# Patient Record
Sex: Male | Born: 1955 | Race: White | Hispanic: No | Marital: Married | Smoking: Former smoker
Health system: Southern US, Community
[De-identification: ages and names within clinical notes are randomized; demographics above are authoritative.]

## PROBLEM LIST (undated history)

## (undated) DIAGNOSIS — I219 Acute myocardial infarction, unspecified: Secondary | ICD-10-CM

## (undated) DIAGNOSIS — Z95810 Presence of automatic (implantable) cardiac defibrillator: Secondary | ICD-10-CM

## (undated) DIAGNOSIS — G1221 Amyotrophic lateral sclerosis: Secondary | ICD-10-CM

## (undated) DIAGNOSIS — I509 Heart failure, unspecified: Secondary | ICD-10-CM

## (undated) DIAGNOSIS — I10 Essential (primary) hypertension: Secondary | ICD-10-CM

## (undated) DIAGNOSIS — I519 Heart disease, unspecified: Secondary | ICD-10-CM

## (undated) DIAGNOSIS — I2510 Atherosclerotic heart disease of native coronary artery without angina pectoris: Secondary | ICD-10-CM

## (undated) DIAGNOSIS — N189 Chronic kidney disease, unspecified: Secondary | ICD-10-CM

## (undated) DIAGNOSIS — E785 Hyperlipidemia, unspecified: Secondary | ICD-10-CM

## (undated) DIAGNOSIS — I2589 Other forms of chronic ischemic heart disease: Secondary | ICD-10-CM

## (undated) DIAGNOSIS — C679 Malignant neoplasm of bladder, unspecified: Secondary | ICD-10-CM

## (undated) DIAGNOSIS — I209 Angina pectoris, unspecified: Secondary | ICD-10-CM

## (undated) HISTORY — DX: Essential (primary) hypertension: I10

## (undated) HISTORY — DX: Amyotrophic lateral sclerosis: G12.21

## (undated) HISTORY — DX: Other forms of chronic ischemic heart disease: I25.89

## (undated) HISTORY — DX: Hyperlipidemia, unspecified: E78.5

## (undated) HISTORY — PX: CARDIAC CATHETERIZATION: SHX172

## (undated) HISTORY — PX: OTHER SURGICAL HISTORY: SHX169

## (undated) HISTORY — DX: Atherosclerotic heart disease of native coronary artery without angina pectoris: I25.10

## (undated) HISTORY — DX: Heart failure, unspecified: I50.9

---

## 2001-03-29 DIAGNOSIS — I219 Acute myocardial infarction, unspecified: Secondary | ICD-10-CM

## 2001-03-29 HISTORY — DX: Acute myocardial infarction, unspecified: I21.9

## 2001-05-28 ENCOUNTER — Inpatient Hospital Stay (HOSPITAL_COMMUNITY): Admission: EM | Admit: 2001-05-28 | Discharge: 2001-06-04 | Payer: Self-pay

## 2004-08-28 ENCOUNTER — Ambulatory Visit: Payer: Self-pay | Admitting: Cardiology

## 2004-09-02 ENCOUNTER — Ambulatory Visit: Payer: Self-pay | Admitting: Cardiovascular Disease

## 2004-12-04 ENCOUNTER — Ambulatory Visit: Payer: Self-pay | Admitting: Cardiology

## 2005-07-28 ENCOUNTER — Ambulatory Visit: Payer: Self-pay | Admitting: Cardiology

## 2005-08-06 ENCOUNTER — Ambulatory Visit: Payer: Self-pay

## 2005-08-06 ENCOUNTER — Ambulatory Visit: Payer: Self-pay | Admitting: Cardiology

## 2006-08-03 ENCOUNTER — Ambulatory Visit: Payer: Self-pay | Admitting: Cardiology

## 2006-08-03 LAB — CONVERTED CEMR LAB
ALT: 23 units/L (ref 0–40)
AST: 29 units/L (ref 0–37)
Albumin: 3.9 g/dL (ref 3.5–5.2)
Alkaline Phosphatase: 57 units/L (ref 39–117)
BUN: 14 mg/dL (ref 6–23)
Bilirubin, Direct: 0.3 mg/dL (ref 0.0–0.3)
CO2: 26 meq/L (ref 19–32)
Calcium: 9.2 mg/dL (ref 8.4–10.5)
Chloride: 111 meq/L (ref 96–112)
Cholesterol: 135 mg/dL (ref 0–200)
Creatinine, Ser: 1.1 mg/dL (ref 0.4–1.5)
GFR calc Af Amer: 90 mL/min
GFR calc non Af Amer: 74 mL/min
Glucose, Bld: 92 mg/dL (ref 70–99)
HDL: 44.7 mg/dL (ref >39.0)
LDL Cholesterol: 71 mg/dL (ref 0–99)
Potassium: 4.6 meq/L (ref 3.5–5.1)
Sodium: 143 meq/L (ref 135–145)
Total Bilirubin: 1.3 mg/dL — ABNORMAL HIGH (ref 0.3–1.2)
Total CHOL/HDL Ratio: 3
Total Protein: 7.2 g/dL (ref 6.0–8.3)
Triglycerides: 96 mg/dL (ref 0–149)
VLDL: 19 mg/dL (ref 0–40)

## 2006-10-12 ENCOUNTER — Ambulatory Visit: Payer: Self-pay

## 2006-12-05 ENCOUNTER — Ambulatory Visit: Payer: Self-pay | Admitting: Internal Medicine

## 2007-08-24 ENCOUNTER — Ambulatory Visit: Payer: Self-pay | Admitting: Cardiology

## 2007-08-24 LAB — CONVERTED CEMR LAB
ALT: 28 units/L (ref 0–53)
AST: 28 units/L (ref 0–37)
Albumin: 3.8 g/dL (ref 3.5–5.2)
Alkaline Phosphatase: 66 units/L (ref 39–117)
BUN: 13 mg/dL (ref 6–23)
Bilirubin, Direct: 0.1 mg/dL (ref 0.0–0.3)
CO2: 27 meq/L (ref 19–32)
Calcium: 9.4 mg/dL (ref 8.4–10.5)
Chloride: 101 meq/L (ref 96–112)
Cholesterol: 137 mg/dL (ref 0–200)
Creatinine, Ser: 1 mg/dL (ref 0.4–1.5)
GFR calc Af Amer: 100 mL/min
GFR calc non Af Amer: 83 mL/min
Glucose, Bld: 98 mg/dL (ref 70–99)
HDL: 33.4 mg/dL — ABNORMAL LOW (ref >39.0)
LDL Cholesterol: 80 mg/dL (ref 0–99)
Potassium: 3.9 meq/L (ref 3.5–5.1)
Sodium: 138 meq/L (ref 135–145)
Total Bilirubin: 1.1 mg/dL (ref 0.3–1.2)
Total CHOL/HDL Ratio: 4.1
Total Protein: 7.1 g/dL (ref 6.0–8.3)
Triglycerides: 119 mg/dL (ref 0–149)
VLDL: 24 mg/dL (ref 0–40)

## 2007-08-30 ENCOUNTER — Ambulatory Visit: Payer: Self-pay

## 2008-03-29 HISTORY — PX: HERNIA REPAIR: SHX51

## 2008-11-20 DIAGNOSIS — E785 Hyperlipidemia, unspecified: Secondary | ICD-10-CM | POA: Insufficient documentation

## 2008-11-20 DIAGNOSIS — I10 Essential (primary) hypertension: Secondary | ICD-10-CM

## 2008-11-20 DIAGNOSIS — I509 Heart failure, unspecified: Secondary | ICD-10-CM | POA: Insufficient documentation

## 2008-11-20 DIAGNOSIS — I2510 Atherosclerotic heart disease of native coronary artery without angina pectoris: Secondary | ICD-10-CM | POA: Insufficient documentation

## 2008-11-25 ENCOUNTER — Encounter (INDEPENDENT_AMBULATORY_CARE_PROVIDER_SITE_OTHER): Payer: Self-pay | Admitting: *Deleted

## 2009-01-10 ENCOUNTER — Telehealth: Payer: Self-pay | Admitting: Cardiology

## 2009-03-19 ENCOUNTER — Telehealth: Payer: Self-pay | Admitting: Cardiology

## 2009-07-09 ENCOUNTER — Encounter: Payer: Self-pay | Admitting: Cardiology

## 2009-08-21 ENCOUNTER — Ambulatory Visit: Payer: Self-pay | Admitting: Cardiology

## 2009-08-21 DIAGNOSIS — I428 Other cardiomyopathies: Secondary | ICD-10-CM | POA: Insufficient documentation

## 2009-08-21 DIAGNOSIS — I2589 Other forms of chronic ischemic heart disease: Secondary | ICD-10-CM

## 2010-01-28 ENCOUNTER — Encounter: Payer: Self-pay | Admitting: Cardiology

## 2010-01-30 ENCOUNTER — Encounter: Payer: Self-pay | Admitting: Gastroenterology

## 2010-02-10 ENCOUNTER — Telehealth: Payer: Self-pay | Admitting: Gastroenterology

## 2010-02-25 ENCOUNTER — Telehealth: Payer: Self-pay | Admitting: Cardiology

## 2010-02-27 ENCOUNTER — Encounter: Payer: Self-pay | Admitting: Cardiology

## 2010-03-04 ENCOUNTER — Telehealth (INDEPENDENT_AMBULATORY_CARE_PROVIDER_SITE_OTHER): Payer: Self-pay

## 2010-03-05 ENCOUNTER — Ambulatory Visit: Payer: Self-pay

## 2010-03-05 ENCOUNTER — Encounter: Payer: Self-pay | Admitting: Internal Medicine

## 2010-03-05 ENCOUNTER — Encounter (HOSPITAL_COMMUNITY)
Admission: RE | Admit: 2010-03-05 | Discharge: 2010-04-28 | Payer: Self-pay | Source: Home / Self Care | Attending: Cardiology | Admitting: Cardiology

## 2010-03-12 ENCOUNTER — Ambulatory Visit: Admission: RE | Admit: 2010-03-12 | Payer: Self-pay | Source: Home / Self Care | Admitting: Surgery

## 2010-03-16 ENCOUNTER — Ambulatory Visit (HOSPITAL_COMMUNITY)
Admission: RE | Admit: 2010-03-16 | Discharge: 2010-03-16 | Payer: Self-pay | Source: Home / Self Care | Attending: Surgery | Admitting: Surgery

## 2010-03-16 ENCOUNTER — Ambulatory Visit (HOSPITAL_COMMUNITY): Admission: RE | Admit: 2010-03-16 | Payer: Self-pay | Source: Home / Self Care | Admitting: Surgery

## 2010-04-03 ENCOUNTER — Encounter: Payer: Self-pay | Admitting: Cardiology

## 2010-04-30 NOTE — Progress Notes (Signed)
Summary: Nuc. Pre Procedure  Phone Note Outgoing Call Call back at Home Phone [phone]   Call placed by: Irean Hong, RN,  March 04, 2010 2:29 PM Summary of Call: Left message with information on Myoview Information Sheet (see scanned document for details).      Nuclear Med Background Indications for Stress Test: Evaluation for Ischemia, Surgical Clearance, PTCA Patency  Indications Comments: Pending hernia repair by Dr. Cyndia Bent.  History: Angioplasty, Echo, Heart Catheterization, Myocardial Infarction, Myocardial Perfusion Study  History Comments: '03 AWMI>Cath>PTCA x2 LAD,Diag. '03 Echo: EF=35-45%. '07 Muga: EF=40%. 7/08MPS: Large anterapical and anteroseptal infarct,(-) ischemia,EF=33%. Hx. ICM     Nuclear Pre-Procedure Cardiac Risk Factors: History of Smoking, Hypertension, Lipids Height (in): 68   Appended Document: Cardiology Nuclear Testing Left message to call back

## 2010-04-30 NOTE — Assessment & Plan Note (Signed)
Summary: Cardiology Nuclear Testing  Nuclear Med Background Indications for Stress Test: Evaluation for Ischemia, Surgical Clearance, PTCA Patency  Indications Comments: Pending hernia repair by Dr. Cyndia Bent.  History: Angioplasty, Echo, Heart Catheterization, Myocardial Infarction, Myocardial Perfusion Study  History Comments: '03 AWMI>Cath>PTCA x2 LAD,Diag. '03 Echo: EF=35-45%. '07 Muga: EF=40%. 7/08MPS: Large anterapical and anteroseptal infarct,(-) ischemia,EF=33%. Hx. ICM     Nuclear Pre-Procedure Cardiac Risk Factors: History of Smoking, Hypertension, Lipids Caffeine/Decaff Intake: None NPO After: 5:30 AM Lungs: clear IV 0.9% NS with Angio Cath: 22g     IV Site: R Antecubital IV Started by: Bonnita Levan, RN Chest Size (in) 40     Height (in): 68 Weight (lb): 172 BMI: 26.25  Nuclear Med Study 1 or 2 day study:  1 day     Stress Test Type:  Stress Reading MD:  Arvilla Meres, MD     Referring MD:  B.Crenshaw Resting Radionuclide:  Technetium 22m Tetrofosmin     Resting Radionuclide Dose:  11 mCi  Stress Radionuclide:  Technetium 56m Tetrofosmin     Stress Radionuclide Dose:  33 mCi   Stress Protocol Exercise Time (min):  7:30 min     Max HR:  151 bpm     Predicted Max HR:  164 bpm  Max Systolic BP: 150 mm Hg     Percent Max HR:  92.07 %     METS: 9.3 Rate Pressure Product:  16109    Stress Test Technologist:  Milana Na, EMT-P     Nuclear Technologist:  Doyne Keel, CNMT  Rest Procedure  Myocardial perfusion imaging was performed at rest 45 minutes following the intravenous administration of Technetium 88m Tetrofosmin.  Stress Procedure  The patient exercised for 7:30. The patient stopped due to fatigue and denied any chest pain.  There were + significant ST-T wave changes.  Technetium 3m Tetrofosmin was injected at peak exercise and myocardial perfusion imaging was performed after a brief delay.  QPS Raw Data Images:  Normal; no motion artifact;  dilated LV Stress Images:  Markedly decreased uptake in the anterior, antero-apical and anteroseptal walls Rest Images:  Markedly decreased uptake in the anterior, antero-apical and anteroseptal walls Subtraction (SDS):  Previous large anterior, antero-apical and anteroseptal infarct. No ischemia. Transient Ischemic Dilatation:  1.10  (Normal <1.22)  Lung/Heart Ratio:  .35  (Normal <0.45)  Quantitative Gated Spect Images QGS EDV:  171 ml QGS ESV:  124 ml QGS EF:  27 % QGS cine images:  Previous large anterior, antero-apical and anteroseptal infarct. Apical dyskinesis.   Findings Abormal nuclear study  Evidence for anterior (septal apical) infarct  Evidence for LV Dysfunction LV Dysfunction    Overall Impression  Exercise Capacity: Fair exercise capacity. BP Response: Normal blood pressure response. Clinical Symptoms: No chest pain ECG Impression: No significant ST segment change suggestive of ischemia. Overall Impression: Abnormal stress nuclear study. Overall Impression Comments: Previous large anterior, antero-apical and anteroseptal infarct. No ischemia. LV is dilated. EF 27%.  Appended Document: Cardiology Nuclear Testing Would ask pt to f/u with Dr. Ladona Ridgel for consideration of ICD  Appended Document: Cardiology Nuclear Testing Left message to call back    Appended Document: Cardiology Nuclear Testing Left message to call back    Appended Document: Cardiology Nuclear Testing pt aware of results, he will call after surgery to get appt with dr taylor

## 2010-04-30 NOTE — Assessment & Plan Note (Signed)
Summary: rov per pt call/lg    Primary Provider:  Dr. Christell Constant   History of Present Illness: [name] [name] is a pleasant gentleman who has a history of coronary artery disease.  He had an acute anterior infarct in 2003.  At that time, he had PCI of his LAD and diagonal.  Note, the Lcx and RCA had no disease. His last myoview on October 12, 2006, his ejection fraction was noted to be 33%.  There was a large anteroapical and anteroseptal infarct, but there was no ischemia.  We did refer him to Dr. Ladona Ridgel for consideration of ICD.  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  He declined it at that point.  Carotid Dopplers in June of 2009 showed normal carotids. I last saw him in May 2009. Since then, the patient denies any dyspnea on exertion, orthopnea, PND, pedal edema, palpitations, syncope or chest pain. He also has an inguinal hernia. We were also asked to evaluate preoperatively.   Current Medications (verified): 1)  Aspirin Ec 325 Mg Tbec (Aspirin) .... Take One Tablet By Mouth Daily 2)  Altace 5 Mg Caps (Ramipril) .Marland Kitchen.. 1 Tab By Mouth Two Times A Day 3)  Carvedilol 6.25 Mg Tabs (Carvedilol) .... Take One Tablet By Mouth Twice A Day 4)  Crestor 20 Mg Tabs (Rosuvastatin Calcium) .... Take One Tablet By Mouth Daily.  Past History:  Past Medical History: HYPERTENSION (ICD-401.9) HYPERLIPIDEMIA (ICD-272.4) CARDIOMYOPATHY (ICD-425.4) CAD (ICD-414.00)  Past Surgical History: Previous ear surgery  Social History: Reviewed history from 11/20/2008 and no changes required. The patient works as an Psychologist, clinical.  He denies tobacco   or ethanol abuse at present.   Review of Systems       no fevers or chills, productive cough, hemoptysis, dysphasia, odynophagia, melena, hematochezia, dysuria, hematuria, rash, seizure activity, orthopnea, PND, pedal edema, claudication. Remaining systems are negative.   Vital Signs:  Patient profile:   55 year  old male Height:      68 inches Weight:      176 pounds BMI:     26.86 Pulse rate:   63 / minute Resp:     12 per minute BP sitting:   122 / 80  (left arm)  Vitals Entered By: Kem Parkinson (Aug 21, 2009 3:59 PM)  Physical Exam  General:  Well-developed well-nourished in no acute distress.  Skin is warm and dry.  HEENT is normal.  Neck is supple. No thyromegaly.  Chest is clear to auscultation with normal expansion.  Cardiovascular exam is regular rate and rhythm.  Abdominal exam nontender or distended. No masses palpated. Extremities show no edema. neuro grossly intact    EKG  Procedure date:  08/21/2009  Findings:      Sinus rhythm at a rate of 63. Axis normal.  Prior septal infarct.  Impression & Recommendations:  Problem # 1:  CARDIOMYOPATHY, ISCHEMIC (ICD-414.8) Continue aspirin, ACE inhibitor, beta blocker and statin. Repeat Myoview both to exclude ischemia and to quantitate LV function. If ejection fraction less than or equal to 35% then I would recommend ICD. He is somewhat hesitant about this but states he would consider it. I discussed the risk of sudden death. His updated medication list for this problem includes:    Aspirin Ec 325 Mg Tbec (Aspirin) .Marland Kitchen... Take one tablet by mouth daily    Altace 5 Mg Caps (Ramipril) .Marland Kitchen... 1 tab by mouth two times a day  Carvedilol 6.25 Mg Tabs (Carvedilol) .Marland Kitchen... Take one tablet by mouth twice a day  Problem # 2:  PREOPERATIVE EXAMINATION (ICD-V72.84) Plan Myoview. If no ischemia or low risk then I think it would be safe for him to proceed.  Problem # 3:  HYPERTENSION (ICD-401.9) Blood pressure controlled on present medications. Will continue. Check bmet. His updated medication list for this problem includes:    Aspirin Ec 325 Mg Tbec (Aspirin) .Marland Kitchen... Take one tablet by mouth daily    Altace 5 Mg Caps (Ramipril) .Marland Kitchen... 1 tab by mouth two times a day    Carvedilol 6.25 Mg Tabs (Carvedilol) .Marland Kitchen... Take one tablet by mouth  twice a day  Problem # 4:  HYPERLIPIDEMIA (ICD-272.4) Continue statin. Check lipids and liver. His updated medication list for this problem includes:    Crestor 20 Mg Tabs (Rosuvastatin calcium) .Marland Kitchen... Take one tablet by mouth daily.  Problem # 5:  CAD (ICD-414.00) Continue present medications as well as risk factor modification. His updated medication list for this problem includes:    Aspirin Ec 325 Mg Tbec (Aspirin) .Marland Kitchen... Take one tablet by mouth daily    Altace 5 Mg Caps (Ramipril) .Marland Kitchen... 1 tab by mouth two times a day    Carvedilol 6.25 Mg Tabs (Carvedilol) .Marland Kitchen... Take one tablet by mouth twice a day  Orders: Nuclear Stress Test (Nuc Stress Test)  Patient Instructions: 1)  Your physician recommends that you schedule a follow-up appointment in: ONE YEAR 2)  Your physician recommends that you return for lab work WJ:[id number] STRESS TEST-LIPID/LIVER/BMP-272.0/V58.69/414.8 3)  Your physician has requested that you have an exercise stress myoview.  For further information please visit [email or web address]  Please follow instruction sheet, as given.

## 2010-04-30 NOTE — Progress Notes (Signed)
Summary: Office Visit  Office Visit   Imported By: Nosson Many 09/03/2009 14:52:33  _____________________________________________________________________  External Attachment:    Type:   Image     Comment:   External Document

## 2010-04-30 NOTE — Letter (Signed)
Summary: Pre Visit Letter Revised  [facility]  [address] [place], [place] [address]   Phone: [phone]  Fax: [phone]        01/30/2010 MRN: [id number]  [name] [name] [address] [place], [place]  [address]             Procedure Date:  12-21 at 9:30am           Dr Nedra Hai to the Gastroenterology Division at [facility].    You are scheduled to see a nurse for your pre-procedure visit on 03-04-10 at  3pm on the 3rd floor at [facility], [address].  We ask that you try to arrive at our office 15 minutes prior to your appointment time to allow for check-in.  Please take a minute to review the attached form.  If you answer "Yes" to one or more of the questions on the first page, we ask that you call the person listed at your earliest opportunity.  If you answer "No" to all of the questions, please complete the rest of the form and bring it to your appointment.    Your nurse visit will consist of discussing your medical and surgical history, your immediate family medical history, and your medications.   If you are unable to list all of your medications on the form, please bring the medication bottles to your appointment and we will list them.  We will need to be aware of both prescribed and over the counter drugs.  We will need to know exact dosage information as well.    Please be prepared to read and sign documents such as consent forms, a financial agreement, and acknowledgement forms.  If necessary, and with your consent, a friend or relative is welcome to sit-in on the nurse visit with you.  Please bring your insurance card so that we may make a copy of it.  If your insurance requires a referral to see a specialist, please bring your referral form from your primary care physician.  No co-pay is required for this nurse visit.     If you cannot keep your appointment, please call [phone] to cancel or reschedule prior to your  appointment date.  This allows [place] the opportunity to schedule an appointment for another patient in need of care.    Thank you for choosing [facility] for your medical needs.  We appreciate the opportunity to care for you.  Please visit [place] at our website  to learn more about our practice.  Sincerely, The Gastroenterology Division

## 2010-04-30 NOTE — Progress Notes (Signed)
Summary: Question   Phone Note Call from Patient Call back at [phone]   Caller: Patient Call For: Dr. Arlyce Dice Reason for Call: Talk to Nurse Details for Reason: Question Summary of Call: Pt. has previsit & colon scheduled.  He has an upcoming visit with his surgeon 12/2 to schedule hernia repair.  Questioned whether or not this would interfere with colonoscopy or whether he should put it off until after his hernia surgery. Please call and advise. Initial call taken by: Schuyler Amor,  February 10, 2010 11:26 AM  Follow-up for Phone Call        I have left a message for the patient that he should see the surgeon and if they can schedule his hernia repair prior to the colon then he can reschedule at his recommendations.  I have asked him to call back if he has any questions. Follow-up by: Darcey Nora RN, CGRN,  February 10, 2010 1:44 PM

## 2010-04-30 NOTE — Progress Notes (Signed)
Summary: dr streck's office re cardiac clearance   Phone Note From Other Clinic   Caller: dr streck's office [phone] Summary of Call: calling to see if we have done a cardiac clearance for pt's hernia surgery, also need to know how to manage his blood thinners pls fax to [phone] Initial call taken by: Glynda Jaeger,  February 25, 2010 10:42 AM  Follow-up for Phone Call        pt was seen by dr Jens Som for surgical clearence in may and a myoview was requested.and the pt cancelled. per dr Jens Som pt will need a myoview before can give clearence. dr streck's office aware. left message for pt to call back to disucss Deliah Goody, RN  February 25, 2010 11:05 AM   Additional Follow-up for Phone Call Additional follow up Details #1::        spoke with pt, myoview scheduled for 03-05-10 foer surgical clearence Deliah Goody, RN  February 26, 2010 10:29 AM

## 2010-04-30 NOTE — Letter (Signed)
Summary: [facility]- Office Visit  [facility]- Office Visit   Imported By: Marylou Mccoy 04/06/2010 15:36:33  _____________________________________________________________________  External Attachment:    Type:   Image     Comment:   External Document

## 2010-05-06 NOTE — Letter (Signed)
Summary: Dr Reola Mosher Streck's Office Note   Dr Reola Mosher Streck's Office Note   Imported By: Roderic Ovens 05/01/2010 12:40:23  _____________________________________________________________________  External Attachment:    Type:   Image     Comment:   External Document

## 2010-06-08 LAB — BASIC METABOLIC PANEL
BUN: 6 mg/dL (ref 6–23)
CO2: 28 mEq/L (ref 19–32)
Calcium: 9.4 mg/dL (ref 8.4–10.5)
Chloride: 104 mEq/L (ref 96–112)
Creatinine, Ser: 0.92 mg/dL (ref 0.4–1.5)
GFR calc Af Amer: >60 mL/min (ref >60)
GFR calc non Af Amer: >60 mL/min (ref >60)
Glucose, Bld: 110 mg/dL — ABNORMAL HIGH (ref 70–99)
Potassium: 3.9 mEq/L (ref 3.5–5.1)
Sodium: 139 mEq/L (ref 135–145)

## 2010-06-08 LAB — CBC
HCT: 42.6 % (ref 39.0–52.0)
MCH: 31.5 pg (ref 26.0–34.0)
MCHC: 34 g/dL (ref 30.0–36.0)
MCV: 92.4 fL (ref 78.0–100.0)
Platelets: 214 10*3/uL (ref 150–400)
RDW: 12.7 % (ref 11.5–15.5)
WBC: 5.3 10*3/uL (ref 4.0–10.5)

## 2010-08-11 NOTE — Assessment & Plan Note (Signed)
[facility]                            CARDIOLOGY OFFICE NOTE   NAME:[name] [name], [name] [name]                       MRN:          [id number]  DATE:08/24/2007                            DOB:          December 06, 1955    [name] [name] is a 55 year old gentleman who has a history of coronary  artery disease.  He had an acute anterior infarct in 2003.  At that  time, he had PCI of his LAD and diagonal.  When I last saw him on October 12, 2006, his ejection fraction was noted to be 33%.  There was a large  anteroapical and anteroseptal infarct, but there was no ischemia.  We  did refer him to Dr. Ladona Ridgel for consideration of ICD.  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  He declined it at that point.  Since I last saw him, he has  had an occasional tingling in his hands bilaterally.  However, there has  been no weakness or loss of sensation in his extremities.  There is no  dysarthria.  He occasionally has mild dyspnea on exertion but there is  no orthopnea, PND, or pedal edema.  He has not had chest pain,  palpitations, or syncope.  His medications include aspirin 325 mg p.o.  daily, Altace 5 mg p.o. b.i.d., Coreg 6.25 mg p.o. b.i.d., and Vytorin  10/40 mg daily.   PHYSICAL EXAMINATION:  VITAL SIGNS:  Blood pressure 118/80, and pulse of  61, and weight is 177 pounds.  HEENT:  Normal.  NECK:  Supple with no bruits.  CHEST:  Clear.  CARDIOVASCULAR:  Regular rhythm.  ABDOMEN:  No tenderness.  EXTREMITIES:  No edema.   LABS:  Electrocardiogram shows a sinus rhythm at a rate of 61.  There is  a prior septal infarct.  It is unchanged from previous.   DIAGNOSES:  1. Coronary artery disease, status post percutaneous coronary      intervention of the left anterior descending and diagonal - he will      continue on his aspirin, Vytorin, Coreg and ACE inhibitor.  He will      continue with diet and exercise.  He does not  smoke.  2. Ischemic cardiomyopathy - his ejection fraction was less than 35%      on recent evaluation.  We again discussed the possibility of an      implantable cardioverter-defibrillator.  However, he is concerned      about his insurance and time off from work.  I explained the risk      of sudden death.  He will contact us if and when he is agreeable to      an implantable cardioverter-defibrillator.  3. Hypertension - his blood pressure is controlled on his present      medications.  We will check BMET to follow his potassium and renal      function.  4. Hyperlipidemia - we will continue on his statin, and we will check      lipids  and liver and adjust as indicated.  5. Numbness in his upper extremities - we will check carotid Dopplers,      although this does not      necessarily sound to be a transient ischemic attack.  6. We will see him back in 1 year.     [name] Jens Som, MD, [facility]  Electronically Signed    BSC/MedQ  DD: 08/24/2007  DT: 08/24/2007  Job #: [id number]

## 2010-08-11 NOTE — Assessment & Plan Note (Signed)
[facility]                         ELECTROPHYSIOLOGY OFFICE NOTE   NAME:Minteer, BRYSYN BRANDENBERGER                       MRN:          [id number]  DATE:12/05/2006                            DOB:          29-Nov-1955    [name] is referred today for evaluation.  He is a very pleasant  middle-aged man with an ischemic cardiomyopathy status post myocardial  infarction with class I to II congestive heart failure.  The patient has  never had syncope.  He recently underwent stress Myoview study which  demonstrated a gated EF of 33% and is referred now for consideration for  prophylactic ICD insertion.  The patient has never had syncope.  He  denies palpitations or chest pain.  His heart failure has been very well  controlled.   MEDICATIONS:  1. Enteric-coated aspirin 325 mg a day.  2. Altace 5 mg twice a day.  3. Coreg 6.25 mg twice daily.  4. Vytorin.   PAST MEDICAL HISTORY:  Notable for dyslipidemia.   FAMILY HISTORY:  Positive for coronary artery disease.   SOCIAL HISTORY:  The patient works as an Psychologist, clinical.  He denies tobacco  or ethanol abuse at present.   REVIEW OF SYSTEMS:  Notable for minimal amounts of arthritis, otherwise  all systems were reviewed and negative except as noted in the HPI.   PHYSICAL EXAMINATION:  GENERAL:  He is a pleasant, well appearing,  middle-aged man in no acute distress.  VITAL SIGNS:  Blood pressure 110/82, pulse 76 and regular, respirations  16, weight 181 pounds.  HEENT:  Normocephalic and atraumatic.  Pupils equal, round, and reactive  to light.  Oropharynx was moist.  Sclerae anicteric.  NECK:  No jugular venous distention.  There was no thyromegaly.  Trachea  was midline.  Carotids were 2+ and symmetric.  LUNGS:  Clear to auscultation bilaterally.  No wheezes, rales, or  rhonchi were present.  There is no increased work of breathing.  CARDIOVASCULAR:  Regular rate and rhythm with normal S1 and S2.  PMI was  enlarged and laterally displaced.  There was no S3 or S4 gallop  appreciated.  ABDOMEN:  Soft, nontender, and nondistended.  No organomegaly.  Bowel  sounds present.  No rebound or guarding.  EXTREMITIES:  No cyanosis, clubbing, or edema.  Pulses 2+ and symmetric.  NEUROLOGY:  Alert and oriented x3.  Cranial nerves II-XII grossly  intact.  The strength is 5/5 and symmetric.   EKG demonstrates sinus rhythm.   IMPRESSION:  1. Ischemic cardiomyopathy.  2. Congestive heart failure class I to II.   DISCUSSION:  I have discussed treatment options with the patient.  The  risks and benefits, goals and expectations of prophylactic ICD insertion  have been discussed with the patient.  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  Because of this, he would not be able to continue his job  and have a defibrillator.  Despite his risks for  sudden death, the  patient would prefer to continue working at the present time.  I have  instructed him that should he have any high risk features including  syncope or sustained tachypalpitations associated with dizziness or  lightheadedness that he should call us, or go to the emergency room.     Doylene Canning. Ladona Ridgel, MD  Electronically Signed    GWT/MedQ  DD: 12/05/2006  DT: 12/06/2006  Job #: [id number]   cc:   [name], MD, [facility]  Ernestina Penna, M.D.

## 2010-08-14 NOTE — Cardiovascular Report (Signed)
[name]. [facility]  Patient:    [name] [name], [name] [name] Visit Number: [id number] MRN: [id number]          Service Type: MED Location: CCUA 2930 01 Attending Physician:  [name] [name] Dictated by:   [name] [name], M.D. [facility] Proc. Date: 05/28/01 Admit Date:  05/28/2001   CC:         [name] [name]. [name] [name], M.D. [facility]  Cardiopulmonary Laboratory   Cardiac Catheterization  PROCEDURES PERFORMED: Cardiac catheterization and percutaneous coronary intervention.  CLINICAL HISTORY: The patient is 55 years old and had no prior history of known heart disease. At 11 this morning, he developed severe chest pain and came to the emergency room where his ECG showed an acute anterior wall myocardial infarction. He was seen by Dr. Jens [name] and taken to the catheterization laboratory for angiography and probable intervention. He was consent to the [facility] trial.  DESCRIPTION OF PROCEDURE: The procedure was performed via the right femoral artery using an arterial sheath and 6 French preformed coronary catheters.  A front wall arterial puncture was performed and Omnipaque contrast was used. The patient was given weight-adjusted heparin to prolong the ACT of greater than 200 seconds. He was given abciximab bolus and infusion, although this did not get started until after the first aspiration with the Export catheter.  We used a 7 [place] guiding catheter with side holes. The patient was randomized to distal protection with the GuardWire balloon. We passed the GuardWire balloon across the lesion and still only had TIMI-1 flow. We performed two aspirations with the Export catheter and still this only had TIMI-1 flow. We then inflated the PercuSurge balloon and went in with a 2.25 x 20 mm CrossSail and performed one inflation of 8 atmospheres for 45 seconds. We then removed the balloon and used the Export catheter to aspirate the area of dilatation. We performed three  aspirations. We then deflated the PercuSurge balloon. This reestablished TIMI-3 flow and recognized there was a branch stenosis with a high-grade lesion in the diagonal branch. We then wired the diagonal branch before performing repeat balloon inflations. We then went back in with a 2.5 x 20 mm CrossSail. We attempted to inflate the GuardWire balloon, but the GuardWire balloon would not inflate. We went ahead and proceeded with dilatation of the LAD performing two inflations of 8 and 10 atmospheres for 50 and 33 seconds. We then removed the balloon and went back in with the same balloon which was a 2.5 x 20 mm CrossSail and dilated in the diagonal lesion with two inflations of 8 atmospheres for 32 seconds each. We were left with a suboptimal result and possible filling defect and elected to pass the Export catheter into the diagonal branch and we performed one aspiration with several movements across the lesion. Repeat diagnostic study did not show too much additional improvement after aspiration with the Export catheter. We finally went back in with a 3.0 x 20 mm Quantum Maverick and performed two inflations of 8 and 9 atmospheres for 52 and 60 seconds in the LAD. Repeat diagnostic studies were then performed through the guiding catheter. Once again, the final dilatations were not done with distal protection because of the GuardWire balloon would not inflate. We did obtain a moderate amount of thrombus from the aspirations after the initial balloon dilatation, and a very small amount of thrombus on the initial aspirations before any balloon inflations.  The patient had quite a bit of pain during the first  part of the procedure, but otherwise tolerated the procedure well and left the laboratory in satisfactory condition.  RESULTS: The left main coronary artery: The left main coronary artery was free of significant disease.  Left anterior descending: The left anterior descending artery  was completely occluded about 1.5 to 2 cm from its origin.  Circumflex artery: The circumflex artery gave rise to a small marginal branch and then a large second marginal branch. These vessels were free of significant disease.  Right coronary artery: The right coronary is a large dominant vessel that gave rise to a posterior descending branch and three posterolateral branches. These vessels were free of significant disease.  LEFT VENTRICULOGRAPHY: The left ventriculogram performed in the RAO projection showed akinesis of the anterolateral wall down to the tip of the apex. The inferior wall moved well. The estimated ejection fraction was 35%.  Following PTCA of the LAD, the stenosis improved from 100% to 30%. Following PTCA of the diagonal branch to the LAD, the stenosis improved from 100% to 80%.  The flow improved in both vessels from TIMI-0 to TIMI-3 flow.  The left ventricular pressure was 112/29 and the aortic pressure was 112/78 with a mean of 95.  The patient had the onset of chest pain at 11 a.m. and arrived in the emergency room at 11:33 a.m. The first balloon inflation was at 1312 hours. This gave a door-to-balloon time of 1 hour and 39 minutes, and a reperfusion time of 2 hours and 12 minutes.  CONCLUSIONS: 1. Acute anterior wall myocardial infarction with total occlusion of the    proximal left anterior descending, no significant obstruction in the    circumflex and right coronary artery and anterolateral wall and apical wall    akinesis with an estimated ejection fraction of 35%. 2. Successful balloon dilatation of the left anterior descending artery using    distal protection with PercuSurge (EMERALD trial) with improvement in    percent diameter narrowing from 100% to 30% and improvement in the flow    from TIMI-0 to TIMI-3 flow. 3. Reperfusion of the diagonal branch of the left anterior descending (branch    stenosis), but with suboptimal percutaneous transluminal  coronary     angioplasty result with improvement in percent diameter narrowing from    100% to 80% with improvement in the flow from TIMI-0 to TIMI-3 flow.  DISPOSITION: The patient was returned to the postangioplasty unit for further observation. We will plan to bring the patient back later next week to take another look at the artery and decide if further intervention is needed. Currently, there is residual disease in the diagonal branch and the LAD was not stented. Dictated by:   [name] [name], M.D. [facility] Attending Physician:  [name] [name] DD:  05/28/01 TD:  05/29/01 Job: [id number]

## 2010-08-14 NOTE — Assessment & Plan Note (Signed)
[facility]                            CARDIOLOGY OFFICE NOTE   NAME:[name], [name] SHEDDEN                       MRN:          [id number]  DATE:08/03/2006                            DOB:          05/23/1955    [name] [name] is a very pleasant 55 year old gentleman with a past medical  history of coronary disease, ischemic cardiomyopathy, as well as  hyperlipidemia and hypertension.  Since I last saw him, he is doing  extremely well.  He is exercising routinely, walking 4-5 times per week  30-35 minutes at a time.  He is also trying to follow a diet.  He does  not smoke.  Note: His cardiac history dates back to March 2003 when he  suffered a myocardial infarction and had PCI of his LAD.  He also had  PCI of a diagonal.  His most recent MUGA was performed in May 2007.  At  that time his ejection fraction was found to be 40%.  Since I saw him,  there has been no chest pain, dyspnea, palpitations, syncope, or pedal  edema.   CURRENT MEDICATIONS:  1. Aspirin 325 mg p.o. daily.  2. Altace 5 mg p.o. b.i.d.  3. Coreg 6.25 mg p.o. b.i.d.  4. Vytorin 10/40 daily.   PHYSICAL EXAMINATION:  VITAL SIGNS: Blood pressure 110/64, pulse 52.  He  weighs 174 pounds.  NECK:  Supple.  There are no bruits noted.  CHEST:  Clear.  CARDIOVASCULAR:  Bradycardic rate but regular rhythm.  ABDOMEN:  Exam shows no pulsatile mass and no bruits.  EXTREMITIES:  Show no edema.   Electrocardiogram shows a sinus rhythm at a rate of 52.  There is a  prior septal infarct with T wave inversions in V1 through V3.   DIAGNOSES:  1. Coronary artery disease:  We will continue with aspirin, statin,      ACE inhibitor, and beta blocker.  We will schedule him for a stress      Myoview for risk stratification.  If this shows normal perfusion,      then we will continue with medical therapy.  He will continue risk      factor modification including diet and exercise.  2. Ischemic cardiomyopathy:   The Myoview will also help quantitate his      left ventricular function.  His most recent Myoview showed an      ejection fraction of 40%.  If he dips to 35% or less, then he may      need to be considered for an ICD.  3. Hypertension:  His blood pressure is well controlled on his present      medications.  4. Hyperlipidemia:  We will check lipids, liver, as well as BMET and      adjust his medications as indicated.  He will see [place] back in 12      months.     [name] [name] [name] Som, MD, [facility]  Electronically Signed    BSC/MedQ  DD: 08/03/2006  DT: 08/03/2006  Job #: [id number]

## 2010-08-14 NOTE — Cardiovascular Report (Signed)
[place]. [facility]  Patient:    [name] [name], [name] [name] Visit Number: [id number] MRN: [id number]          Service Type: MED Location: [phone] Attending Physician:  Junious Silk Dictated by:   Everardo Beals Juanda Chance, M.D. [facility] Proc. Date: 06/02/01 Admit Date:  05/28/2001   CC:         Colon Flattery, D.O.  Cardiopulmonary Laboratory   Cardiac Catheterization  PROCEDURES PERFORMED: Percutaneous coronary intervention.  CLINICAL HISTORY: The patient is 55 years old and was admitted on March 2 with an anterior wall myocardial infarction, treated with prior to admission of the LAD and diagonal branches and distal protection with PercuSurge as part of the EMERALD trial. He was left with residual disease in the diagonal and a less than perfect result in the LAD and we made a decision to bring him back for further intervention on his branch stenosis today.  DESCRIPTION OF PROCEDURE: The procedure was performed via the right femoral artery using an arterial sheath and 6 French preformed coronary catheters.  We first took a picture of the LAD and then did a ventriculogram. We then made a decision to proceed with intervention using DCA of the LAD and diagonal branch. We used an 8 [place] JCL 4.0 guiding catheter and a Flexiwire and a 2.5 - 2.9 Flexicut catheter. We were able to advance the cutter into the LAD without too much difficulty. On the first run, we performed four cuts at 10 PSI, two cuts at 20 PSI and four cuts at 30 PSI in circumferential fashion. We then removed the cutter and removed a moderate amount of material which was mostly thrombose. We then rewired the LAD and again passed the cutter down the LAD and cut in a more proximal segment just before the diagonal branch. We performed two cuts at 10 PSI, five cuts at 20 PSI and three cuts at 30 PSI. We then removed the cutter and wired the diagonal branch. We had to pre-dilate the diagonal branch with  a 2.5 x 15 mm CrossSail in order to advance the cutter into the lesion. Once we advanced the cutter into the diagonal, we performed three cuts at 10 PSI, three cuts at 20 PSI and three cuts at 30 PSI. We removed a moderate amount of tissue, some of which was thrombosed and some of which appeared to be atheroma. We then touch-up balloon dilatations with a 2.5 x 15 mm balloon in the diagonal branch performing one inflation of 7 atmospheres for 68 seconds, and then we rewired the LAD and performed one inflation with a 3.0 x 20 mm balloon at 10 atmospheres for 66 seconds. Repeat diagnostic studies were then performed through the guiding catheter.  It was a long procedure and difficult procedure, but the patient tolerated it well and left the laboratory in satisfactory condition. He developed some bleeding from his gums at the end of the procedure and we had to stop the Integrilin.  RESULTS: Successful directional atherectomy of the LAD and diagonal branch (branch stenosis) with improvement in diagonal branch stenosis from 90% to 20% and improvement in the LAD stenosis from 50% to 0%.  DISPOSITION: The patient was returned to the postangioplasty unit for further observation. Dictated by:   Everardo Beals Juanda Chance, M.D. [facility] Attending Physician:  Junious Silk DD:  06/02/01 TD:  06/03/01 Job: [id number]

## 2010-08-14 NOTE — Discharge Summary (Signed)
[place]. [facility]  Patient:    [name] [name], [name] [name] Visit Number: [id number] MRN: [id number]          Service Type: MED Location: 2000 2034 01 Attending Physician:  Junious Silk Dictated by:   Guy Franco, P.A.-C. Admit Date:  05/28/2001 Discharge Date: 06/04/2001                    Referring Physician Discharge [facility]  DISCHARGE DIAGNOSES: 1. Anterior lateral myocardial infarction, status post PCI to left anterior    descending and diagonal on May 28, 2001. 2. Hypotension, resolved. 3. Hyperlipidemia, treated.  HOSPITAL COURSE:  [name] is a 55 year old male patient, with no history of coronary artery disease, who presented to [facility] on May 28, 2001, with an acute anterior lateral myocardial infarction.  He was taken emergently to the cardiac catheterization lab by Dr. Charlies Constable, and he was found to have a totalled LAD proximal, circumflex and right coronary artery normal, LV-gram revealed anterolateral apical akinesis.  The patient then underwent a PTCA of the LAD reducing the 100% lesion to a 30% lesion with a TIMI 3 flow, PTCA of the diagonal lesion that was noted to be totally occluded after the LAD was opened, and this was reduced to an 80% lesion with TIMI 3 flow.  The patient tolerated the procedure well, and he was placed in the coronary care unit for further care.  He went back to the cardiac catheterization lab on June 02, 2001, under the care of Dr. Charlies Constable to reevaluate these lesions given their severity.  The LAD demonstrated a 50% stenosis, and this was again angioplastied to a 0% lesion.  The diagonal had a 90% lesion that remained and now after angioplasty, had been reduced to a 20% lesion.  The patient tolerated this procedure well and remained stable eventually during the rest of his hospitalization.  Once concern during his hospitalization was that he was hypotensive on minimal beta blockers.  Therefore,  he could not tolerate beta blockers or even ACE inhibitors at this time secondary to his hypotension.  On discharge, his blood pressure ranged from 94-100 systolic over 50 diastolic.  If his blood pressure continues to increase as an outpatient, these medications need to be added slowly.  We did arrange for him to have cardiac rehabilitation.  He is to be out of work for the next two weeks until his office visit.  LABORATORY STUDIES:  Hemoglobin 11.1, hematocrit 31.9, platelets 292, white count 7.1.  Sodium 135, potassium 3.8, BUN 19, creatinine 1.0.  Liver functions normal.  Maximum CK was 5608 with MB fraction of 604.0.  Total cholesterol 208, triglycerides 116, HDL 40, LDL 145.  DISCHARGE MEDICATIONS: 1. Enteric-coated aspirin 325 mg 1 q.d. 2. Plavix 75 mg 1 p.o. q.d. 3. Zocor 40 mg 1 p.o. q.d. 4. Sublingual nitroglycerin p.r.n. chest pain.  DISCHARGE INSTRUCTIONS: 1. No strenuous activity. 2. No work for two weeks. 3. Low fat diet. 4. Clean over cath site with soap and water. 5. Call for questions or concerns. 6. Follow up with Dr. Jens Som in two weeks.  The office will call.  Also note that the patient did have a 2-D echocardiogram on May 30, 2001, and this revealed overall LV systolic function, moderately decreased ______ of 25-35%, mild mitral valvular regurgitation. Dictated by:   Guy Franco, P.A.-C. Attending Physician:  Junious Silk DD:  06/21/01 TD:  06/21/01 Job: [id number]

## 2010-08-14 NOTE — H&P (Signed)
[place]. [facility]  Patient:    [name] [name], [name] [name] Visit Number: [id number] MRN: [id number]          Service Type: MED Location: CCUA 2930 01 Attending Physician:  Junious Silk Dictated by:   [name], M.D. [facility] Admit Date:  05/28/2001                           History and Physical  HISTORY OF PRESENT ILLNESS:  [name] [name] is a 55 year old male with no significant past medical history, who presents with an acute anterolateral myocardial infarction.  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  At approximately 11 a.m. he developed substernal chest pressure with radiation down his left arm.  It was associated with shortness of breath, diaphoresis, and nausea.  The pain was not pleuritic or positional.  He arrived in the emergency room and his electrocardiogram was consistent with an acute anterolateral myocardial infarction.  He presently has ongoing chest pain.  MEDICATIONS:  He presently takes no medications.  ALLERGIES:  He denies any drug allergies.  SOCIAL HISTORY:  He does not smoke nor does he consume alcohol.  FAMILY HISTORY:  Negative for coronary artery disease.  PAST MEDICAL HISTORY:  There is no diabetes mellitus, hypertension, or hyperlipidemia.  He has had prior ear surgery, but there is no other significant history by report.  REVIEW OF SYSTEMS:  He denies any headaches or fevers or chills.  There is no productive cough or hemoptysis.  There is no dysphagia or odynophagia, melena, or hematochezia.  There is no dysuria or hematuria.  There is no rash or seizure activity.  There is no claudication noted.  He denies any orthopnea, PND, or pedal edema.  The remaining systems are negative.  PHYSICAL EXAMINATION:  VITAL SIGNS:  Blood pressure 130/94 and his pulse is 90.  He is afebrile.  GENERAL:  He is well developed and well nourished, in mild distress.  SKIN:  Warm and dry.  HEENT:  Unremarkable with  normal eyelids.  NECK:  Supple with a normal upstroke bilaterally and there are no bruits noted.  There was no jugular venous distention or thyromegaly noted.  CHEST:  Clear to auscultation, normal expansion.  CARDIOVASCULAR:  Regular rate and rhythm with normal S1 and S2.  I can appreciate no murmurs, rubs, or gallops.  ABDOMEN:  Not tender, positive bowel sounds, no hepatomegaly, no masses appreciated.  There was no abdominal bruit.  He has 2+ femoral pulses bilaterally and no bruits.  EXTREMITIES:  No edema, and I can palpate no cords.  He has 2+ posterior tibial pulses bilaterally.  NEUROLOGIC:  Grossly intact.  LABORATORY:  His chest x-ray shows question of mild early edema.  His electrocardiogram shows a normal sinus rhythm at a rate of 80 beats per minute.  There is ST elevation in leads V2, V3, V4, and V5, as well as V6. This is consistent with an anterolateral myocardial infarction.  DIAGNOSIS:  Acute anterolateral myocardial infarction.  PLAN:  The patient presents with an acute infarct that is now approximately 1-1/2 hours in onset.  We will plan emergent cardiac catheterization (the risks and benefits have been discussed with the patient and he agrees to proceed).  He had been treated with aspirin, heparin, beta blockade, and nitroglycerin.  Following his procedure, we will add an ACE inhibitor as tolerated by blood pressure as well as a statin. Dictated by:  [name] Jens Som, M.D. [facility] Attending Physician:  Junious Silk DD:  05/28/01 TD:  05/28/01 Job: [id number]

## 2010-08-14 NOTE — Discharge Summary (Signed)
[place]. [facility]  Patient:    [name] [name], [name] [name] Visit Number: [id number] MRN: [id number]          Service Type: MED Location: 2000 2034 01 Attending Physician:  Junious Silk Dictated by:   Guy Franco, P.A.-C. Admit Date:  05/28/2001 Discharge Date: 06/04/2001                    Referring Physician Discharge [facility]  DISCHARGE DIAGNOSES: 1. Acute anterior myocardial infarction, May 28, 2001, status post PCI to the    left anterior descending and diagonal, same day. 2. Coronary artery disease. 3. Hypotension, resolved. 4. Ischemic cardiomyopathy, ejection fraction 35%. 5. Hyperlipidemia, treated.  HOSPITAL COURSE:  [name] [name] is a 55 year old male patient, who was admitted on May 28, 2001, with an acute anterior myocardial infarction.  Dr. Juanda Chance took the patient to the cardiac catheterization lab and successfully performed PTCA of the LAD and diagonal lesions which were occluded.  Following the procedure, the LAD had a 30% residual lesion with the diagonal having an 80% residual lesion.  The anterior wall revealed akinesis and the EF was 35%.  The patient was taken back to the cardiac catheterization lab on June 02, 2001, for relook cardiac catheterization.  The LAD revealed a 50% lesion which underwent PCI to a 0% residual.  The diagonal had a 90% lesion which underwent PCI with a 20% residual.  2-D echocardiogram during his stay revealed an EF of 25-35% with left ventricular wall thickness mildly increased.  Mild MR.  LABORATORY STUDIES:  White count 7.1, hemoglobin 11.1, hematocrit 31.9, platelets 292.  Sodium 135, potassium 3.8, BUN 19, creatinine 1.0.  Maximum CK was 5608 with 604 MB fraction.  Total cholesterol was 208, triglycerides 116, LDL 145, HDL 40.  EKG revealed Q waves in the anterior leads consistent with an old anterior myocardial infarction, normal sinus rhythm, rate 73.  The patient remained stable during his  hospitalization and on June 04, 2001, he was ready for discharge to home.  During his hospitalization, we did have to back off on his ACE inhibitor and beta blocker secondary to hypotension. These may need to be restarted as an outpatient once his blood pressure stabilizes.  His blood pressure on discharge was 94-100 systolic with a diastolic blood pressure of 50.  DISCHARGE MEDICATIONS: 1. Enteric-coated aspirin 325 mg 1 p.o. q.d. 2. Plavix 75 mg 1 p.o. q.d. 3. Zocor 40 mg 1 p.o. q.d. 4. Sublingual nitroglycerin p.r.n. chest pain.  DISCHARGE INSTRUCTIONS: 1. No strenuous activity. 2. Low fat diet. 3. Clean over cath site with soap and water. 4. Follow up with [place] office in 1-2 weeks.  The office will call with an    appointment time. Dictated by:   Guy Franco, P.A.-C. Attending Physician:  Junious Silk DD:  07/03/01 TD:  07/03/01 Job: [id number]

## 2011-04-12 ENCOUNTER — Telehealth: Payer: Self-pay | Admitting: Cardiology

## 2011-04-12 MED ORDER — ROSUVASTATIN CALCIUM 20 MG PO TABS: 20.0000 mg | ORAL_TABLET | Freq: Every day | ORAL | Status: DC

## 2011-04-12 NOTE — Telephone Encounter (Signed)
New msg Pt wants to get crestor 20 mg He wants it called to [facility] [phone]

## 2011-04-14 ENCOUNTER — Other Ambulatory Visit: Payer: Self-pay | Admitting: Cardiology

## 2011-04-14 NOTE — Telephone Encounter (Signed)
FU Call: Pt calling again wanting to speak with nurse/md about possibly getting refill of crestor. Pt will be out of medication in 20 days.

## 2011-05-25 ENCOUNTER — Other Ambulatory Visit: Payer: Self-pay | Admitting: *Deleted

## 2011-06-02 ENCOUNTER — Telehealth: Payer: Self-pay | Admitting: Cardiology

## 2011-06-02 MED ORDER — CARVEDILOL 6.25 MG PO TABS: 6.2500 mg | ORAL_TABLET | Freq: Two times a day (BID) | ORAL | Status: DC

## 2011-06-02 NOTE — Telephone Encounter (Signed)
Spoke with pt, follow up appt made in [place] and script sent to the pharm for pt.

## 2011-06-02 NOTE — Telephone Encounter (Signed)
Script called into the pharm

## 2011-06-02 NOTE — Telephone Encounter (Signed)
New msg Pt wants to discuss carvedilol med. He said it was denied for refill. Please call him back

## 2011-06-21 ENCOUNTER — Encounter: Payer: Self-pay | Admitting: *Deleted

## 2011-06-23 ENCOUNTER — Ambulatory Visit (INDEPENDENT_AMBULATORY_CARE_PROVIDER_SITE_OTHER): Payer: Managed Care, Other (non HMO) | Admitting: Cardiology

## 2011-06-23 ENCOUNTER — Encounter: Payer: Self-pay | Admitting: Cardiology

## 2011-06-23 VITALS — BP 108/72 | HR 61 | Ht 68.0 in | Wt 180.0 lb

## 2011-06-23 DIAGNOSIS — E7800 Pure hypercholesterolemia, unspecified: Secondary | ICD-10-CM

## 2011-06-23 DIAGNOSIS — I10 Essential (primary) hypertension: Secondary | ICD-10-CM

## 2011-06-23 DIAGNOSIS — I2510 Atherosclerotic heart disease of native coronary artery without angina pectoris: Secondary | ICD-10-CM

## 2011-06-23 DIAGNOSIS — I2589 Other forms of chronic ischemic heart disease: Secondary | ICD-10-CM

## 2011-06-23 DIAGNOSIS — E785 Hyperlipidemia, unspecified: Secondary | ICD-10-CM

## 2011-06-23 NOTE — Patient Instructions (Signed)
Your physician wants you to follow-up in: one year with dr Shelda Pal will receive a reminder letter in the mail two months in advance. If you don't receive a letter, please call our office to schedule the follow-up appointment.   Follow up with dr Ladona Ridgel to discuss ICD  Your physician recommends that you return for lab work in: fasting when seeing dr Ladona Ridgel

## 2011-06-23 NOTE — Assessment & Plan Note (Signed)
Continue ACE inhibitor and beta blocker. Patient interested in ICD. 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 Will ask Dr. Ladona Ridgel to review in consultation.

## 2011-06-23 NOTE — Assessment & Plan Note (Signed)
Blood pressure controlled. Continue present medications. Check potassium and renal function. 

## 2011-06-23 NOTE — Assessment & Plan Note (Signed)
Continue aspirin and statin. 

## 2011-06-23 NOTE — Progress Notes (Signed)
Addended by: Freddi Starr on: 06/23/2011 04:47 PM   Modules accepted: Orders

## 2011-06-23 NOTE — Progress Notes (Signed)
   HPI:[name] [name] is a pleasant gentleman who has a history of coronary artery disease.  He had an acute anterior infarct in 2003.  At that time, he had PCI of his LAD and diagonal.  Note, the Lcx and RCA had no disease. Previously referred to Dr Ladona Ridgel for consideration of ICD. 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  He declined at that point. His last myoview in Dec 2011 showed EF 27%.  There was a large anteroapical and anteroseptal infarct, but there was no ischemia. Carotid Dopplers in June of 2009 showed normal carotids. I last saw him in May of 2011. Since then, the patient denies any dyspnea on exertion, orthopnea, PND, pedal edema, palpitations, syncope or chest pain.  Current Outpatient Prescriptions  Medication Sig Dispense Refill  . aspirin 325 MG tablet Take 325 mg by mouth daily.      . carvedilol (COREG) 6.25 MG tablet Take 1 tablet (6.25 mg total) by mouth 2 (two) times daily.  180 tablet  4  . CIALIS 20 MG tablet Take 20 mg by mouth daily as needed.       . ramipril (ALTACE) 5 MG capsule Take 5 mg by mouth 2 (two) times daily.      . rosuvastatin (CRESTOR) 20 MG tablet Take 1 tablet (20 mg total) by mouth at bedtime.  90 tablet  3     Past Medical History  Diagnosis Date  . HYPERLIPIDEMIA   . HYPERTENSION   . CAD   . CARDIOMYOPATHY, ISCHEMIC   . CHF     Past Surgical History  Procedure Date  . Pt had ear surgery     History   Social History  . Marital Status: Married    Spouse Name: N/A    Number of Children: N/A  . Years of Education: N/A   Occupational History  . Not on file.   Social History Main Topics  . Smoking status: Former Games developer  . Smokeless tobacco: Not on file  . Alcohol Use: Not on file  . Drug Use: Not on file  . Sexually Active: Not on file   Other Topics Concern  . Not on file   Social History Narrative  . No narrative on file    ROS: no fevers or chills, productive cough,  hemoptysis, dysphasia, odynophagia, melena, hematochezia, dysuria, hematuria, rash, seizure activity, orthopnea, PND, pedal edema, claudication. Remaining systems are negative.  Physical Exam: Well-developed well-nourished in no acute distress.  Skin is warm and dry.  HEENT is normal.  Neck is supple. No thyromegaly.  Chest is clear to auscultation with normal expansion.  Cardiovascular exam is regular rate and rhythm.  Abdominal exam nontender or distended. No masses palpated. Extremities show no edema. neuro grossly intact  ECG sinus rhythm at a rate of 61. Prior septal infarct. Septal and lateral T-wave inversion.

## 2011-06-23 NOTE — Assessment & Plan Note (Signed)
Continue statin. Check lipids and liver. 

## 2011-07-23 ENCOUNTER — Other Ambulatory Visit: Payer: Self-pay | Admitting: Cardiology

## 2011-07-23 MED ORDER — RAMIPRIL 5 MG PO CAPS: 5.0000 mg | ORAL_CAPSULE | Freq: Two times a day (BID) | ORAL | Status: DC

## 2011-08-13 ENCOUNTER — Ambulatory Visit (INDEPENDENT_AMBULATORY_CARE_PROVIDER_SITE_OTHER): Payer: Managed Care, Other (non HMO) | Admitting: Internal Medicine

## 2011-08-13 ENCOUNTER — Other Ambulatory Visit: Payer: Managed Care, Other (non HMO)

## 2011-08-13 ENCOUNTER — Other Ambulatory Visit: Payer: Self-pay | Admitting: Internal Medicine

## 2011-08-13 ENCOUNTER — Encounter: Payer: Self-pay | Admitting: Internal Medicine

## 2011-08-13 VITALS — BP 118/76 | HR 60 | Ht 68.0 in | Wt 176.0 lb

## 2011-08-13 DIAGNOSIS — I255 Ischemic cardiomyopathy: Secondary | ICD-10-CM

## 2011-08-13 DIAGNOSIS — I10 Essential (primary) hypertension: Secondary | ICD-10-CM

## 2011-08-13 DIAGNOSIS — E785 Hyperlipidemia, unspecified: Secondary | ICD-10-CM

## 2011-08-13 DIAGNOSIS — I509 Heart failure, unspecified: Secondary | ICD-10-CM

## 2011-08-13 DIAGNOSIS — E7800 Pure hypercholesterolemia, unspecified: Secondary | ICD-10-CM

## 2011-08-13 DIAGNOSIS — I2589 Other forms of chronic ischemic heart disease: Secondary | ICD-10-CM

## 2011-08-13 LAB — BASIC METABOLIC PANEL
BUN: 16 mg/dL (ref 6–23)
CO2: 27 mEq/L (ref 19–32)
Chloride: 106 mEq/L (ref 96–112)
Creatinine, Ser: 1 mg/dL (ref 0.4–1.5)
Potassium: 3.8 mEq/L (ref 3.5–5.1)

## 2011-08-13 LAB — LIPID PANEL
Cholesterol: 108 mg/dL (ref 0–200)
Triglycerides: 106 mg/dL (ref 0.0–149.0)

## 2011-08-13 LAB — HEPATIC FUNCTION PANEL
ALT: 21 U/L (ref 0–53)
AST: 20 U/L (ref 0–37)
Albumin: 4 g/dL (ref 3.5–5.2)
Total Protein: 7.5 g/dL (ref 6.0–8.3)

## 2011-08-13 NOTE — Patient Instructions (Addendum)
Your physician has requested that you have an echocardiogram. Echocardiography is a painless test that uses sound waves to create images of your heart. It provides your doctor with information about the size and shape of your heart and how well your heart's chambers and valves are working. This procedure takes approximately one hour. There are no restrictions for this procedure.  If EF still low will schedule for ICD implant on 09/15/11

## 2011-08-13 NOTE — Assessment & Plan Note (Signed)
I discussed the treatment options with the patient. The risks, goals, benefits, and expectations of ICD implantation have been discussed and he wishes to proceed. Because he has not had a formal ejection fraction evaluation in 18 months, we will repeat his 2-D echo to be sure that his ejection fraction has not improved in the interim.

## 2011-08-13 NOTE — Progress Notes (Signed)
HPI [name] [name] returns today for followup. He is a very pleasant 56 year old man with a history of an ischemic cardiomyopathy, status post anterior myocardial infarction. He has long-standing left ventricular dysfunction. He refused ICD implantation approximately 5 years ago. Since then he has done well with no syncope. He has class II heart failure symptoms. His ejection fraction has ranged from 25-30%. He has not had an echocardiogram however in approximately 18 months. He denies syncope, near syncope, or chest pain. No Known Allergies   Current Outpatient Prescriptions  Medication Sig Dispense Refill  . aspirin 325 MG tablet Take 325 mg by mouth daily.      . carvedilol (COREG) 6.25 MG tablet Take 1 tablet (6.25 mg total) by mouth 2 (two) times daily.  180 tablet  4  . CIALIS 20 MG tablet Take 20 mg by mouth daily as needed.       . ramipril (ALTACE) 5 MG capsule Take 1 capsule (5 mg total) by mouth 2 (two) times daily.  180 capsule  3  . rosuvastatin (CRESTOR) 20 MG tablet Take 1 tablet (20 mg total) by mouth at bedtime.  90 tablet  3     Past Medical History  Diagnosis Date  . HYPERLIPIDEMIA   . HYPERTENSION   . CAD   . CARDIOMYOPATHY, ISCHEMIC   . CHF     ROS:   All systems reviewed and negative except as noted in the HPI.   Past Surgical History  Procedure Date  . Pt had ear surgery      Family History  Problem Relation Age of Onset  . Heart failure       History   Social History  . Marital Status: Married    Spouse Name: N/A    Number of Children: N/A  . Years of Education: N/A   Occupational History  . Not on file.   Social History Main Topics  . Smoking status: Former Games developer  . Smokeless tobacco: Not on file  . Alcohol Use: Not on file  . Drug Use: Not on file  . Sexually Active: Not on file   Other Topics Concern  . Not on file   Social History Narrative  . No narrative on file     BP 118/76  Pulse 60  Ht 5\' 8"  (1.727 m)  Wt 176 lb  (79.833 kg)  BMI 26.76 kg/m2  Physical Exam:  Well appearing middle-aged man, NAD HEENT: Unremarkable Neck:  No JVD, no thyromegally Lungs:  Clear with no wheezes, rales, or rhonchi. HEART:  Regular rate rhythm, no murmurs, no rubs, no clicks Abd:  soft, positive bowel sounds, no organomegally, no rebound, no guarding Ext:  2 plus pulses, no edema, no cyanosis, no clubbing Skin:  No rashes no nodules Neuro:  CN II through XII intact, motor grossly intact  EKG Normal sinus rhythm with anteroseptal MI   Assess/Plan:

## 2011-08-17 ENCOUNTER — Ambulatory Visit (HOSPITAL_COMMUNITY): Payer: Managed Care, Other (non HMO) | Attending: Cardiology

## 2011-08-17 ENCOUNTER — Other Ambulatory Visit: Payer: Self-pay

## 2011-08-17 DIAGNOSIS — I252 Old myocardial infarction: Secondary | ICD-10-CM | POA: Insufficient documentation

## 2011-08-17 DIAGNOSIS — I10 Essential (primary) hypertension: Secondary | ICD-10-CM | POA: Insufficient documentation

## 2011-08-17 DIAGNOSIS — E785 Hyperlipidemia, unspecified: Secondary | ICD-10-CM | POA: Insufficient documentation

## 2011-08-17 DIAGNOSIS — I255 Ischemic cardiomyopathy: Secondary | ICD-10-CM

## 2011-08-17 DIAGNOSIS — I079 Rheumatic tricuspid valve disease, unspecified: Secondary | ICD-10-CM | POA: Insufficient documentation

## 2011-08-17 DIAGNOSIS — I2589 Other forms of chronic ischemic heart disease: Secondary | ICD-10-CM

## 2011-08-17 DIAGNOSIS — I080 Rheumatic disorders of both mitral and aortic valves: Secondary | ICD-10-CM | POA: Insufficient documentation

## 2011-08-19 ENCOUNTER — Telehealth: Payer: Self-pay | Admitting: Internal Medicine

## 2011-08-19 NOTE — Telephone Encounter (Signed)
Spoke with patient and let him know his echo results.  I told him I would discuss with Dr Ladona Ridgel on Wed if we are goiong to proceed with ICD implant.  He just wants to make sure insurance is going to cover

## 2011-08-19 NOTE — Telephone Encounter (Signed)
Pt calling for echo results  

## 2011-08-26 ENCOUNTER — Encounter: Payer: Self-pay | Admitting: Internal Medicine

## 2011-08-26 NOTE — Telephone Encounter (Signed)
Paulene Floor 08/26/2011 3:34 PM Signed  New Problem:  Patient called in wanting to know if his surgical clearance has been approved for his procedure. Please call back.

## 2011-08-26 NOTE — Telephone Encounter (Signed)
This encounter was created in error - please disregard.

## 2011-08-26 NOTE — Telephone Encounter (Signed)
New Problem:    Patient called in wanting to know if his surgical clearance has been approved for his procedure.  Please call back.

## 2011-08-27 ENCOUNTER — Encounter: Payer: Self-pay | Admitting: *Deleted

## 2011-08-27 ENCOUNTER — Other Ambulatory Visit: Payer: Self-pay | Admitting: *Deleted

## 2011-08-27 ENCOUNTER — Telehealth: Payer: Self-pay | Admitting: Internal Medicine

## 2011-08-27 DIAGNOSIS — I519 Heart disease, unspecified: Secondary | ICD-10-CM

## 2011-08-27 DIAGNOSIS — I509 Heart failure, unspecified: Secondary | ICD-10-CM

## 2011-08-27 NOTE — Telephone Encounter (Signed)
Pt changed the ICD implant to 09/16/11

## 2011-08-27 NOTE — Telephone Encounter (Signed)
Fu call °Patient returning your call °

## 2011-08-27 NOTE — Telephone Encounter (Signed)
Dr Ladona Ridgel has reviewed and I have set the patient up for 09/15/11

## 2011-09-08 ENCOUNTER — Other Ambulatory Visit (INDEPENDENT_AMBULATORY_CARE_PROVIDER_SITE_OTHER): Payer: Managed Care, Other (non HMO)

## 2011-09-08 DIAGNOSIS — I519 Heart disease, unspecified: Secondary | ICD-10-CM

## 2011-09-08 DIAGNOSIS — I509 Heart failure, unspecified: Secondary | ICD-10-CM

## 2011-09-08 LAB — CBC WITH DIFFERENTIAL/PLATELET
Basophils Absolute: 0 10*3/uL (ref 0.0–0.1)
HCT: 39 % (ref 39.0–52.0)
Lymphocytes Relative: 33.2 % (ref 12.0–46.0)
Monocytes Relative: 8.7 % (ref 3.0–12.0)
Neutro Abs: 2.8 10*3/uL (ref 1.4–7.7)
RBC: 4.37 Mil/uL (ref 4.22–5.81)
RDW: 13.1 % (ref 11.5–14.6)
WBC: 5.2 10*3/uL (ref 4.5–10.5)

## 2011-09-08 LAB — BASIC METABOLIC PANEL
CO2: 25 mEq/L (ref 19–32)
Chloride: 105 mEq/L (ref 96–112)
Creatinine, Ser: 1 mg/dL (ref 0.4–1.5)
Sodium: 138 mEq/L (ref 135–145)

## 2011-09-13 ENCOUNTER — Encounter (HOSPITAL_COMMUNITY): Payer: Self-pay | Admitting: Pharmacy Technician

## 2011-09-15 MED ORDER — SODIUM CHLORIDE 0.9 % IR SOLN
80.0000 mg | Status: DC
  Filled 2011-09-15: qty 2

## 2011-09-15 MED ORDER — CEFAZOLIN SODIUM 1-5 GM-% IV SOLN
1.0000 g | INTRAVENOUS | Status: DC
  Filled 2011-09-15: qty 50

## 2011-09-16 ENCOUNTER — Ambulatory Visit (HOSPITAL_COMMUNITY): Payer: Managed Care, Other (non HMO)

## 2011-09-16 ENCOUNTER — Encounter (HOSPITAL_COMMUNITY)
Admission: RE | Disposition: A | Discharge status: Home / Self Care | Payer: Self-pay | Source: Ambulatory Visit | Attending: Internal Medicine

## 2011-09-16 ENCOUNTER — Encounter (HOSPITAL_COMMUNITY): Payer: Self-pay | Admitting: General Practice

## 2011-09-16 ENCOUNTER — Ambulatory Visit (HOSPITAL_COMMUNITY)
Admission: RE | Admit: 2011-09-16 | Discharge: 2011-09-17 | Disposition: A | Discharge status: Home / Self Care | Payer: Managed Care, Other (non HMO) | Source: Ambulatory Visit | Attending: Internal Medicine | Admitting: Internal Medicine

## 2011-09-16 DIAGNOSIS — I2510 Atherosclerotic heart disease of native coronary artery without angina pectoris: Secondary | ICD-10-CM

## 2011-09-16 DIAGNOSIS — I5022 Chronic systolic (congestive) heart failure: Secondary | ICD-10-CM | POA: Insufficient documentation

## 2011-09-16 DIAGNOSIS — I252 Old myocardial infarction: Secondary | ICD-10-CM | POA: Insufficient documentation

## 2011-09-16 DIAGNOSIS — I2589 Other forms of chronic ischemic heart disease: Secondary | ICD-10-CM

## 2011-09-16 DIAGNOSIS — E785 Hyperlipidemia, unspecified: Secondary | ICD-10-CM

## 2011-09-16 DIAGNOSIS — I519 Heart disease, unspecified: Secondary | ICD-10-CM

## 2011-09-16 DIAGNOSIS — I10 Essential (primary) hypertension: Secondary | ICD-10-CM | POA: Insufficient documentation

## 2011-09-16 DIAGNOSIS — I509 Heart failure, unspecified: Secondary | ICD-10-CM

## 2011-09-16 DIAGNOSIS — Z95810 Presence of automatic (implantable) cardiac defibrillator: Secondary | ICD-10-CM

## 2011-09-16 HISTORY — DX: Heart disease, unspecified: I51.9

## 2011-09-16 HISTORY — DX: Angina pectoris, unspecified: I20.9

## 2011-09-16 HISTORY — PX: IMPLANTABLE CARDIOVERTER DEFIBRILLATOR IMPLANT: SHX5473

## 2011-09-16 HISTORY — DX: Presence of automatic (implantable) cardiac defibrillator: Z95.810

## 2011-09-16 HISTORY — PX: OTHER SURGICAL HISTORY: SHX169

## 2011-09-16 HISTORY — DX: Acute myocardial infarction, unspecified: I21.9

## 2011-09-16 LAB — PROTIME-INR
INR: 0.99 (ref 0.00–1.49)
Prothrombin Time: 13.3 seconds (ref 11.6–15.2)

## 2011-09-16 SURGERY — IMPLANTABLE CARDIOVERTER DEFIBRILLATOR IMPLANT
Anesthesia: LOCAL

## 2011-09-16 MED ORDER — ATORVASTATIN CALCIUM 10 MG PO TABS
10.0000 mg | ORAL_TABLET | Freq: Every day | ORAL | Status: DC
  Administered 2011-09-16: 10 mg via ORAL
  Filled 2011-09-16 (×2): qty 1

## 2011-09-16 MED ORDER — MIDAZOLAM HCL 5 MG/5ML IJ SOLN
INTRAMUSCULAR | Status: AC
  Filled 2011-09-16: qty 5

## 2011-09-16 MED ORDER — SODIUM CHLORIDE 0.9 % IV SOLN: 250.0000 mL | INTRAVENOUS | Status: DC

## 2011-09-16 MED ORDER — FENTANYL CITRATE 0.05 MG/ML IJ SOLN
INTRAMUSCULAR | Status: AC
  Filled 2011-09-16: qty 2

## 2011-09-16 MED ORDER — ASPIRIN 325 MG PO TABS
325.0000 mg | ORAL_TABLET | Freq: Every day | ORAL | Status: DC
  Administered 2011-09-16: 325 mg via ORAL
  Filled 2011-09-16 (×2): qty 1

## 2011-09-16 MED ORDER — MUPIROCIN 2 % EX OINT
TOPICAL_OINTMENT | CUTANEOUS | Status: AC
  Administered 2011-09-16: 1 via NASAL
  Filled 2011-09-16: qty 22

## 2011-09-16 MED ORDER — CEFAZOLIN SODIUM 1-5 GM-% IV SOLN
1.0000 g | Freq: Four times a day (QID) | INTRAVENOUS | Status: DC
  Filled 2011-09-16 (×3): qty 50

## 2011-09-16 MED ORDER — ACETAMINOPHEN 325 MG PO TABS
325.0000 mg | ORAL_TABLET | ORAL | Status: DC | PRN
  Administered 2011-09-16 (×2): 650 mg via ORAL
  Filled 2011-09-16 (×2): qty 2

## 2011-09-16 MED ORDER — CARVEDILOL 6.25 MG PO TABS
6.2500 mg | ORAL_TABLET | Freq: Two times a day (BID) | ORAL | Status: DC
  Administered 2011-09-16 (×2): 6.25 mg via ORAL
  Filled 2011-09-16 (×4): qty 1

## 2011-09-16 MED ORDER — SODIUM CHLORIDE 0.9 % IJ SOLN: 3.0000 mL | INTRAMUSCULAR | Status: DC | PRN

## 2011-09-16 MED ORDER — LIDOCAINE HCL (PF) 1 % IJ SOLN
INTRAMUSCULAR | Status: AC
  Filled 2011-09-16: qty 60

## 2011-09-16 MED ORDER — RAMIPRIL 5 MG PO CAPS
5.0000 mg | ORAL_CAPSULE | Freq: Two times a day (BID) | ORAL | Status: DC
  Administered 2011-09-16 (×2): 5 mg via ORAL
  Filled 2011-09-16 (×4): qty 1

## 2011-09-16 MED ORDER — TRAMADOL HCL 50 MG PO TABS
50.0000 mg | ORAL_TABLET | Freq: Two times a day (BID) | ORAL | Status: DC | PRN
  Administered 2011-09-16 – 2011-09-17 (×2): 50 mg via ORAL
  Filled 2011-09-16 (×2): qty 1

## 2011-09-16 MED ORDER — SODIUM CHLORIDE 0.45 % IV SOLN
INTRAVENOUS | Status: DC
  Administered 2011-09-16: 07:00:00 via INTRAVENOUS

## 2011-09-16 MED ORDER — CEFAZOLIN SODIUM 1-5 GM-% IV SOLN
1.0000 g | Freq: Four times a day (QID) | INTRAVENOUS | Status: AC
  Administered 2011-09-16 (×2): 1 g via INTRAVENOUS
  Filled 2011-09-16 (×2): qty 50

## 2011-09-16 MED ORDER — ONDANSETRON HCL 4 MG/2ML IJ SOLN: 4.0000 mg | Freq: Four times a day (QID) | INTRAMUSCULAR | Status: DC | PRN

## 2011-09-16 MED ORDER — SODIUM CHLORIDE 0.9 % IJ SOLN: 3.0000 mL | Freq: Two times a day (BID) | INTRAMUSCULAR | Status: DC

## 2011-09-16 MED ORDER — CEFAZOLIN SODIUM 1-5 GM-% IV SOLN
INTRAVENOUS | Status: AC
  Filled 2011-09-16: qty 50

## 2011-09-16 MED ORDER — HYDRALAZINE HCL 20 MG/ML IJ SOLN
INTRAMUSCULAR | Status: AC
  Filled 2011-09-16: qty 1

## 2011-09-16 NOTE — Interval H&P Note (Signed)
History and Physical Interval Note: In the interval, the patient has had an Echo with EF 30%. Will proceed with ICD implant.  09/16/2011 7:23 AM  [name] [name]  has presented today for surgery, with the diagnosis of chf  The various methods of treatment have been discussed with the patient and family. After consideration of risks, benefits and other options for treatment, the patient has consented to  Procedure(s) (LRB): IMPLANTABLE CARDIOVERTER DEFIBRILLATOR IMPLANT (N/A) as a surgical intervention .  The patient's history has been reviewed, patient examined, no change in status, stable for surgery.  I have reviewed the patients' chart and labs.  Questions were answered to the patient's satisfaction.     [name]

## 2011-09-16 NOTE — Op Note (Signed)
[name] [name], FIX                ACCOUNT NO.:  [id number]  MEDICAL RECORD NO.:  [id number]  LOCATION:  3715                         FACILITY:  [facility]  PHYSICIAN:  Doylene Canning. Ladona Ridgel, MD    DATE OF BIRTH:  04/21/1955  DATE OF PROCEDURE:  09/16/2011 DATE OF DISCHARGE:                              OPERATIVE REPORT   PROCEDURE PERFORMED:  Insertion of a single-chamber defibrillator.  INDICATION:  Ischemic cardiomyopathy, EF 30%, class 2 congestive heart failure despite maximal medical therapy, status post myocardial infarction.  INTRODUCTION:  The patient is a very pleasant 57 year old male with longstanding ischemic cardiomyopathy, status post MI over 5 years ago. He has chronic systolic dysfunction with an ejection fraction of 30%. He is on maximal medical therapy with carvedilol and Altace.  He is now referred for prophylactic ICD implantation.  PROCEDURE:  After informed consent was obtained, the patient was taken to the diagnostic EP lab in a fasting state.  After usual preparation and draping, intravenous fentanyl and midazolam were given for sedation. A 30 mL of lidocaine was infiltrated into the left infraclavicular region.  A 7-cm incision was carried out over this region and electrocautery was utilized to dissect down to the fascial plane.  The cephalic vein was dissected free and the [employer]iance Goretex lead, serial number [id number] was advanced by way of the cephalic vein into the right ventricle.  Mapping was carried out. It should be noted that throughout the right ventricle, the R-waves were reduced.  At the final site, the initial R-wave measurement was 7, but the final R-wave measurement was 10.  The pace impedance was 600 ohms. The threshold was 0.8 V at 0.5 msec.  10 V pacing did not stimulate the diaphragm.  With active fixation of the lead, there was a large injury current present.  With these satisfactory parameters, the lead was secured  to the subpectoral fascia with a figure-of-eight silk suture. The sewing sleeve was secured with silk suture.  Electrocautery was then utilized to make a subcutaneous pocket.  Antibiotic irrigation was utilized to irrigate the pocket.  Electrocautery was utilized to assure hemostasis.  The St. Jude Ellipse VR single-chamber defibrillator, serial number [id number] was connected to the defibrillation lead and placed back into the subcutaneous pocket.  The pocket was irrigated with antibiotic irrigation.  The incision was closed with 2-0 and 3-0 Vicryl. At this point, I scrubbed out of the case to supervise defibrillation threshold testing.  After the patient was more deeply sedated, under my direct supervision, VF was induced with T-wave shock.  A 15-joule shock was then delivered which terminated ventricular fibrillation and restored sinus rhythm.  At this point, no additional defibrillation threshold testing was carried out.  The Benzoin and Steri-Strips were painted on the skin.  A pressure dressing was applied and the patient was returned to his room in satisfactory condition.  COMPLICATIONS:  There were no immediate procedure complications.  RESULTS:  This demonstrates successful implantation of a St. Jude single- chamber defibrillator with a Clinical biochemist active fixation defibrillation lead without immediate procedure complication.     Doylene Canning. Ladona Ridgel, MD     GWT/MEDQ  D:  09/16/2011  T:  09/16/2011  Job:  [id number]

## 2011-09-16 NOTE — Op Note (Signed)
ICD implant via the left cephalic vein without immediate complication. Z#[id number].

## 2011-09-16 NOTE — H&P (Signed)
HPI  [name] returns today for followup. He is a very pleasant 56 year old man with a history of an ischemic cardiomyopathy, status post anterior myocardial infarction. He has long-standing left ventricular dysfunction. He refused ICD implantation approximately 5 years ago. Since then he has done well with no syncope. He has class II heart failure symptoms. His ejection fraction has ranged from 25-30%. He has not had an echocardiogram however in approximately 18 months. He denies syncope, near syncope, or chest pain. He has class 2 CHF despite maximal medical therapy with Altace and carvediolol. No Known Allergies  Current Outpatient Prescriptions   Medication  Sig  Dispense  Refill   .  aspirin 325 MG tablet  Take 325 mg by mouth daily.     .  carvedilol (COREG) 6.25 MG tablet  Take 1 tablet (6.25 mg total) by mouth 2 (two) times daily.  180 tablet  4   .  CIALIS 20 MG tablet  Take 20 mg by mouth daily as needed.     .  ramipril (ALTACE) 5 MG capsule  Take 1 capsule (5 mg total) by mouth 2 (two) times daily.  180 capsule  3   .  rosuvastatin (CRESTOR) 20 MG tablet  Take 1 tablet (20 mg total) by mouth at bedtime.  90 tablet  3    Past Medical History   Diagnosis  Date   .  HYPERLIPIDEMIA    .  HYPERTENSION    .  CAD    .  CARDIOMYOPATHY, ISCHEMIC    .  CHF     ROS:  All systems reviewed and negative except as noted in the HPI.  Past Surgical History   Procedure  Date   .  Pt had ear surgery     Family History   Problem  Relation  Age of Onset   .  Heart failure      History    Social History   .  Marital Status:  Married     Spouse Name:  N/A     Number of Children:  N/A   .  Years of Education:  N/A    Occupational History   .  Not on file.    Social History Main Topics   .  Smoking status:  Former Games developer   .  Smokeless tobacco:  Not on file   .  Alcohol Use:  Not on file   .  Drug Use:  Not on file   .  Sexually Active:  Not on file    Other Topics  Concern     .  Not on file    Social History Narrative   .  No narrative on file    BP 118/76  Pulse 60  Ht 5\' 8"  (1.727 m)  Wt 176 lb (79.833 kg)  BMI 26.76 kg/m2  Physical Exam:  Well appearing middle-aged man, NAD  HEENT: Unremarkable  Neck: No JVD, no thyromegally  Lungs: Clear with no wheezes, rales, or rhonchi.  HEART: Regular rate rhythm, no murmurs, no rubs, no clicks  Abd: soft, positive bowel sounds, no organomegally, no rebound, no guarding  Ext: 2 plus pulses, no edema, no cyanosis, no clubbing  Skin: No rashes no nodules  Neuro: CN II through XII intact, motor grossly intact  EKG  Normal sinus rhythm with anteroseptal MI  Assess/Plan:  Chronic Systolic CHF - His symptoms are class 2 despite maximal medical therapy. Will repeat 2D echo. CARDIOMYOPATHY, ISCHEMIC - Sharlot Gowda  Ladona Ridgel, MD 08/13/2011 6:56 PM Signed  I discussed the treatment options with the patient. The risks, goals, benefits, and expectations of ICD implantation have been discussed and he wishes to proceed. Because he has not had a formal ejection fraction evaluation in 18 months, we will repeat his 2-D echo to be sure that his ejection fraction has not improved in the interim.  [name], M.D.

## 2011-09-16 NOTE — Progress Notes (Signed)
UR Completed Alga Southall Graves-Bigelow, RN,BSN [phone]  

## 2011-09-17 ENCOUNTER — Ambulatory Visit (HOSPITAL_COMMUNITY): Payer: Managed Care, Other (non HMO)

## 2011-09-17 DIAGNOSIS — I509 Heart failure, unspecified: Secondary | ICD-10-CM

## 2011-09-17 NOTE — Discharge Summary (Signed)
ELECTROPHYSIOLOGY DISCHARGE SUMMARY    Patient ID: [name] [name],  MRN: [id number], DOB/AGE: June 04, 1955 56 y.o.  Admit date: 09/16/2011 Discharge date: 09/17/2011  Primary Care Physician: Rudi Heap, MD Primary Cardiologist: Olga Millers, MD  Primary Discharge Diagnosis:  1. Ischemic cardiomyopathy, EF 30%, s/p single chamber ICD implantation  Secondary Discharge Diagnoses:  1. Chronic systolic CHF 2. CAD 3. HTN 4. Dyslipidemia  Procedures This Admission:  1. Single chamber ICD implantation 09/16/2011 [facility] [employer] Goretex lead, serial number [id number] was advanced by way of the cephalic vein into the right ventricle. St. Jude Ellipse VR single-chamber defibrillator, serial number [id number].  History and Hospital Course:  [name] [name] is a 57 year old gentleman with an ischemic cardiomyopathy, status post anterior myocardial infarction. He has long-standing left ventricular dysfunction. He refused ICD implantation approximately 5 years ago. Since then he has done well with no syncope. He has class II heart failure symptoms. His ejection fraction has ranged from 25-30%. He had a recent echo which showed persistent LV dysfunction, EF 30-35%, despite optimal medical therapy; therefore, ICD was recommended for primary prevention of SCD. He underwent single chamber ICD implantation yesterday. He tolerated the procedure well without any immediate complications. He remained overnight for observation and is hemodynamically stable and afebrile. His device interrogation shows normal ICD function with stable lead parameters. His chest x-ray shows stable lead placement without pneumothorax. His implant site is intact without significant bleeding or hematoma. He has been seen, examined and deemed stable for discharge today by Dr. Berton Mount.  Physical Exam: Vitals: BP 103/64, pulse 59, temp 97.6 F (36.4 C), resp 20, height 5\' 8" , weight 176 lb, SpO2 98%.  General: Well  developed, well appearing 56 year old male in no acute distress.  Heart: RRR. S1, S2 present without murmur, rub, S3 or S4.  Lungs: CTA bilaterally with normal effort. No wheezes, rales or rhonchi.  Abdomen: Soft, nondistended.  Extremities: No cyanosis, clubbing or edema.  Skin: Left upper chest/implant site intact without significant bleeding or hematoma.  Labs: None in >48 hours  Disposition:  The patient is being discharged in stable condition.  Follow-up:  Follow up with [name] on 09/29/2011. (At 12:30 PM for wound check)    Contact information:   [address]  [address] [place] [address] [phone]    Follow up with [name], MD on 12/21/2011. (At 3:00 PM)    Contact information:   [address]  [address] [place] [address] [phone]   Discharge Medications:   TAKE these medications     aspirin 325 MG tablet   Take 325 mg by mouth daily.      carvedilol 6.25 MG tablet   Commonly known as: COREG   Take 6.25 mg by mouth 2 (two) times daily.      CIALIS 20 MG tablet   Generic drug: tadalafil   Take 20 mg by mouth as needed. As needed for erectile dysfunction.      ramipril 5 MG capsule   Commonly known as: ALTACE   Take 5 mg by mouth 2 (two) times daily.      rosuvastatin 20 MG tablet   Commonly known as: CRESTOR   Take 20 mg by mouth at bedtime.      Duration of Discharge Encounter: Greater than 30 minutes including physician time.  Limmie Patricia, PA-C 09/17/2011, 8:51 AM  Sherryl Manges, MD 09/17/2011 9:42 AM

## 2011-09-17 NOTE — Progress Notes (Signed)
Pt provided with d/c instructions and education. Pt provided with post ICD education (when to call the MD, exercise, shower, and driving restrictions). Pt able to teachback information and has no questions at this time. Pt has scheduled follow up appointments and is aware of times and places. No needs at this time. IV removed with tip intact. Heart monitor cleaned and returned to front. Ramond Craver, RN

## 2011-09-17 NOTE — Progress Notes (Signed)
   Patient Name: [name] [name]      SUBJECTIVE: s/p icd implant for primary prevention   Past Medical History  Diagnosis Date  . HYPERLIPIDEMIA   . HYPERTENSION   . CAD   . CARDIOMYOPATHY, ISCHEMIC   . CHF   . Myocardial infarction   . Anginal pain   . Left ventricular dysfunction   . ICD (implantable cardiac defibrillator) in place 09/16/2011    PHYSICAL EXAM Filed Vitals:   09/16/11 1600 09/16/11 2000 09/17/11 0000 09/17/11 0500  BP: 102/66 108/71 94/65 103/64  Pulse: 60 65 61 59  Temp:  97.4 F (36.3 C) 97.6 F (36.4 C) 97.6 F (36.4 C)  TempSrc:  Oral Oral Oral  Resp:  18 20 20   Height:      Weight:      SpO2:  96% 99% 98%   Well developed and nourished in no acute distress HENT normal Neck supple with JVP-flat Clear Pocket without hematoma Regular rate and rhythm, no murmurs or gallops Abd-soft with active BS No Clubbing cyanosis edema Skin-warm and dry A & Oriented  Grossly normal sensory and motor function     Intake/Output Summary (Last 24 hours) at 09/17/11 0931 Last data filed at 09/17/11 0843  Gross per 24 hour  Intake    960 ml  Output   1600 ml  Net   -640 ml    LABS: Basic Metabolic Panel: No results found for this basename: NA:7,K:7,CL:7,CO2:7,GLUCOSE:7,BUN:7,CREATININE:7,CALCIUM:2,MG:2,PHOS:2 in the last 168 hours Cardiac Enzymes: No results found for this basename: CKTOTAL:3,CKMB:3,CKMBINDEX:3,TROPONINI:3 in the last 72 hours CBC: No results found for this basename: WBC:7,NEUTROABS:7,HGB:7,HCT:7,MCV:7,PLT:7 in the last 168 hours PROTIME:  Basename 09/16/11 0619  LABPROT 13.3  INR 0.99     Device Interrogation: normal    ASSESSMENT AND PLAN:  Ischemic cardiomyopathy  S/p icd implant  Instructions given  Signed, Sherryl Manges MD  09/17/2011

## 2011-09-17 NOTE — Discharge Instructions (Addendum)
   Supplemental Discharge Instructions for  Pacemaker/Defibrillator Patients  Activity No heavy lifting or vigorous activity with your left/right arm for 6 to 8 weeks.  Do not raise your left/right arm above your head for one week.  Gradually raise your affected arm as drawn below.           06/23                       06/24                       06/25                      06/26       NO DRIVING for 1 week; you may begin driving on 16/12/9602. You may return to work on 09/23/2011. WOUND CARE   Keep the wound area clean and dry.  Do not get this area wet for one week. No showers for one week; you may shower on 09/24/2011.   The tape/steri-strips on your wound will fall off; do not pull them off.  No bandage is needed on the site.  DO  NOT apply any creams, oils, or ointments to the wound area.   If you notice any drainage or discharge from the wound, any swelling or bruising at the site, or you develop a fever > 101? F after you are discharged home, call the office at once.  Special Instructions   You are still able to use cellular telephones; use the ear opposite the side where you have your pacemaker/defibrillator.  Avoid carrying your cellular phone near your device.   When traveling through airports, show security personnel your identification card to avoid being screened in the metal detectors.  Ask the security personnel to use the hand wand.   Avoid arc welding equipment, MRI testing (magnetic resonance imaging), TENS units (transcutaneous nerve stimulators).  Call the office for questions about other devices.   Avoid electrical appliances that are in poor condition or are not properly grounded.   Microwave ovens are safe to be near or to operate.  Additional information for defibrillator patients should your device go off:   If your device goes off ONCE and you feel fine afterward, notify the device clinic nurses.   If your device goes off ONCE and you do not feel well afterward,  call 911.   If your device goes off TWICE, call 911.   If your device goes off THREE times in one day, call 911.  DO NOT DRIVE YOURSELF OR A FAMILY MEMBER WITH A DEFIBRILLATOR TO THE HOSPITAL--CALL 911.

## 2011-09-22 ENCOUNTER — Encounter: Payer: Self-pay | Admitting: *Deleted

## 2011-09-22 DIAGNOSIS — Z95810 Presence of automatic (implantable) cardiac defibrillator: Secondary | ICD-10-CM | POA: Insufficient documentation

## 2011-09-29 ENCOUNTER — Encounter: Payer: Self-pay | Admitting: Cardiology

## 2011-09-29 ENCOUNTER — Ambulatory Visit (INDEPENDENT_AMBULATORY_CARE_PROVIDER_SITE_OTHER): Payer: Managed Care, Other (non HMO) | Admitting: Cardiology

## 2011-09-29 DIAGNOSIS — I2589 Other forms of chronic ischemic heart disease: Secondary | ICD-10-CM

## 2011-09-29 DIAGNOSIS — Z95810 Presence of automatic (implantable) cardiac defibrillator: Secondary | ICD-10-CM

## 2011-09-29 LAB — ICD DEVICE OBSERVATION
DEV-0020ICD: NEGATIVE
HV IMPEDENCE: 59 Ohm
TZON-0003FASTVT: 270.2 ms
VENTRICULAR PACING ICD: 0 pct

## 2011-09-29 NOTE — Progress Notes (Signed)
Wound check.  Device interrogation only.  See PaceArt for device details.

## 2011-11-08 ENCOUNTER — Telehealth: Payer: Self-pay | Admitting: Internal Medicine

## 2011-11-08 MED ORDER — AMOXICILLIN 500 MG PO CAPS: 2000.0000 mg | ORAL_CAPSULE | Freq: Once | ORAL | Status: AC

## 2011-11-08 NOTE — Telephone Encounter (Signed)
He will need antibiotics for 4 more weeks  I have left a message with the above and ask him to call me back

## 2011-11-08 NOTE — Telephone Encounter (Signed)
Fu call Pt called back he said call to [facility] aid in [place] [phone] [phone]

## 2011-11-08 NOTE — Telephone Encounter (Signed)
Tried calling patient no answer will send in script to pharmacy

## 2011-11-08 NOTE — Telephone Encounter (Signed)
New msg Pt is going to have his teeth cleaned and wanted to know should he get antibiotics before.

## 2011-11-08 NOTE — Telephone Encounter (Signed)
Amoxicillin 500mg  4 tablets one hour prior to proceedure

## 2011-12-21 ENCOUNTER — Ambulatory Visit (INDEPENDENT_AMBULATORY_CARE_PROVIDER_SITE_OTHER): Payer: Managed Care, Other (non HMO) | Admitting: Internal Medicine

## 2011-12-21 ENCOUNTER — Encounter: Payer: Self-pay | Admitting: Internal Medicine

## 2011-12-21 VITALS — BP 105/71 | HR 57 | Ht 68.0 in | Wt 181.4 lb

## 2011-12-21 DIAGNOSIS — I509 Heart failure, unspecified: Secondary | ICD-10-CM

## 2011-12-21 DIAGNOSIS — Z95810 Presence of automatic (implantable) cardiac defibrillator: Secondary | ICD-10-CM

## 2011-12-21 DIAGNOSIS — I2589 Other forms of chronic ischemic heart disease: Secondary | ICD-10-CM

## 2011-12-21 LAB — ICD DEVICE OBSERVATION
BRDY-0002RV: 40 {beats}/min
DEV-0020ICD: NEGATIVE
TZON-0003FASTVT: 270.2 ms
VENTRICULAR PACING ICD: 1 pct

## 2011-12-21 NOTE — Assessment & Plan Note (Signed)
His chronic systolic heart failure is class II. He will continue his current medical therapy and maintain a low-sodium diet. 

## 2011-12-21 NOTE — Patient Instructions (Addendum)
Remote monitoring is used to monitor your Pacemaker of ICD from home. This monitoring reduces the number of office visits required to check your device to one time per year. It allows [place] to keep an eye on the functioning of your device to ensure it is working properly. You are scheduled for a device check from home on March 27, 2012. You may send your transmission at any time that day. If you have a wireless device, the transmission will be sent automatically. After your physician reviews your transmission, you will receive a postcard with your next transmission date.  Your physician wants you to follow-up in: 9 months with Dr Ladona Ridgel.  You will receive a reminder letter in the mail two months in advance. If you don't receive a letter, please call our office to schedule the follow-up appointment.

## 2011-12-21 NOTE — Assessment & Plan Note (Signed)
His St. Jude single chamber defibrillator is working normally. Plan to recheck in several months.

## 2011-12-21 NOTE — Progress Notes (Signed)
HPI [name] [name] returns today for followup. He is a very pleasant 56 year old man with an ischemic cardiomyopathy, chronic systolic heart failure, status post ICD implantation for primary prevention. In the interim, he has done well. He denies chest pain, shortness of breath, peripheral edema, syncope, or any ICD shock. Allergies  Allergen Reactions  . Plavix (Clopidogrel) Itching and Rash     Current Outpatient Prescriptions  Medication Sig Dispense Refill  . aspirin 325 MG tablet Take 325 mg by mouth daily.      . carvedilol (COREG) 6.25 MG tablet Take 6.25 mg by mouth 2 (two) times daily.      Marland Kitchen CIALIS 20 MG tablet Take 20 mg by mouth as needed. As needed for erectile dysfunction.      . ramipril (ALTACE) 5 MG capsule Take 5 mg by mouth 2 (two) times daily.      . rosuvastatin (CRESTOR) 20 MG tablet Take 20 mg by mouth at bedtime.         Past Medical History  Diagnosis Date  . HYPERLIPIDEMIA   . HYPERTENSION   . CAD   . CARDIOMYOPATHY, ISCHEMIC   . CHF   . Myocardial infarction   . Anginal pain   . Left ventricular dysfunction   . ICD (implantable cardiac defibrillator) in place 09/16/2011    ROS:   All systems reviewed and negative except as noted in the HPI.   Past Surgical History  Procedure Date  . Pt had ear surgery   . Icd 09/16/2011     Family History  Problem Relation Age of Onset  . Heart failure       History   Social History  . Marital Status: Married    Spouse Name: N/A    Number of Children: N/A  . Years of Education: N/A   Occupational History  . Not on file.   Social History Main Topics  . Smoking status: Former Smoker    Quit date: 09/16/1971  . Smokeless tobacco: Never Used  . Alcohol Use: No  . Drug Use: No  . Sexually Active: Yes   Other Topics Concern  . Not on file   Social History Narrative  . No narrative on file     BP 105/71  Pulse 57  Ht 5\' 8"  (1.727 m)  Wt 181 lb 6.4 oz (82.283 kg)  BMI 27.58  kg/m2  Physical Exam:  Well appearing NAD HEENT: Unremarkable Neck:  No JVD, no thyromegally Lungs:  Clear with no wheezes, rales, or rhonchi. HEART:  Regular rate rhythm, no murmurs, no rubs, no clicks Abd:  soft, positive bowel sounds, no organomegally, no rebound, no guarding Ext:  2 plus pulses, no edema, no cyanosis, no clubbing Skin:  No rashes no nodules Neuro:  CN II through XII intact, motor grossly intact  DEVICE  Normal device function.  See PaceArt for details.   Assess/Plan:

## 2011-12-21 NOTE — Assessment & Plan Note (Signed)
He denies anginal symptoms. He will continue his current medical therapy and maintain a low-sodium diet.

## 2012-03-23 ENCOUNTER — Telehealth: Payer: Self-pay | Admitting: Cardiology

## 2012-03-23 MED ORDER — ROSUVASTATIN CALCIUM 20 MG PO TABS: 20.0000 mg | ORAL_TABLET | Freq: Every day | ORAL | Status: DC

## 2012-03-23 NOTE — Telephone Encounter (Signed)
Fax Received. Refill Completed. [name] [name] (R.M.A)   

## 2012-03-23 NOTE — Telephone Encounter (Signed)
New Problem:    Patient called in needing a refill of his rosuvastatin (CRESTOR) 20 MG tablet sent in to [facility].

## 2012-03-27 ENCOUNTER — Encounter: Payer: Managed Care, Other (non HMO) | Admitting: *Deleted

## 2012-03-30 ENCOUNTER — Other Ambulatory Visit: Payer: Self-pay | Admitting: *Deleted

## 2012-03-30 ENCOUNTER — Encounter: Payer: Self-pay | Admitting: *Deleted

## 2012-03-30 MED ORDER — ROSUVASTATIN CALCIUM 20 MG PO TABS: 20.0000 mg | ORAL_TABLET | Freq: Every day | ORAL | Status: DC

## 2012-03-31 ENCOUNTER — Other Ambulatory Visit: Payer: Self-pay | Admitting: *Deleted

## 2012-03-31 MED ORDER — ROSUVASTATIN CALCIUM 20 MG PO TABS: 20.0000 mg | ORAL_TABLET | Freq: Every day | ORAL | Status: DC

## 2012-04-03 ENCOUNTER — Ambulatory Visit (INDEPENDENT_AMBULATORY_CARE_PROVIDER_SITE_OTHER): Payer: Managed Care, Other (non HMO) | Admitting: *Deleted

## 2012-04-03 ENCOUNTER — Encounter: Payer: Self-pay | Admitting: Internal Medicine

## 2012-04-03 DIAGNOSIS — Z95810 Presence of automatic (implantable) cardiac defibrillator: Secondary | ICD-10-CM

## 2012-04-03 DIAGNOSIS — I2589 Other forms of chronic ischemic heart disease: Secondary | ICD-10-CM

## 2012-04-05 LAB — REMOTE ICD DEVICE
DEV-0020ICD: NEGATIVE
HV IMPEDENCE: 66 Ohm
RV LEAD IMPEDENCE ICD: 480 Ohm
TZAT-0001SLOWVT: 1
TZAT-0012SLOWVT: 200 ms
TZAT-0013SLOWVT: 2
TZAT-0018SLOWVT: NEGATIVE
TZAT-0019SLOWVT: 7.5 V
TZAT-0020SLOWVT: 1 ms
TZON-0003SLOWVT: 310 ms
TZON-0004SLOWVT: 30
TZON-0005SLOWVT: 6
TZST-0001SLOWVT: 4
TZST-0003SLOWVT: 30 J
TZST-0003SLOWVT: 36 J

## 2012-04-11 ENCOUNTER — Encounter: Payer: Self-pay | Admitting: *Deleted

## 2012-07-03 ENCOUNTER — Ambulatory Visit (INDEPENDENT_AMBULATORY_CARE_PROVIDER_SITE_OTHER): Payer: Managed Care, Other (non HMO) | Admitting: *Deleted

## 2012-07-03 ENCOUNTER — Other Ambulatory Visit: Payer: Self-pay | Admitting: Internal Medicine

## 2012-07-03 DIAGNOSIS — I509 Heart failure, unspecified: Secondary | ICD-10-CM

## 2012-07-03 DIAGNOSIS — I2589 Other forms of chronic ischemic heart disease: Secondary | ICD-10-CM

## 2012-07-03 DIAGNOSIS — Z95810 Presence of automatic (implantable) cardiac defibrillator: Secondary | ICD-10-CM

## 2012-07-04 LAB — REMOTE ICD DEVICE
HV IMPEDENCE: 59 Ohm
RV LEAD AMPLITUDE: 12 mv
RV LEAD IMPEDENCE ICD: 460 Ohm
TZAT-0001SLOWVT: 1
TZAT-0012SLOWVT: 200 ms
TZAT-0013SLOWVT: 2
TZST-0001SLOWVT: 2
TZST-0001SLOWVT: 4
TZST-0003SLOWVT: 30 J
TZST-0003SLOWVT: 36 J
VENTRICULAR PACING ICD: 1 pct

## 2012-07-25 ENCOUNTER — Encounter: Payer: Self-pay | Admitting: *Deleted

## 2012-07-25 ENCOUNTER — Other Ambulatory Visit: Payer: Self-pay

## 2012-07-25 MED ORDER — RAMIPRIL 5 MG PO CAPS: 5.0000 mg | ORAL_CAPSULE | Freq: Two times a day (BID) | ORAL | Status: DC

## 2012-07-27 ENCOUNTER — Encounter: Payer: Self-pay | Admitting: Internal Medicine

## 2012-08-01 ENCOUNTER — Telehealth: Payer: Self-pay

## 2012-08-01 ENCOUNTER — Other Ambulatory Visit: Payer: Self-pay

## 2012-08-01 MED ORDER — CARVEDILOL 6.25 MG PO TABS: 6.2500 mg | ORAL_TABLET | Freq: Two times a day (BID) | ORAL | Status: DC

## 2012-08-01 NOTE — Telephone Encounter (Signed)
pt called to rqst rx refill for carvedilol. refiil completed pt notified. 180 R-1. sent to [facility] home delivery.

## 2012-08-30 ENCOUNTER — Encounter: Payer: Managed Care, Other (non HMO) | Admitting: Cardiology

## 2012-08-30 NOTE — Progress Notes (Signed)
   HPI: Mr. Teall is a pleasant gentleman who has a history of coronary artery disease. He had an acute anterior infarct in 2003. At that time, he had PCI of his LAD and diagonal. Note, the Lcx and RCA had no disease. His last myoview in Dec 2011 showed EF 27%. There was a large anteroapical and anteroseptal infarct, but there was no ischemia. Carotid Dopplers in June of 2009 showed normal carotids. Echocardiogram in May of 2013 showed an ejection fraction of 30-35%, mild left atrial enlargement, mild mitral regurgitation and trace aortic insufficiency. Patient had ICD implanted in June of 2013. I last saw him in March 2013. Since then, the patient denies any dyspnea on exertion, orthopnea, PND, pedal edema, palpitations, syncope or chest pain.   Current Outpatient Prescriptions  Medication Sig Dispense Refill  . aspirin 325 MG tablet Take 325 mg by mouth daily.      . carvedilol (COREG) 6.25 MG tablet Take 1 tablet (6.25 mg total) by mouth 2 (two) times daily.  180 tablet  1  . CIALIS 20 MG tablet Take 20 mg by mouth as needed. As needed for erectile dysfunction.      . ramipril (ALTACE) 5 MG capsule Take 1 capsule (5 mg total) by mouth 2 (two) times daily.  60 capsule  3  . rosuvastatin (CRESTOR) 20 MG tablet Take 1 tablet (20 mg total) by mouth at bedtime.  90 tablet  3   No current facility-administered medications for this visit.     Past Medical History  Diagnosis Date  . HYPERLIPIDEMIA   . HYPERTENSION   . CAD   . CARDIOMYOPATHY, ISCHEMIC   . CHF   . Myocardial infarction   . Anginal pain   . Left ventricular dysfunction   . 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 Pt had ear surgery    . Icd  09/16/2011    History   Social History  . Marital Status: Married    Spouse Name: N/A    Number of Children: N/A  . Years of Education: N/A   Occupational History  . Not on file.   Social History Main  Topics  . Smoking status: Former Smoker    Quit date: 09/16/1971  . Smokeless tobacco: Never Used  . Alcohol Use: No  . Drug Use: No  . Sexually Active: Yes   Other Topics Concern  . Not on file   Social History Narrative  . No narrative on file    ROS: no fevers or chills, productive cough, hemoptysis, dysphasia, odynophagia, melena, hematochezia, dysuria, hematuria, rash, seizure activity, orthopnea, PND, pedal edema, claudication. Remaining systems are negative.  Physical Exam: Well-developed well-nourished in no acute distress.  Skin is warm and dry.  HEENT is normal.  Neck is supple.  Chest is clear to auscultation with normal expansion.  Cardiovascular exam is regular rate and rhythm.  Abdominal exam nontender or distended. No masses palpated. Extremities show no edema. neuro grossly intact  ECG     This encounter was created in error - please disregard.

## 2012-09-26 ENCOUNTER — Ambulatory Visit (INDEPENDENT_AMBULATORY_CARE_PROVIDER_SITE_OTHER): Payer: Managed Care, Other (non HMO) | Admitting: Internal Medicine

## 2012-09-26 ENCOUNTER — Encounter: Payer: Self-pay | Admitting: Internal Medicine

## 2012-09-26 VITALS — BP 120/68 | HR 57 | Ht 68.0 in | Wt 176.6 lb

## 2012-09-26 DIAGNOSIS — I10 Essential (primary) hypertension: Secondary | ICD-10-CM

## 2012-09-26 DIAGNOSIS — I509 Heart failure, unspecified: Secondary | ICD-10-CM

## 2012-09-26 DIAGNOSIS — I2589 Other forms of chronic ischemic heart disease: Secondary | ICD-10-CM

## 2012-09-26 LAB — ICD DEVICE OBSERVATION
BRDY-0002RV: 40 {beats}/min
DEV-0020ICD: NEGATIVE
FVT: 0
HV IMPEDENCE: 71 Ohm
PACEART VT: 0
TOT-0007: 1
TOT-0010: 0
TZAT-0001SLOWVT: 1
TZAT-0004SLOWVT: 8
TZAT-0012SLOWVT: 200 ms
TZAT-0013SLOWVT: 2
TZAT-0020SLOWVT: 1 ms
TZON-0005SLOWVT: 6
TZST-0001SLOWVT: 4
TZST-0003SLOWVT: 30 J
TZST-0003SLOWVT: 36 J
VENTRICULAR PACING ICD: 0 pct
VF: 0

## 2012-09-26 NOTE — Progress Notes (Signed)
HPI [name] returns today for followup. He is a very pleasant 57 year old man with a history of chronic systolic heart failure, an ischemic cardiomyopathy, status post myocardial infarction, status post ICD implantation. In the interim, he has been stable from an arrhythmia perspective. He denies heart failure symptoms. He is working over 60 hours a week. He denies chest pain, shortness of breath, peripheral edema, or syncope.  Allergies  Allergen Reactions  . Plavix (Clopidogrel) Itching and Rash     Current Outpatient Prescriptions  Medication Sig Dispense Refill  . aspirin 325 MG tablet Take 325 mg by mouth daily.      . carvedilol (COREG) 6.25 MG tablet Take 1 tablet (6.25 mg total) by mouth 2 (two) times daily.  180 tablet  1  . CIALIS 20 MG tablet Take 20 mg by mouth as needed. As needed for erectile dysfunction.      . ramipril (ALTACE) 5 MG capsule Take 1 capsule (5 mg total) by mouth 2 (two) times daily.  60 capsule  3  . rosuvastatin (CRESTOR) 20 MG tablet Take 1 tablet (20 mg total) by mouth at bedtime.  90 tablet  3   No current facility-administered medications for this visit.     Past Medical History  Diagnosis Date  . HYPERLIPIDEMIA   . HYPERTENSION   . CAD   . CARDIOMYOPATHY, ISCHEMIC   . CHF   . Myocardial infarction   . Anginal pain   . Left ventricular dysfunction   . ICD (implantable cardiac defibrillator) in place 09/16/2011    ROS:   All systems reviewed and negative except as noted in the HPI.   Past Surgical History  Procedure Laterality Date  . Pt had ear surgery    . Icd  09/16/2011     Family History  Problem Relation Age of Onset  . Heart failure       History   Social History  . Marital Status: Married    Spouse Name: N/A    Number of Children: N/A  . Years of Education: N/A   Occupational History  . Not on file.   Social History Main Topics  . Smoking status: Former Smoker    Quit date: 09/16/1971  . Smokeless tobacco:  Never Used  . Alcohol Use: No  . Drug Use: No  . Sexually Active: Yes   Other Topics Concern  . Not on file   Social History Narrative  . No narrative on file     BP 120/68  Pulse 57  Ht 5\' 8"  (1.727 m)  Wt 176 lb 9.6 oz (80.105 kg)  BMI 26.86 kg/m2  Physical Exam:  Well appearing 57 year old man,NAD HEENT: Unremarkable Neck:  6 cm JVD, no thyromegally Lungs:  Clear with no wheezes, rales, or rhonchi. HEART:  Regular rate rhythm, no murmurs, no rubs, no clicks Abd:  soft, positive bowel sounds, no organomegally, no rebound, no guarding Ext:  2 plus pulses, no edema, no cyanosis, no clubbing Skin:  No rashes no nodules Neuro:  CN II through XII intact, motor grossly intact  DEVICE  Normal device function.  See PaceArt for details.   Assess/Plan:

## 2012-09-26 NOTE — Patient Instructions (Addendum)
Your physician wants you to follow-up in: 12 months with Dr. Ladona Ridgel. You will receive a reminder letter in the mail two months in advance. If you don't receive a letter, please call our office to schedule the follow-up appointment.   Remote monitoring is used to monitor your Pacemaker of ICD from home. This monitoring reduces the number of office visits required to check your device to one time per year. It allows [place] to keep an eye on the functioning of your device to ensure it is working properly. You are scheduled for a device check from home on 01/01/2013. You may send your transmission at any time that day. If you have a wireless device, the transmission will be sent automatically. After your physician reviews your transmission, you will receive a postcard with your next transmission date.

## 2012-09-26 NOTE — Assessment & Plan Note (Signed)
His blood pressure is well controlled. He'll continue his current medical therapy, and maintain a low-sodium diet. 

## 2012-09-26 NOTE — Assessment & Plan Note (Signed)
He denies anginal symptoms. I've encouraged the patient to increase his physical activity. No change in medical therapy.

## 2012-09-26 NOTE — Assessment & Plan Note (Signed)
His St. Jude single-chamber ICD is working normally. We'll plan to recheck in several months. 

## 2012-10-25 ENCOUNTER — Encounter: Payer: Self-pay | Admitting: Cardiology

## 2012-10-25 ENCOUNTER — Ambulatory Visit (INDEPENDENT_AMBULATORY_CARE_PROVIDER_SITE_OTHER): Payer: Managed Care, Other (non HMO) | Admitting: Cardiology

## 2012-10-25 VITALS — BP 110/80 | HR 56

## 2012-10-25 DIAGNOSIS — I10 Essential (primary) hypertension: Secondary | ICD-10-CM

## 2012-10-25 DIAGNOSIS — Z95810 Presence of automatic (implantable) cardiac defibrillator: Secondary | ICD-10-CM

## 2012-10-25 DIAGNOSIS — E785 Hyperlipidemia, unspecified: Secondary | ICD-10-CM

## 2012-10-25 DIAGNOSIS — I2510 Atherosclerotic heart disease of native coronary artery without angina pectoris: Secondary | ICD-10-CM

## 2012-10-25 DIAGNOSIS — I2589 Other forms of chronic ischemic heart disease: Secondary | ICD-10-CM

## 2012-10-25 NOTE — Assessment & Plan Note (Signed)
Blood pressure controlled. Continue present medications. Check potassium and renal function. 

## 2012-10-25 NOTE — Assessment & Plan Note (Signed)
Followed by electrophysiology. 

## 2012-10-25 NOTE — Assessment & Plan Note (Signed)
Continue statin. Check lipids and liver. 

## 2012-10-25 NOTE — Assessment & Plan Note (Signed)
Continue beta blocker and ACE inhibitor. 

## 2012-10-25 NOTE — Patient Instructions (Signed)
Your physician wants you to follow-up in: ONE YEAR WITH DR Jens Som IN [place] You will receive a reminder letter in the mail two months in advance. If you don't receive a letter, please call our office to schedule the follow-up appointment.   Your physician recommends that you return for lab work in: WHEN FASTING

## 2012-10-25 NOTE — Progress Notes (Signed)
   HPI: [name] [name] is a pleasant gentleman who has a history of coronary artery disease. He had an acute anterior infarct in 2003. At that time, he had PCI of his LAD and diagonal. Note, the Lcx and RCA had no disease. His last myoview in Dec 2011 showed EF 27%. There was a large anteroapical and anteroseptal infarct, but there was no ischemia. Echocardiogram in May of 2013 showed an ejection fraction of 30-35%. There was mild mitral regurgitation and trace aortic insufficiency. There was mild left atrial enlargement. Carotid Dopplers in June of 2009 showed normal carotids. Patient had ICD placed in June of 2013. I last saw him in April 2013. Since then, the patient denies any dyspnea on exertion, orthopnea, PND, pedal edema, palpitations, syncope or chest pain.   Current Outpatient Prescriptions  Medication Sig Dispense Refill  . aspirin 325 MG tablet Take 325 mg by mouth daily.      . carvedilol (COREG) 6.25 MG tablet Take 1 tablet (6.25 mg total) by mouth 2 (two) times daily.  180 tablet  1  . CIALIS 20 MG tablet Take 20 mg by mouth as needed. As needed for erectile dysfunction.      . ramipril (ALTACE) 5 MG capsule Take 1 capsule (5 mg total) by mouth 2 (two) times daily.  60 capsule  3  . rosuvastatin (CRESTOR) 20 MG tablet Take 1 tablet (20 mg total) by mouth at bedtime.  90 tablet  3   No current facility-administered medications for this visit.     Past Medical History  Diagnosis Date  . HYPERLIPIDEMIA   . HYPERTENSION   . CAD   . CARDIOMYOPATHY, ISCHEMIC   . CHF   . Myocardial infarction   . Anginal pain   . Left ventricular dysfunction   . 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 Pt had ear surgery    . Icd  09/16/2011    History   Social History  . Marital Status: Married    Spouse Name: N/A    Number of Children: N/A  . Years of Education: N/A   Occupational History  . Not on file.    Social History Main Topics  . Smoking status: Former Smoker    Quit date: 09/16/1971  . Smokeless tobacco: Never Used  . Alcohol Use: No  . Drug Use: No  . Sexually Active: Yes   Other Topics Concern  . Not on file   Social History Narrative  . No narrative on file    ROS: no fevers or chills, productive cough, hemoptysis, dysphasia, odynophagia, melena, hematochezia, dysuria, hematuria, rash, seizure activity, orthopnea, PND, pedal edema, claudication. Remaining systems are negative.  Physical Exam: Well-developed well-nourished in no acute distress.  Skin is warm and dry.  HEENT is normal.  Neck is supple.  Chest is clear to auscultation with normal expansion. ICD left chest. Cardiovascular exam is regular rate and rhythm.  Abdominal exam nontender or distended. No masses palpated. Extremities show no edema. neuro grossly intact  ECG sinus rhythm at a rate of 56. Prior septal infarct.

## 2012-10-25 NOTE — Assessment & Plan Note (Signed)
Continue aspirin and statin. Plan Myoview when he returns in one year.

## 2012-10-27 ENCOUNTER — Other Ambulatory Visit: Payer: Self-pay | Admitting: *Deleted

## 2012-10-27 MED ORDER — RAMIPRIL 5 MG PO CAPS: 5.0000 mg | ORAL_CAPSULE | Freq: Two times a day (BID) | ORAL | Status: DC

## 2012-11-04 LAB — LIPID PANEL
Cholesterol: 122 mg/dL (ref 0–200)
HDL: 42 mg/dL (ref >39)

## 2012-11-04 LAB — HEPATIC FUNCTION PANEL
ALT: 13 U/L (ref 0–53)
AST: 19 U/L (ref 0–37)
Alkaline Phosphatase: 49 U/L (ref 39–117)
Indirect Bilirubin: 0.5 mg/dL (ref 0.0–0.9)
Total Protein: 7 g/dL (ref 6.0–8.3)

## 2012-11-08 ENCOUNTER — Encounter: Payer: Self-pay | Admitting: *Deleted

## 2013-01-01 ENCOUNTER — Encounter: Payer: Self-pay | Admitting: Internal Medicine

## 2013-01-01 ENCOUNTER — Ambulatory Visit (INDEPENDENT_AMBULATORY_CARE_PROVIDER_SITE_OTHER): Payer: Managed Care, Other (non HMO) | Admitting: *Deleted

## 2013-01-01 DIAGNOSIS — I509 Heart failure, unspecified: Secondary | ICD-10-CM

## 2013-01-01 DIAGNOSIS — I2589 Other forms of chronic ischemic heart disease: Secondary | ICD-10-CM

## 2013-01-01 DIAGNOSIS — Z95810 Presence of automatic (implantable) cardiac defibrillator: Secondary | ICD-10-CM

## 2013-01-03 LAB — REMOTE ICD DEVICE
RV LEAD AMPLITUDE: 12 mv
RV LEAD IMPEDENCE ICD: 490 Ohm
TZAT-0004SLOWVT: 8
TZAT-0012SLOWVT: 200 ms
TZAT-0018SLOWVT: NEGATIVE
TZAT-0019SLOWVT: 7.5 V
TZON-0003SLOWVT: 310 ms
TZON-0004SLOWVT: 30
TZON-0010SLOWVT: 40 ms
TZST-0001SLOWVT: 3
TZST-0003SLOWVT: 36 J

## 2013-01-09 ENCOUNTER — Encounter: Payer: Self-pay | Admitting: *Deleted

## 2013-01-19 ENCOUNTER — Other Ambulatory Visit: Payer: Self-pay | Admitting: Cardiology

## 2013-04-05 ENCOUNTER — Encounter: Payer: Managed Care, Other (non HMO) | Admitting: *Deleted

## 2013-04-11 ENCOUNTER — Encounter: Payer: Self-pay | Admitting: *Deleted

## 2013-04-23 ENCOUNTER — Ambulatory Visit (INDEPENDENT_AMBULATORY_CARE_PROVIDER_SITE_OTHER): Payer: Managed Care, Other (non HMO) | Admitting: *Deleted

## 2013-04-23 ENCOUNTER — Encounter: Payer: Self-pay | Admitting: Internal Medicine

## 2013-04-23 DIAGNOSIS — I509 Heart failure, unspecified: Secondary | ICD-10-CM

## 2013-04-23 DIAGNOSIS — I428 Other cardiomyopathies: Secondary | ICD-10-CM

## 2013-04-24 LAB — MDC_IDC_ENUM_SESS_TYPE_REMOTE
Battery Remaining Longevity: 85 mo
Battery Voltage: 2.99 V
Brady Statistic RV Percent Paced: 1 %
Date Time Interrogation Session: 20150127024012
HIGH POWER IMPEDANCE MEASURED VALUE: 73 Ohm
HighPow Impedance: 73 Ohm
Implantable Pulse Generator Serial Number: 1033528
Lead Channel Impedance Value: 530 Ohm
Lead Channel Sensing Intrinsic Amplitude: 12 mV
Lead Channel Setting Sensing Sensitivity: 0.5 mV
MDC IDC MSMT BATTERY REMAINING PERCENTAGE: 82 %
MDC IDC MSMT LEADCHNL RV PACING THRESHOLD AMPLITUDE: 1 V
MDC IDC MSMT LEADCHNL RV PACING THRESHOLD PULSEWIDTH: 0.5 ms
MDC IDC SET LEADCHNL RV PACING AMPLITUDE: 2.5 V
MDC IDC SET LEADCHNL RV PACING PULSEWIDTH: 0.5 ms
MDC IDC SET ZONE DETECTION INTERVAL: 270 ms
Zone Setting Detection Interval: 310 ms

## 2013-05-07 ENCOUNTER — Other Ambulatory Visit: Payer: Self-pay | Admitting: Cardiology

## 2013-05-22 ENCOUNTER — Encounter: Payer: Self-pay | Admitting: *Deleted

## 2013-07-06 ENCOUNTER — Other Ambulatory Visit: Payer: Self-pay | Admitting: Internal Medicine

## 2013-07-25 ENCOUNTER — Ambulatory Visit (INDEPENDENT_AMBULATORY_CARE_PROVIDER_SITE_OTHER): Payer: Managed Care, Other (non HMO) | Admitting: *Deleted

## 2013-07-25 ENCOUNTER — Encounter: Payer: Self-pay | Admitting: Internal Medicine

## 2013-07-25 DIAGNOSIS — I428 Other cardiomyopathies: Secondary | ICD-10-CM

## 2013-08-01 LAB — MDC_IDC_ENUM_SESS_TYPE_REMOTE
Battery Remaining Longevity: 84 mo
Battery Remaining Percentage: 80 %
Battery Voltage: 2.98 V
Brady Statistic RV Percent Paced: 1 %
HIGH POWER IMPEDANCE MEASURED VALUE: 72 Ohm
HighPow Impedance: 72 Ohm
Implantable Pulse Generator Serial Number: 1033528
Lead Channel Impedance Value: 510 Ohm
Lead Channel Pacing Threshold Amplitude: 1 V
Lead Channel Sensing Intrinsic Amplitude: 12 mV
Lead Channel Setting Sensing Sensitivity: 0.5 mV
MDC IDC MSMT LEADCHNL RV PACING THRESHOLD PULSEWIDTH: 0.5 ms
MDC IDC SESS DTM: 20150429052239
MDC IDC SET LEADCHNL RV PACING AMPLITUDE: 2.5 V
MDC IDC SET LEADCHNL RV PACING PULSEWIDTH: 0.5 ms
MDC IDC SET ZONE DETECTION INTERVAL: 270 ms
MDC IDC SET ZONE DETECTION INTERVAL: 310 ms

## 2013-08-09 ENCOUNTER — Encounter: Payer: Self-pay | Admitting: Cardiology

## 2013-08-15 ENCOUNTER — Other Ambulatory Visit: Payer: Self-pay | Admitting: Cardiology

## 2013-08-22 ENCOUNTER — Encounter: Payer: Self-pay | Admitting: Internal Medicine

## 2013-09-18 ENCOUNTER — Telehealth: Payer: Self-pay | Admitting: Cardiology

## 2013-09-18 DIAGNOSIS — I2510 Atherosclerotic heart disease of native coronary artery without angina pectoris: Secondary | ICD-10-CM

## 2013-09-18 NOTE — Telephone Encounter (Signed)
New message     Faxed clearance for surgery over last week---did we receive it and will he be cleared?

## 2013-09-18 NOTE — Telephone Encounter (Signed)
Spoke with selita, aware I have the paperwork for signing.

## 2013-09-19 ENCOUNTER — Other Ambulatory Visit: Payer: Self-pay | Admitting: Urology

## 2013-09-21 NOTE — Telephone Encounter (Signed)
F/u    Pt calling in reference to scheduling a stress test. Please call pt.

## 2013-09-21 NOTE — Telephone Encounter (Signed)
Left message for selita, clearance reviewed by dr [name] and pt will need nuclear stress test prior to clearance. Left message for pt to call to get stress testing scheduled.

## 2013-09-21 NOTE — Telephone Encounter (Signed)
Spoke with pt, execise myoview scheduled for pre-op clearance Instructions discussed over the phone Pt is going to call [facility]

## 2013-09-24 ENCOUNTER — Encounter: Payer: Self-pay | Admitting: Cardiology

## 2013-09-25 ENCOUNTER — Ambulatory Visit (HOSPITAL_COMMUNITY): Payer: Managed Care, Other (non HMO) | Attending: Cardiology | Admitting: Radiology

## 2013-09-25 VITALS — BP 114/77 | Ht 68.0 in | Wt 182.0 lb

## 2013-09-25 DIAGNOSIS — Z9861 Coronary angioplasty status: Secondary | ICD-10-CM | POA: Insufficient documentation

## 2013-09-25 DIAGNOSIS — Z87891 Personal history of nicotine dependence: Secondary | ICD-10-CM | POA: Insufficient documentation

## 2013-09-25 DIAGNOSIS — I2510 Atherosclerotic heart disease of native coronary artery without angina pectoris: Secondary | ICD-10-CM

## 2013-09-25 DIAGNOSIS — Z95810 Presence of automatic (implantable) cardiac defibrillator: Secondary | ICD-10-CM | POA: Insufficient documentation

## 2013-09-25 DIAGNOSIS — I428 Other cardiomyopathies: Secondary | ICD-10-CM | POA: Insufficient documentation

## 2013-09-25 DIAGNOSIS — I10 Essential (primary) hypertension: Secondary | ICD-10-CM | POA: Insufficient documentation

## 2013-09-25 DIAGNOSIS — I252 Old myocardial infarction: Secondary | ICD-10-CM | POA: Insufficient documentation

## 2013-09-25 MED ORDER — TECHNETIUM TC 99M SESTAMIBI GENERIC - CARDIOLITE
33.0000 | Freq: Once | INTRAVENOUS | Status: AC | PRN
  Administered 2013-09-25: 33 via INTRAVENOUS

## 2013-09-25 MED ORDER — TECHNETIUM TC 99M SESTAMIBI GENERIC - CARDIOLITE
10.8000 | Freq: Once | INTRAVENOUS | Status: AC | PRN
  Administered 2013-09-25: 11 via INTRAVENOUS

## 2013-09-25 MED ORDER — REGADENOSON 0.4 MG/5ML IV SOLN
0.4000 mg | Freq: Once | INTRAVENOUS | Status: AC
  Administered 2013-09-25: 0.4 mg via INTRAVENOUS

## 2013-09-25 NOTE — Progress Notes (Signed)
[facility] [address] [place], [place] [address] [phone]    Cardiology Nuclear Med Study  [name] [name] is a 58 y.o. male     MRN : [id number]     DOB: 14-Jun-1955  Procedure Date: 09/25/2013  Nuclear Med Background Indication for Stress Test:  Evaluation for Ischemia, PTCA/Stent Patency, and Surgical Clearance for Bladder Tumor Removal on 10-03-2013 by Rolan Bucco, MD History: CAD;MI;Cath;Stent ;PTCA LAD&Diag;AICD;Cardiomypthy;Echo 36' EF:30-35%;Previous Nuclear Study 11',scar EF:27%/anteroapical&anteroseptal  Cardiac Risk Factors: History of Smoking, Hypertension and Lipids  Symptoms:  No symptoms   Nuclear Pre-Procedure Caffeine/Decaff Intake:  None> 12 hrs NPO After: 8:00pm   Lungs:  clear O2 Sat: 97% on room air. IV 0.9% NS with Angio Cath:  20g  IV Site: R Antecubital x 1, tolerated well IV Started by:  Irven Baltimore, RN  Chest Size (in):  42 Cup Size: n/a  Height: 5\' 8"  (1.727 m)  Weight:  182 lb (82.555 kg)  BMI:  Body mass index is 27.68 kg/(m^2). Tech Comments:  Patient took Coreg, Altace, and ASA this am. Allen Derry.    Nuclear Med Study 1 or 2 day study: 1 day  Stress Test Type:  Carlton Adam  Reading MD: N/A  Order Authorizing Provider:  Kirk Ruths, MD  Resting Radionuclide: Technetium 73m Sestamibi  Resting Radionuclide Dose: 11.0 mCi   Stress Radionuclide:  Technetium 85m Sestamibi  Stress Radionuclide Dose: 33.0 mCi           Stress Protocol Rest HR: 52 Stress HR: 80  Rest BP: 114/77 Stress BP: 122/87  Exercise Time (min): n/a METS: n/a   Predicted Max HR: 160 bpm % Max HR: 50 bpm Rate Pressure Product: 9760   Dose of Adenosine (mg):  n/a Dose of Lexiscan: 0.4 mg  Dose of Atropine (mg): n/a Dose of Dobutamine: n/a mcg/kg/min (at max HR)  Stress Test Technologist: Ileene Hutchinson, EMT-P  Nuclear Technologist:  Charlton Amor, CNMT     Rest Procedure:  Myocardial perfusion imaging was performed at rest  45 minutes following the intravenous administration of Technetium 34m Sestamibi. Rest ECG: SB, anterior infarct, age undetermined, negative T waves in the lateral leads  Stress Procedure:  The patient received IV Lexiscan 0.4 mg over 15-seconds.  Technetium 32m Sestamibi injected at 30-seconds.  Quantitative spect images were obtained after a 45 minute delay. Stress ECG: No significant change from baseline ECG  QPS Raw Data Images:  Normal; no motion artifact; normal heart/lung ratio. Stress Images:  There is a large perfusion defect in the entire anterior, basal and mid anterolateral, inferolteral walls, apical septal and in the true apex.  Rest Images:  There is a large perfusion defect in the entire anterior, basal and mid anterolateral, inferolteral walls, apical septal and in the true apex.  Subtraction (SDS):  A large size, severe severity irreversible defect in the entire LAD territory. Minimal periinfarct ischemia in the mid anterolateral wall. SDS 2. Transient Ischemic Dilatation (Normal <1.22):  1.01 Lung/Heart Ratio (Normal <0.45):  0.35  Quantitative Gated Spect Images QGS EDV:  208 ml QGS ESV:  140 ml  Impression Exercise Capacity:  Lexiscan with no exercise. BP Response:  Normal blood pressure response. Clinical Symptoms:  Chest pressure, cough ECG Impression:  No significant ST segment change suggestive of ischemia. Comparison with Prior Nuclear Study: No significant change from previous study  Overall Impression:  High risk stress nuclear study with a large scar in the entire LAD territory with  minimal periinfarct ischemia and severe LV dysfunction. SDS 2.   LV Ejection Fraction: 33%.  LV Wall Motion:  Akinesis of the entire anterior wall, entire septum and the apex.   Dorothy Spark 09/25/2013

## 2013-09-27 ENCOUNTER — Telehealth: Payer: Self-pay | Admitting: Cardiology

## 2013-09-27 ENCOUNTER — Encounter: Payer: Managed Care, Other (non HMO) | Admitting: Internal Medicine

## 2013-09-27 NOTE — Telephone Encounter (Signed)
Received request from Nurse fax box, documents faxed for surgical clearance. To: [facility] Fax number: [phone] Attention: 7.2.15/km

## 2013-09-27 NOTE — Telephone Encounter (Signed)
° °  [name] [name] with [facility] called she needs surgical clearance faxed over to her attention. Fax # is [phone]. Please advise.

## 2013-09-27 NOTE — Telephone Encounter (Signed)
Paperwork in medical records and has been faxed

## 2013-10-01 NOTE — Patient Instructions (Signed)
20     Your procedure is scheduled on:  Wednesday 10/03/2013  Report to [facility] Main Entrance and follow signs to Short Stay  at  0630 AM.  Call this number if you have problems the night before or morning of surgery:  [phone]   Remember:             IF YOU USE CPAP,BRING MASK AND TUBING AM OF SURGERY!             IF YOU DO NOT HAVE YOUR TYPE AND SCREEN DRAWN AT PRE-ADMIT APPOINTMENT, YOU WILL HAVE IT DRAWN AM OF SURGERY!   Do not eat food or drink liquids AFTER MIDNIGHT!  Take these medicines the morning of surgery with A SIP OF WATER:  [facility] [facility] IS NOT RESPONSIBLE FOR ANY BELONGINGS OR VALUABLES BROUGHT TO HOSPITAL.  Marland Kitchen  Leave suitcase in the car. After surgery it may be brought to your room.  For patients admitted to the hospital, checkout time is 11:00 AM the day of              Discharge.    DO NOT WEAR  JEWELRY,MAKE-UP,LOTIONS,POWDERS,PERFUMES,CONTACTS , DENTURES OR BRIDGEWORK ,AND DO NOT WEAR FALSE EYELASHES                                    Patients discharged the day of surgery will not be allowed to drive home.  If going home the same day of surgery, must have someone stay with you first 24 hrs.at home and arrange for someone to drive you home from the [place]:   Special Instructions:              Please read over the following fact sheets that you were given:             1. [place].Tobin Chad     [phone]                              [facility] - Preparing for Surgery Before surgery, you can play an important role.  Because skin is not sterile, your skin needs to be as free of germs as possible.  You can reduce the number of germs on your skin by washing with CHG (chlorahexidine gluconate) soap before surgery.  CHG is an antiseptic cleaner which kills germs and bonds with the skin  to continue killing germs even after washing. Please DO NOT use if you have an allergy to CHG or antibacterial soaps.  If your skin becomes reddened/irritated stop using the CHG and inform your nurse when you arrive at Short Stay. Do not shave (including legs and underarms) for at least 48 hours prior to the first CHG shower.  You may shave your face/neck. Please follow these instructions carefully:  1.  Shower with CHG Soap the night  before surgery and the  morning of Surgery.  2.  If you choose to wash your hair, wash your hair first as usual with your  normal  shampoo.  3.  After you shampoo, rinse your hair and body thoroughly to remove the  shampoo.                           4.  Use CHG as you would any other liquid soap.  You can apply chg directly  to the skin and wash                       Gently with a scrungie or clean washcloth.  5.  Apply the CHG Soap to your body ONLY FROM THE NECK DOWN.   Do not use on face/ open                           Wound or open sores. Avoid contact with eyes, ears mouth and genitals (private parts).                       Wash face,  Genitals (private parts) with your normal soap.             6.  Wash thoroughly, paying special attention to the area where your surgery  will be performed.  7.  Thoroughly rinse your body with warm water from the neck down.  8.  DO NOT shower/wash with your normal soap after using and rinsing off  the CHG Soap.                9.  Pat yourself dry with a clean towel.            10.  Wear clean pajamas.            11.  Place clean sheets on your bed the night of your first shower and do not  sleep with pets. Day of Surgery : Do not apply any lotions/deodorants the morning of surgery.  Please wear clean clothes to the hospital/surgery center.  FAILURE TO FOLLOW THESE INSTRUCTIONS MAY RESULT IN THE CANCELLATION OF YOUR SURGERY PATIENT SIGNATURE_________________________________  NURSE  SIGNATURE__________________________________  ________________________________________________________________________   [name] [name]  An incentive spirometer is a tool that can help keep your lungs clear and active. This tool measures how well you are filling your lungs with each breath. Taking long deep breaths may help reverse or decrease the chance of developing breathing (pulmonary) problems (especially infection) following:  A long period of time when you are unable to move or be active. BEFORE THE PROCEDURE   If the spirometer includes an indicator to show your best effort, your nurse or respiratory therapist will set it to a desired goal.  If possible, sit up straight or lean slightly forward. Try not to slouch.  Hold the incentive spirometer in an upright position. INSTRUCTIONS FOR USE  1. Sit on the edge of your bed if possible, or sit up as far as you can in bed or on a chair. 2. Hold the incentive spirometer in an upright position. 3. Breathe out normally. 4. Place the mouthpiece in your mouth and seal your lips tightly around it. 5. Breathe in slowly and as deeply as possible, raising the piston or the ball toward the top of the column. 6. Hold your breath for 3-5 seconds or  for as long as possible. Allow the piston or ball to fall to the bottom of the column. 7. Remove the mouthpiece from your mouth and breathe out normally. 8. Rest for a few seconds and repeat Steps 1 through 7 at least 10 times every 1-2 hours when you are awake. Take your time and take a few normal breaths between deep breaths. 9. The spirometer may include an indicator to show your best effort. Use the indicator as a goal to work toward during each repetition. 10. After each set of 10 deep breaths, practice coughing to be sure your lungs are clear. If you have an incision (the cut made at the time of surgery), support your incision when coughing by placing a pillow or rolled up towels firmly  against it. Once you are able to get out of bed, walk around indoors and cough well. You may stop using the incentive spirometer when instructed by your caregiver.  RISKS AND COMPLICATIONS  Take your time so you do not get dizzy or light-headed.  If you are in pain, you may need to take or ask for pain medication before doing incentive spirometry. It is harder to take a deep breath if you are having pain. AFTER USE  Rest and breathe slowly and easily.  It can be helpful to keep track of a log of your progress. Your caregiver can provide you with a simple table to help with this. If you are using the spirometer at home, follow these instructions: [place] IF:   You are having difficultly using the spirometer.  You have trouble using the spirometer as often as instructed.  Your pain medication is not giving enough relief while using the spirometer.  You develop fever of 100.5 F (38.1 C) or higher. SEEK IMMEDIATE MEDICAL CARE IF:   You cough up bloody sputum that had not been present before.  You develop fever of 102 F (38.9 C) or greater.  You develop worsening pain at or near the incision site. MAKE SURE YOU:   Understand these instructions.  Will watch your condition.  Will get help right away if you are not doing well or get worse. Document Released: 07/26/2006 Document Revised: 06/07/2011 Document Reviewed: 09/26/2006 [facility] Patient Information 2014 [place], [place].   ________________________________________________________________________

## 2013-10-02 ENCOUNTER — Ambulatory Visit (HOSPITAL_COMMUNITY)
Admission: RE | Admit: 2013-10-02 | Discharge: 2013-10-02 | Disposition: A | Discharge status: Home / Self Care | Payer: Managed Care, Other (non HMO) | Source: Ambulatory Visit | Attending: Anesthesiology | Admitting: Anesthesiology

## 2013-10-02 ENCOUNTER — Encounter: Payer: Self-pay | Admitting: Internal Medicine

## 2013-10-02 ENCOUNTER — Ambulatory Visit (INDEPENDENT_AMBULATORY_CARE_PROVIDER_SITE_OTHER): Payer: Managed Care, Other (non HMO) | Admitting: Internal Medicine

## 2013-10-02 ENCOUNTER — Encounter (HOSPITAL_COMMUNITY): Payer: Self-pay

## 2013-10-02 ENCOUNTER — Encounter (HOSPITAL_COMMUNITY)
Admission: RE | Admit: 2013-10-02 | Discharge: 2013-10-02 | Disposition: A | Discharge status: Home / Self Care | Payer: Managed Care, Other (non HMO) | Source: Ambulatory Visit | Attending: Urology | Admitting: Urology

## 2013-10-02 ENCOUNTER — Encounter (INDEPENDENT_AMBULATORY_CARE_PROVIDER_SITE_OTHER): Payer: Self-pay

## 2013-10-02 ENCOUNTER — Encounter (HOSPITAL_COMMUNITY): Payer: Self-pay | Admitting: Pharmacy Technician

## 2013-10-02 VITALS — BP 104/68 | HR 74 | Ht 68.0 in | Wt 188.0 lb

## 2013-10-02 DIAGNOSIS — I2589 Other forms of chronic ischemic heart disease: Secondary | ICD-10-CM

## 2013-10-02 DIAGNOSIS — Z95810 Presence of automatic (implantable) cardiac defibrillator: Secondary | ICD-10-CM

## 2013-10-02 DIAGNOSIS — I509 Heart failure, unspecified: Secondary | ICD-10-CM

## 2013-10-02 LAB — MDC_IDC_ENUM_SESS_TYPE_INCLINIC
Battery Remaining Longevity: 82.8 mo
Brady Statistic RV Percent Paced: 0 %
HighPow Impedance: 67.5 Ohm
Lead Channel Pacing Threshold Amplitude: 1 V
Lead Channel Pacing Threshold Pulse Width: 0.5 ms
Lead Channel Sensing Intrinsic Amplitude: 12 mV
Lead Channel Setting Pacing Amplitude: 2.5 V
Lead Channel Setting Pacing Pulse Width: 0.5 ms
MDC IDC MSMT LEADCHNL RV IMPEDANCE VALUE: 512.5 Ohm
MDC IDC MSMT LEADCHNL RV PACING THRESHOLD AMPLITUDE: 1 V
MDC IDC MSMT LEADCHNL RV PACING THRESHOLD PULSEWIDTH: 0.5 ms
MDC IDC PG SERIAL: 1033528
MDC IDC SESS DTM: 20150707155046
MDC IDC SET LEADCHNL RV SENSING SENSITIVITY: 0.5 mV
MDC IDC SET ZONE DETECTION INTERVAL: 310 ms
Zone Setting Detection Interval: 270 ms

## 2013-10-02 LAB — BASIC METABOLIC PANEL
ANION GAP: 12 (ref 5–15)
BUN: 18 mg/dL (ref 6–23)
CO2: 24 meq/L (ref 19–32)
CREATININE: 0.97 mg/dL (ref 0.50–1.35)
Calcium: 9.2 mg/dL (ref 8.4–10.5)
Chloride: 103 mEq/L (ref 96–112)
GFR calc Af Amer: >90 mL/min (ref >90)
GFR calc non Af Amer: 88 mL/min — ABNORMAL LOW (ref >90)
Glucose, Bld: 89 mg/dL (ref 70–99)
Potassium: 3.7 mEq/L (ref 3.7–5.3)
SODIUM: 139 meq/L (ref 137–147)

## 2013-10-02 LAB — CBC
HEMATOCRIT: 37.3 % — AB (ref 39.0–52.0)
Hemoglobin: 12.7 g/dL — ABNORMAL LOW (ref 13.0–17.0)
MCH: 30.2 pg (ref 26.0–34.0)
MCHC: 34 g/dL (ref 30.0–36.0)
MCV: 88.6 fL (ref 78.0–100.0)
Platelets: 181 10*3/uL (ref 150–400)
RBC: 4.21 MIL/uL — ABNORMAL LOW (ref 4.22–5.81)
RDW: 12.5 % (ref 11.5–15.5)
WBC: 4.6 10*3/uL (ref 4.0–10.5)

## 2013-10-02 NOTE — Progress Notes (Signed)
HPI [name] [name] returns today for followup. He is a very pleasant 58 year old man with a history of chronic systolic heart failure, an ischemic cardiomyopathy, status post myocardial infarction, status post ICD implantation. In the interim, he has been stable from an arrhythmia perspective. He denies heart failure symptoms. He is working over 60 hours a week. He denies chest pain, shortness of breath, peripheral edema, or syncope.  He is pending bladder surgery. Allergies  Allergen Reactions  . Plavix [Clopidogrel] Itching and Rash     Current Outpatient Prescriptions  Medication Sig Dispense Refill  . aspirin 325 MG tablet Take 325 mg by mouth daily.      . carvedilol (COREG) 6.25 MG tablet Take 6.25 mg by mouth 2 (two) times daily with a meal.      . CIALIS 20 MG tablet Take 20 mg by mouth as needed. As needed for erectile dysfunction.      . ramipril (ALTACE) 5 MG capsule Take 5 mg by mouth 2 (two) times daily.      . rosuvastatin (CRESTOR) 20 MG tablet Take 20 mg by mouth daily.       No current facility-administered medications for this visit.     Past Medical History  Diagnosis Date  . HYPERLIPIDEMIA   . HYPERTENSION   . CAD   . CARDIOMYOPATHY, ISCHEMIC   . CHF   . Myocardial infarction   . Anginal pain   . Left ventricular dysfunction   . ICD (implantable cardiac defibrillator) in place 09/16/2011    ROS:   All systems reviewed and negative except as noted in the HPI.   Past Surgical History  Procedure Laterality Date  . Pt had ear surgery    . Icd  09/16/2011  . Hernia repair  2010    right inguinal     Family History  Problem Relation Age of Onset  . Heart failure       History   Social History  . Marital Status: Married    Spouse Name: N/A    Number of Children: N/A  . Years of Education: N/A   Occupational History  . Not on file.   Social History Main Topics  . Smoking status: Former Smoker    Types: Cigarettes    Quit date: 09/16/1971  .  Smokeless tobacco: Never Used  . Alcohol Use: No  . Drug Use: No  . Sexual Activity: Yes   Other Topics Concern  . Not on file   Social History Narrative  . No narrative on file     BP 104/68  Pulse 74  Ht 5\' 8"  (1.727 m)  Wt 188 lb (85.276 kg)  BMI 28.59 kg/m2  Physical Exam:  Well appearing 58 year old man,NAD HEENT: Unremarkable Neck:  6 cm JVD, no thyromegally Lungs:  Clear with no wheezes, rales, or rhonchi. HEART:  Regular rate rhythm, no murmurs, no rubs, no clicks Abd:  soft, positive bowel sounds, no organomegally, no rebound, no guarding Ext:  2 plus pulses, no edema, no cyanosis, no clubbing Skin:  No rashes no nodules Neuro:  CN II through XII intact, motor grossly intact  DEVICE  Normal device function.  See PaceArt for details.   Assess/Plan:

## 2013-10-02 NOTE — Patient Instructions (Signed)
Your physician wants you to follow-up in: 12 months with Dr. Lovena Le.  You will receive a reminder letter in the mail two months in advance. If you don't receive a letter, please call our office to schedule the follow-up appointment.  Remote monitoring is used to monitor your Pacemaker of ICD from home. This monitoring reduces the number of office visits required to check your device to one time per year. It allows [place] to keep an eye on the functioning of your device to ensure it is working properly. You are scheduled for a device check from home on January 03, 2014   You may send your transmission at any time that day. If you have a wireless device, the transmission will be sent automatically. After your physician reviews your transmission, you will receive a postcard with your next transmission date.

## 2013-10-02 NOTE — Assessment & Plan Note (Signed)
He denies c/p or sob. He will continue his current meds. He is low risk for major cardiovascular complications from his pending bladder surgery.

## 2013-10-02 NOTE — Assessment & Plan Note (Signed)
His St. Jude ICD is working normally. Will recheck in several months.  

## 2013-10-02 NOTE — Progress Notes (Signed)
10/02/13 1511  OBSTRUCTIVE SLEEP APNEA  Have you ever been diagnosed with sleep apnea through a sleep study? No  Do you snore loudly (loud enough to be heard through closed doors)?  1  Do you often feel tired, fatigued, or sleepy during the daytime? 1  Has anyone observed you stop breathing during your sleep? 0  Do you have, or are you being treated for high blood pressure? 1  BMI more than 35 kg/m2? 0  Age over 60 years old? 1  Neck circumference greater than 40 cm/16 inches? 1  Gender: 1  Obstructive Sleep Apnea Score 6  Score 4 or greater  Results sent to PCP

## 2013-10-03 ENCOUNTER — Ambulatory Visit (HOSPITAL_COMMUNITY): Payer: Managed Care, Other (non HMO) | Admitting: Certified Registered"

## 2013-10-03 ENCOUNTER — Ambulatory Visit (HOSPITAL_COMMUNITY)
Admission: RE | Admit: 2013-10-03 | Discharge: 2013-10-03 | Disposition: A | Discharge status: Home / Self Care | Payer: Managed Care, Other (non HMO) | Source: Ambulatory Visit | Attending: Urology | Admitting: Urology

## 2013-10-03 ENCOUNTER — Encounter (HOSPITAL_COMMUNITY): Payer: Self-pay | Admitting: *Deleted

## 2013-10-03 ENCOUNTER — Encounter (HOSPITAL_COMMUNITY)
Admission: RE | Disposition: A | Discharge status: Home / Self Care | Payer: Self-pay | Source: Ambulatory Visit | Attending: Urology

## 2013-10-03 ENCOUNTER — Encounter (HOSPITAL_COMMUNITY): Payer: Managed Care, Other (non HMO) | Admitting: Certified Registered"

## 2013-10-03 DIAGNOSIS — I252 Old myocardial infarction: Secondary | ICD-10-CM | POA: Insufficient documentation

## 2013-10-03 DIAGNOSIS — I10 Essential (primary) hypertension: Secondary | ICD-10-CM | POA: Insufficient documentation

## 2013-10-03 DIAGNOSIS — C679 Malignant neoplasm of bladder, unspecified: Secondary | ICD-10-CM | POA: Insufficient documentation

## 2013-10-03 DIAGNOSIS — D49519 Neoplasm of unspecified behavior of unspecified kidney: Secondary | ICD-10-CM

## 2013-10-03 DIAGNOSIS — E785 Hyperlipidemia, unspecified: Secondary | ICD-10-CM | POA: Insufficient documentation

## 2013-10-03 DIAGNOSIS — Z7982 Long term (current) use of aspirin: Secondary | ICD-10-CM | POA: Insufficient documentation

## 2013-10-03 DIAGNOSIS — Z87891 Personal history of nicotine dependence: Secondary | ICD-10-CM | POA: Insufficient documentation

## 2013-10-03 DIAGNOSIS — N529 Male erectile dysfunction, unspecified: Secondary | ICD-10-CM | POA: Insufficient documentation

## 2013-10-03 DIAGNOSIS — Z95810 Presence of automatic (implantable) cardiac defibrillator: Secondary | ICD-10-CM | POA: Insufficient documentation

## 2013-10-03 DIAGNOSIS — D494 Neoplasm of unspecified behavior of bladder: Secondary | ICD-10-CM

## 2013-10-03 DIAGNOSIS — I2510 Atherosclerotic heart disease of native coronary artery without angina pectoris: Secondary | ICD-10-CM | POA: Insufficient documentation

## 2013-10-03 DIAGNOSIS — Z79899 Other long term (current) drug therapy: Secondary | ICD-10-CM | POA: Insufficient documentation

## 2013-10-03 DIAGNOSIS — I509 Heart failure, unspecified: Secondary | ICD-10-CM | POA: Insufficient documentation

## 2013-10-03 DIAGNOSIS — Z888 Allergy status to other drugs, medicaments and biological substances status: Secondary | ICD-10-CM | POA: Insufficient documentation

## 2013-10-03 HISTORY — PX: CYSTOSCOPY W/ URETERAL STENT PLACEMENT: SHX1429

## 2013-10-03 HISTORY — PX: TRANSURETHRAL RESECTION OF BLADDER TUMOR WITH GYRUS (TURBT-GYRUS): SHX6458

## 2013-10-03 SURGERY — TRANSURETHRAL RESECTION OF BLADDER TUMOR WITH GYRUS (TURBT-GYRUS)
Anesthesia: General | Site: Bladder

## 2013-10-03 MED ORDER — HYOSCYAMINE SULFATE 0.125 MG PO TABS
0.1250 mg | ORAL_TABLET | ORAL | Status: DC | PRN
Start: 2013-10-03 — End: 2013-11-09

## 2013-10-03 MED ORDER — LIDOCAINE HCL (CARDIAC) 20 MG/ML IV SOLN
INTRAVENOUS | Status: AC
  Filled 2013-10-03: qty 5

## 2013-10-03 MED ORDER — PROPOFOL 10 MG/ML IV BOLUS
INTRAVENOUS | Status: DC | PRN
  Administered 2013-10-03: 180 mg via INTRAVENOUS

## 2013-10-03 MED ORDER — MIDAZOLAM HCL 2 MG/2ML IJ SOLN
INTRAMUSCULAR | Status: AC
  Filled 2013-10-03: qty 2

## 2013-10-03 MED ORDER — OXYBUTYNIN CHLORIDE 5 MG PO TABS: 5.0000 mg | ORAL_TABLET | Freq: Four times a day (QID) | ORAL | Status: DC | PRN

## 2013-10-03 MED ORDER — PROMETHAZINE HCL 25 MG/ML IJ SOLN: 6.2500 mg | INTRAMUSCULAR | Status: DC | PRN

## 2013-10-03 MED ORDER — CEFAZOLIN SODIUM-DEXTROSE 2-3 GM-% IV SOLR
INTRAVENOUS | Status: AC
  Filled 2013-10-03: qty 50

## 2013-10-03 MED ORDER — ONDANSETRON HCL 4 MG/2ML IJ SOLN
INTRAMUSCULAR | Status: AC
  Filled 2013-10-03: qty 2

## 2013-10-03 MED ORDER — ONDANSETRON HCL 4 MG/2ML IJ SOLN
INTRAMUSCULAR | Status: DC | PRN
  Administered 2013-10-03: 4 mg via INTRAVENOUS

## 2013-10-03 MED ORDER — LACTATED RINGERS IV SOLN
INTRAVENOUS | Status: DC | PRN
  Administered 2013-10-03 (×2): via INTRAVENOUS

## 2013-10-03 MED ORDER — SENNOSIDES-DOCUSATE SODIUM 8.6-50 MG PO TABS: 1.0000 | ORAL_TABLET | Freq: Two times a day (BID) | ORAL | Status: DC

## 2013-10-03 MED ORDER — PHENAZOPYRIDINE HCL 200 MG PO TABS
200.0000 mg | ORAL_TABLET | Freq: Once | ORAL | Status: AC
  Administered 2013-10-03: 200 mg via ORAL
  Filled 2013-10-03: qty 1

## 2013-10-03 MED ORDER — LACTATED RINGERS IV SOLN: INTRAVENOUS | Status: DC

## 2013-10-03 MED ORDER — FENTANYL CITRATE 0.05 MG/ML IJ SOLN
INTRAMUSCULAR | Status: DC | PRN
  Administered 2013-10-03: 25 ug via INTRAVENOUS
  Administered 2013-10-03: 50 ug via INTRAVENOUS
  Administered 2013-10-03: 25 ug via INTRAVENOUS

## 2013-10-03 MED ORDER — CIPROFLOXACIN HCL 500 MG PO TABS: 500.0000 mg | ORAL_TABLET | Freq: Two times a day (BID) | ORAL | Status: DC

## 2013-10-03 MED ORDER — HYDROMORPHONE HCL PF 1 MG/ML IJ SOLN
0.2500 mg | INTRAMUSCULAR | Status: DC | PRN
  Administered 2013-10-03 (×3): 0.5 mg via INTRAVENOUS

## 2013-10-03 MED ORDER — HYDROMORPHONE HCL PF 1 MG/ML IJ SOLN
INTRAMUSCULAR | Status: AC
  Filled 2013-10-03: qty 1

## 2013-10-03 MED ORDER — SODIUM CHLORIDE 0.9 % IR SOLN
Status: DC | PRN
  Administered 2013-10-03: 9000 mL

## 2013-10-03 MED ORDER — PROPOFOL 10 MG/ML IV BOLUS
INTRAVENOUS | Status: AC
  Filled 2013-10-03: qty 20

## 2013-10-03 MED ORDER — BELLADONNA ALKALOIDS-OPIUM 16.2-60 MG RE SUPP
RECTAL | Status: AC
  Filled 2013-10-03: qty 1

## 2013-10-03 MED ORDER — PHENAZOPYRIDINE HCL 200 MG PO TABS
200.0000 mg | ORAL_TABLET | Freq: Three times a day (TID) | ORAL | Status: DC | PRN
Start: 2013-10-03 — End: 2013-11-07

## 2013-10-03 MED ORDER — BELLADONNA ALKALOIDS-OPIUM 16.2-60 MG RE SUPP
RECTAL | Status: DC | PRN
  Administered 2013-10-03: 1 via RECTAL

## 2013-10-03 MED ORDER — FENTANYL CITRATE 0.05 MG/ML IJ SOLN
INTRAMUSCULAR | Status: AC
  Filled 2013-10-03: qty 2

## 2013-10-03 MED ORDER — EPHEDRINE SULFATE 50 MG/ML IJ SOLN
INTRAMUSCULAR | Status: DC | PRN
  Administered 2013-10-03 (×2): 5 mg via INTRAVENOUS

## 2013-10-03 MED ORDER — CEFAZOLIN SODIUM-DEXTROSE 2-3 GM-% IV SOLR
2.0000 g | INTRAVENOUS | Status: AC
  Administered 2013-10-03: 2 g via INTRAVENOUS

## 2013-10-03 MED ORDER — MIDAZOLAM HCL 5 MG/5ML IJ SOLN
INTRAMUSCULAR | Status: DC | PRN
  Administered 2013-10-03: 2 mg via INTRAVENOUS

## 2013-10-03 MED ORDER — LIDOCAINE HCL 2 % EX GEL
CUTANEOUS | Status: AC
  Filled 2013-10-03: qty 10

## 2013-10-03 MED ORDER — EPHEDRINE SULFATE 50 MG/ML IJ SOLN
INTRAMUSCULAR | Status: AC
  Filled 2013-10-03: qty 1

## 2013-10-03 MED ORDER — OXYCODONE-ACETAMINOPHEN 5-325 MG PO TABS: 1.0000 | ORAL_TABLET | ORAL | Status: DC | PRN

## 2013-10-03 MED ORDER — LIDOCAINE HCL (CARDIAC) 20 MG/ML IV SOLN
INTRAVENOUS | Status: DC | PRN
  Administered 2013-10-03: 50 mg via INTRAVENOUS

## 2013-10-03 MED ORDER — LIDOCAINE HCL 2 % EX GEL
CUTANEOUS | Status: DC | PRN
  Administered 2013-10-03: 1 via URETHRAL

## 2013-10-03 SURGICAL SUPPLY — 26 items
BAG URINE DRAINAGE (UROLOGICAL SUPPLIES) ×5 IMPLANT
BAG URO CATCHER STRL LF (DRAPE) ×5 IMPLANT
CATH FOLEY 2WAY 5CC 20FR (CATHETERS) ×5 IMPLANT
CATH URET 5FR 28IN OPEN ENDED (CATHETERS) ×5 IMPLANT
CATH URET DUAL LUMEN 6-10FR 50 (CATHETERS) ×5 IMPLANT
DRAPE CAMERA CLOSED 9X96 (DRAPES) ×5 IMPLANT
ELECT BUTTON HF 24-28F 2 30DE (ELECTRODE) IMPLANT
ELECT LOOP MED HF 24F 12D (CUTTING LOOP) ×5 IMPLANT
ELECT LOOP MED HF 24F 12D CBL (CLIP) ×5 IMPLANT
ELECT REM PT RETURN 9FT ADLT (ELECTROSURGICAL) ×5
ELECT RESECT VAPORIZE 12D CBL (ELECTRODE) IMPLANT
ELECTRODE REM PT RTRN 9FT ADLT (ELECTROSURGICAL) ×3 IMPLANT
EVACUATOR MICROVAS BLADDER (UROLOGICAL SUPPLIES) IMPLANT
GLOVE BIOGEL PI IND STRL 7.5 (GLOVE) ×6 IMPLANT
GLOVE BIOGEL PI INDICATOR 7.5 (GLOVE) ×4
GLOVE ECLIPSE 7.0 STRL STRAW (GLOVE) ×10 IMPLANT
GOWN STRL REUS W/TWL XL LVL3 (GOWN DISPOSABLE) ×10 IMPLANT
GUIDEWIRE STR DUAL SENSOR (WIRE) ×5 IMPLANT
KIT ASPIRATION TUBING (SET/KITS/TRAYS/PACK) ×5 IMPLANT
MANIFOLD NEPTUNE II (INSTRUMENTS) ×5 IMPLANT
PACK CYSTO (CUSTOM PROCEDURE TRAY) ×5 IMPLANT
SCRUB PCMX 4 OZ (MISCELLANEOUS) IMPLANT
STENT POLARIS 5FRX26 (STENTS) ×5 IMPLANT
SYRINGE IRR TOOMEY STRL 70CC (SYRINGE) IMPLANT
TUBING CONNECTING 10 (TUBING) ×4 IMPLANT
TUBING CONNECTING 10' (TUBING) ×1

## 2013-10-03 NOTE — Anesthesia Preprocedure Evaluation (Addendum)
Anesthesia Evaluation  Patient identified by MRN, date of birth, ID band Patient awake    Reviewed: Allergy & Precautions, H&P , NPO status , Patient's Chart, lab work & pertinent test results  Airway Mallampati: II TM Distance: >3 FB Neck ROM: Full    Dental  (+) Teeth Intact, Dental Advisory Given, Missing, Partial Upper,    Pulmonary neg pulmonary ROS, former smoker,  breath sounds clear to auscultation  Pulmonary exam normal       Cardiovascular hypertension, + angina + CAD, + Past MI and +CHF negative cardio ROS  + Cardiac Defibrillator Rhythm:Regular Rate:Normal     Neuro/Psych negative neurological ROS  negative psych ROS   GI/Hepatic negative GI ROS, Neg liver ROS,   Endo/Other  negative endocrine ROS  Renal/GU negative Renal ROS  negative genitourinary   Musculoskeletal negative musculoskeletal ROS (+)   Abdominal   Peds negative pediatric ROS (+)  Hematology negative hematology ROS (+)   Anesthesia Other Findings   Reproductive/Obstetrics                          Anesthesia Physical Anesthesia Plan  ASA: III  Anesthesia Plan: General   Post-op Pain Management:    Induction: Intravenous  Airway Management Planned: LMA and Oral ETT  Additional Equipment:   Intra-op Plan:   Post-operative Plan: Extubation in OR  Informed Consent: I have reviewed the patients History and Physical, chart, labs and discussed the procedure including the risks, benefits and alternatives for the proposed anesthesia with the patient or authorized representative who has indicated his/her understanding and acceptance.   Dental advisory given  Plan Discussed with: CRNA  Anesthesia Plan Comments:         Anesthesia Quick Evaluation

## 2013-10-03 NOTE — Anesthesia Postprocedure Evaluation (Signed)
Anesthesia Post Note  Patient: [name] [name]  Procedure(s) Performed: Procedure(s) (LRB): TRANSURETHRAL RESECTION OF BLADDER TUMOR WITH GYRUS (TURBT-GYRUS) (N/A) CYSTOSCOPY WITH RETROGRADE PYELOGRAM/URETERAL STENT PLACEMENT (Left)  Anesthesia type: General  Patient location: PACU  Post pain: Pain level controlled  Post assessment: Post-op Vital signs reviewed  Last Vitals:  Filed Vitals:   10/03/13 1240  BP: 112/75  Pulse: 51  Temp: 36.6 C  Resp: 14    Post vital signs: Reviewed  Level of consciousness: sedated  Complications: No apparent anesthesia complications

## 2013-10-03 NOTE — Transfer of Care (Signed)
Immediate Anesthesia Transfer of Care Note  Patient: [name] [name]  Procedure(s) Performed: Procedure(s): TRANSURETHRAL RESECTION OF BLADDER TUMOR WITH GYRUS (TURBT-GYRUS) (N/A) CYSTOSCOPY WITH RETROGRADE PYELOGRAM (Bilateral)  Patient Location: PACU  Anesthesia Type:General  Level of Consciousness: awake, alert  and oriented  Airway & Oxygen Therapy: Patient Spontanous Breathing and Patient connected to face mask oxygen  Post-op Assessment: Report given to PACU RN and Post -op Vital signs reviewed and stable  Post vital signs: Reviewed and stable  Complications: No apparent anesthesia complications

## 2013-10-03 NOTE — H&P (Signed)
Urology History and Physical Exam  CC: Bladder tumor.  HPI: 29ear old male presents for a bladder tumor. This was discovered during workup for gross hematuria. Office cystoscopy revealed a medium-size bladder tumor larger than 2 cm and less than 5 cm located on the left lateral bladder wall. This is concerning for bladder cancer. He has not had any recent gross hematuria. He is been cleared by his cardiologist, Dr. [name], to proceed with surgery. He has held his aspirin and plavix for this surgery. He presents today for cystoscopy, transurethral resection of bladder tumor, and bilateral retrograde pyelograms. We discussed risk and benefits, alternatives, and likelihood of achieving goals.   PMH: Past Medical History  Diagnosis Date  . HYPERLIPIDEMIA   . HYPERTENSION   . CAD   . CARDIOMYOPATHY, ISCHEMIC   . CHF   . Myocardial infarction   . Anginal pain   . Left ventricular dysfunction   . ICD (implantable cardiac defibrillator) in place 09/16/2011    PSH: Past Surgical History  Procedure Laterality Date  . Pt had ear surgery    . Icd  09/16/2011  . Hernia repair  2010    right inguinal    Allergies: Allergies  Allergen Reactions  . Plavix [Clopidogrel] Itching and Rash    Medications: Prescriptions prior to admission  Medication Sig Dispense Refill  . carvedilol (COREG) 6.25 MG tablet Take 6.25 mg by mouth 2 (two) times daily with a meal.      . ramipril (ALTACE) 5 MG capsule Take 5 mg by mouth 2 (two) times daily.      . rosuvastatin (CRESTOR) 20 MG tablet Take 20 mg by mouth daily.      Marland Kitchen aspirin 325 MG tablet Take 325 mg by mouth daily.      Marland Kitchen CIALIS 20 MG tablet Take 20 mg by mouth as needed. As needed for erectile dysfunction.         Social History: History   Social History  . Marital Status: Married    Spouse Name: N/A    Number of Children: N/A  . Years of Education: N/A   Occupational History  . Not on file.   Social History Main Topics  .  Smoking status: Former Smoker    Types: Cigarettes    Quit date: 09/16/1971  . Smokeless tobacco: Never Used  . Alcohol Use: No  . Drug Use: No  . Sexual Activity: Yes   Other Topics Concern  . Not on file   Social History Narrative  . No narrative on file    Family History: Family History  Problem Relation Age of Onset  . Heart failure      Review of Systems: Positive: None. Negative: Chest pain, SOB, or fever.  A further 10 point review of systems was negative except what is listed in the HPI.  Physical Exam: Filed Vitals:   10/03/13 0658  BP: 118/84  Pulse: 62  Temp: 97.7 F (36.5 C)  Resp: 18    General: No acute distress.  Awake. Head:  Normocephalic.  Atraumatic. ENT:  EOMI.  Mucous membranes moist Neck:  Supple.  No lymphadenopathy. CV:  S1 present. S2 present. Regular rate. Pulmonary: Equal effort bilaterally.  Clear to auscultation bilaterally. Abdomen: Soft.  Non- tender to palpation. Skin:  Normal turgor.  No visible rash. Extremity: No gross deformity of bilateral upper extremities.  No gross deformity of    bilateral lower extremities. Neurologic: Alert. Appropriate mood.    Studies:  Recent  Labs     10/02/13  1327  HGB  12.7*  WBC  4.6  PLT  181    Recent Labs     10/02/13  1327  NA  139  K  3.7  CL  103  CO2  24  BUN  18  CREATININE  0.97  CALCIUM  9.2  GFRNONAA  88*  GFRAA  >90     No results found for this basename: PT, INR, APTT,  in the last 72 hours   No components found with this basename: ABG,     Assessment:  Bladder tumor.  Plan: To OR  for cystoscopy, transurethral resection of bladder tumor, and bilateral retrograde pyelograms.

## 2013-10-03 NOTE — Discharge Instructions (Signed)
DISCHARGE INSTRUCTIONS FOR TRANSURETHRAL SURGERY OF BLADDER  MEDICATIONS:   1. DO NOT RESUME YOUR ANY BLOOD THINNERS FOR ONE WEEK.  2. Resume all your other meds from home.   ACTIVITY 1. No heavy lifting >10 pounds for 2 weeks 2. No sexual activity for 2 weeks 3. No strenuous activity for 2 weeks 4. No driving while on narcotic pain medications 5. Drink plenty of water 6. Continue to walk at home - you can still get blood clots when you are at  home, so keep active, but don't over do it. 7. Your urine may have some blood in it - make sure you drink plenty of water,  call or come to the ER immediately if your catheter stops draining  FOLEY CATHETER  (If you go home with a catheter in place.) 1. Make sure your catheter is attached to your leg at all times - do not let  anything pull on it 2. If the urine is your tube starts looking dark red or if it stops draining,  call us immediately or come to the ER 3. Drink plenty of water, if you do notice your urine looking darking, sit down,  relax and drink lots of water 4. You will be given a leg bag as well as an overnight bag for your catheter -  MAKE SURE ATTACHED TO YOUR LEG AT ALL TIMES WITH TAPE OR LEG STRAP  BATHING 1. You can shower.  You may take a bath unless you have a Foley catheter in place.  SIGNS/SYMPTOMS TO CALL: 1. Please call us if you have a fever greater than 101.5, uncontrolled  nausea/vomiting, uncontrolled pain, dizziness, unable to urinate, chest pain, shortness of breath, leg swelling, leg pain, or any other concerns  or questions.  You can reach [place] at [phone].

## 2013-10-03 NOTE — Op Note (Signed)
Urology Operative Report  Date of Procedure: 10/03/13  Surgeon: Rolan Bucco, MD Assistant:  None  Preoperative Diagnosis: Bladder tumor. Postoperative Diagnosis: Bladder tumors. Left renal pelvic mass.  Procedure(s): Transurethral resection of medium bladder tumor (>2 cm, <5 cm). Transurethral resection of small bladder tumor (>0.5 cm, <2 cm). Bilateral retrograde pyelograms with interpretation. Left ureter stent placement (5 x 26 polaris without tether). Bladder biopsy of lesion less than 0.5 cm. Foley catheter placement.  Estimated blood loss: Minimal  Specimen: Bladder tumors sent to path.  Drains: 20 Fr coude catheter.  Complications: None  Findings: Medium bladder tumor on left lateral bladder wall which was sessile in appearance; larger than 2 cm but less than 5 cm. Small bladder tumor at the right bladder neck at approximately the 8:00 position; larger than 0.5 cm but less than 2 cm. Small bladder lesion posterior bladder wall less than 0.5 cm. Negative filling defects or hydronephrosis in the right collecting system and ureter. Negative hydronephrosis in the left collecting system. Positive left upper renal pelvis mass filling defect. Left ureter stenosis.  History of present illness: 58 year old male presents today for bladder tumors discovered during workup for hematuria.   Procedure in detail: After informed consent was obtained, the patient was taken to the operating room. They were placed in the supine position. SCDs were turned on and in place. IV antibiotics were infused, and general anesthesia was induced. A timeout was performed in which the correct patient, surgical site, and procedure were identified and agreed upon by the team.  The patient was placed in a dorsolithotomy position, making sure to pad all pertinent neurovascular pressure points. The genitals were prepped and draped in the usual sterile fashion.  A rigid cystoscope was advanced through  the urethra and into the bladder. The bladder was drained and then fully distended and evaluated in a systematic fashion with a 30 70 lens to visualize the entire surface of the bladder. There were noted to be several areas of bladder tumors with the largest being a medium-size tumor which was less than 5 cm but larger than 2 cm located on the left lateral bladder wall. This was sessile in nature. There was also tumor on the bladder floor medial to the left ureter orifice which was sessile in nature which was larger than 0.5 cm but less than 2 cm as well as a bladder neck lesion at the 8:00 position which was a small bladder tumor larger than 0.5 cm and smaller than 2 cm as well as a very small pedunculated mass on the posterior bladder wall which was less than 0.5 cm.  Right retrograde pyelogram was obtained when I cannulated the right ureter orifice with a 5 French ureter catheter. I injected 10 cc of Omnipaque; this was negative for filling defects or hydronephrosis. This side drained well.  Left retrograde pyelogram was obtained when I cannulated the left ureter orifice with a 5 French ureter catheter. I injected 20 cc of Omnipaque. This was negative for hydronephrosis. There was a filling defect in the renal pelvis the upper portion of the pelvis which did not change with drainage of this side. This side drained well.  I then removed the cystoscope and attempted to place the 26 French visual obturator to the gyrus resectoscope, but the urethra meatus would not accommodate this due to mild stenosis. The urethra was then calibrated with sequential calibrations with Leander Rams sounds from 21 [place] up to 62 [place]. I was then able to pass the  resectoscope through the urethra and into the bladder. I resected in normal saline and used a bladder loop to resect the tumors in a systematic fashion. All these were resected and sent separately for pathology. I then used a cold cup biopsy forcep sample the base of  the largest bladder tumor on the left lateral bladder wall and this was sent as a deep biopsy.  Fulguration was then carried out all of the biopsy resection sites to maintain good hemostasis.  I then placed a sensor wire into the left ureter and up to the left renal pelvis. I attempted to place a dual-lumen ureter access sheath over this but this would not pass with ease and therefore I knew that a ureter scope would not pass over the wire either. I elected to leave a ureter stent to allow passive dilation.  The cystoscope was loaded over the working wire and a 5 x 26 Polaris stent without tether was placed over the wire through the cystoscope under fluoroscopy with ease. This was deployed in the left renal pelvis with the loops in the bladder.    The bladder was then drained, the cystoscope was removed, and I placed 10 cc of lidocaine into the urethra. I placed a 20 [place] coud-tip catheter and placed 10 cc of sterile water into the balloon. I then placed a belladonna and opium suppository to rectum.  He was then placed in a supine position, anesthesia was reversed, and he was taken to the [facility] in stable condition.  All counts were correct at the end of the case.  He will likely need a staged left ureteroscopy to evaluate the filling defect in his left renal pelvis. He will be given ciprofloxacin to take for 3 days now and to begin 1 day before his followup visit for possible catheter removal.

## 2013-10-04 ENCOUNTER — Encounter (HOSPITAL_COMMUNITY): Payer: Self-pay | Admitting: Urology

## 2013-10-12 ENCOUNTER — Other Ambulatory Visit: Payer: Self-pay | Admitting: Urology

## 2013-10-15 IMAGING — CR DG CHEST 2V
2 series · 2 of 2 positions shown · non-contrast
Comparison: 03/16/2010

CLINICAL DATA: Preop

CHEST - 2 VIEW

[view not recorded (1 of 2)]
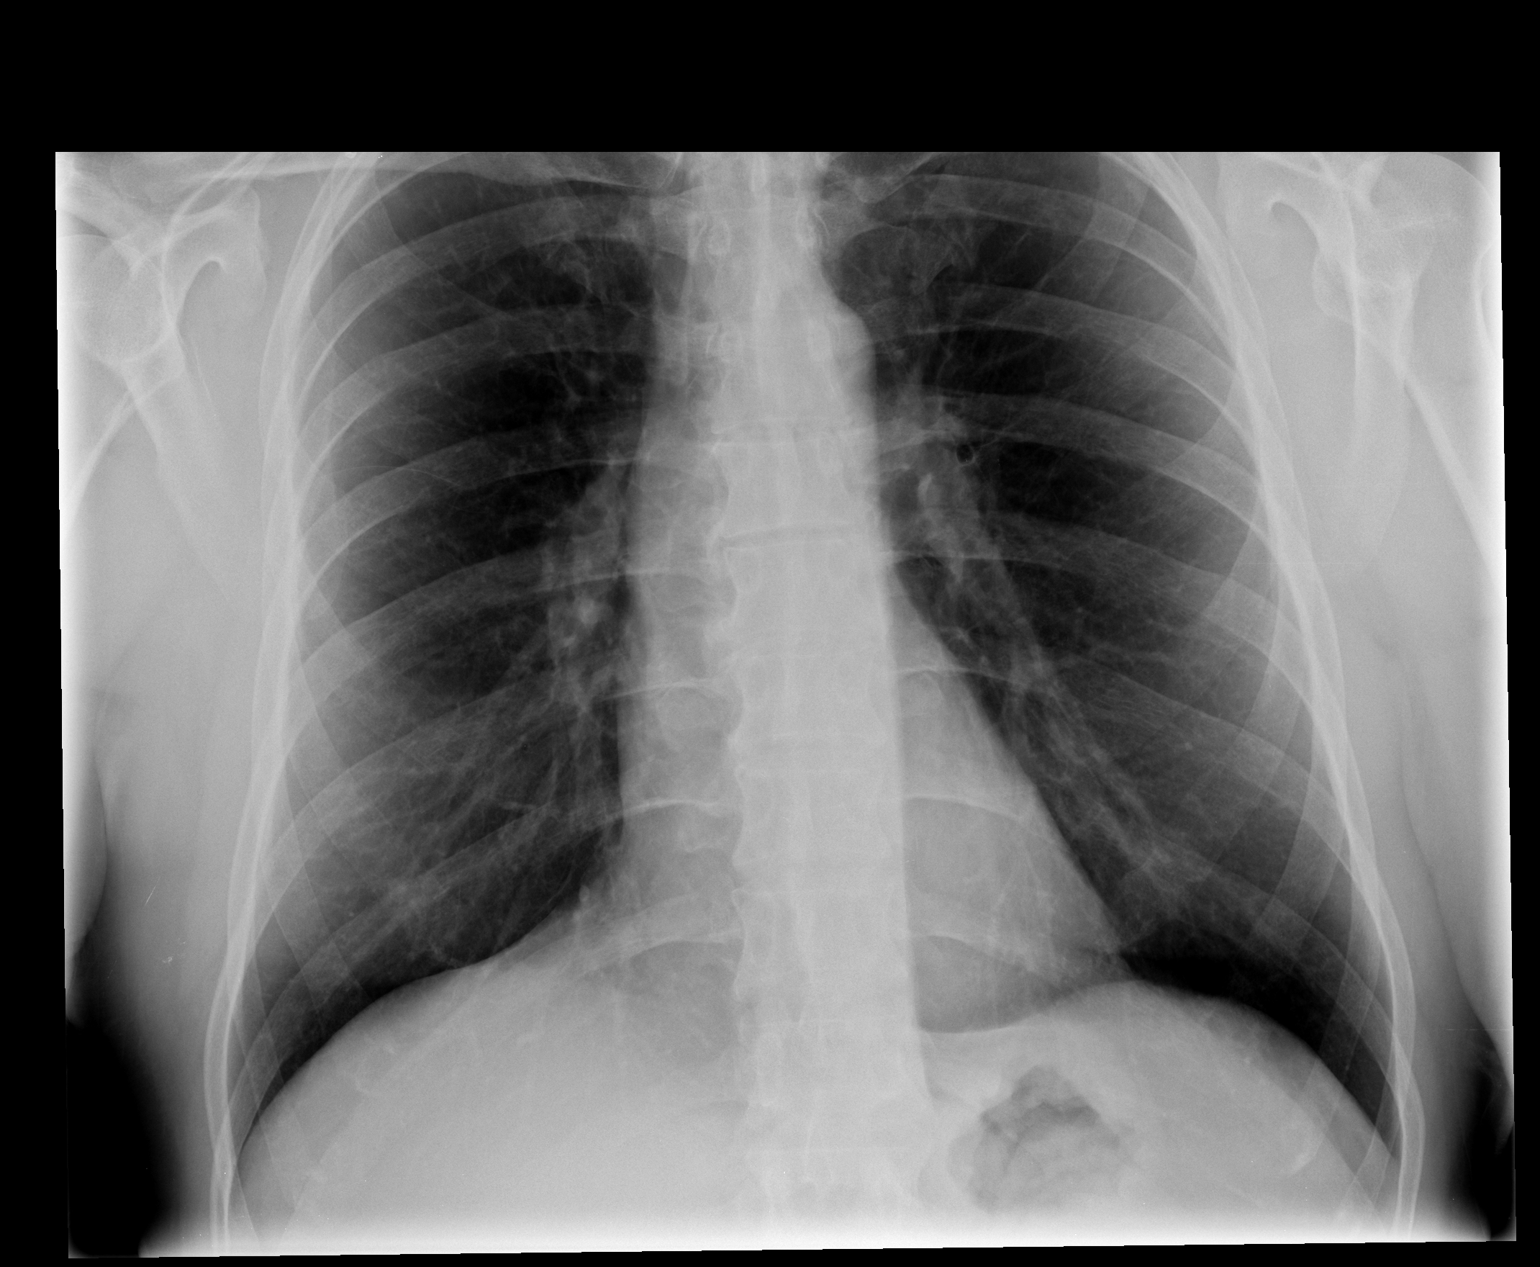

[view not recorded (2 of 2)]
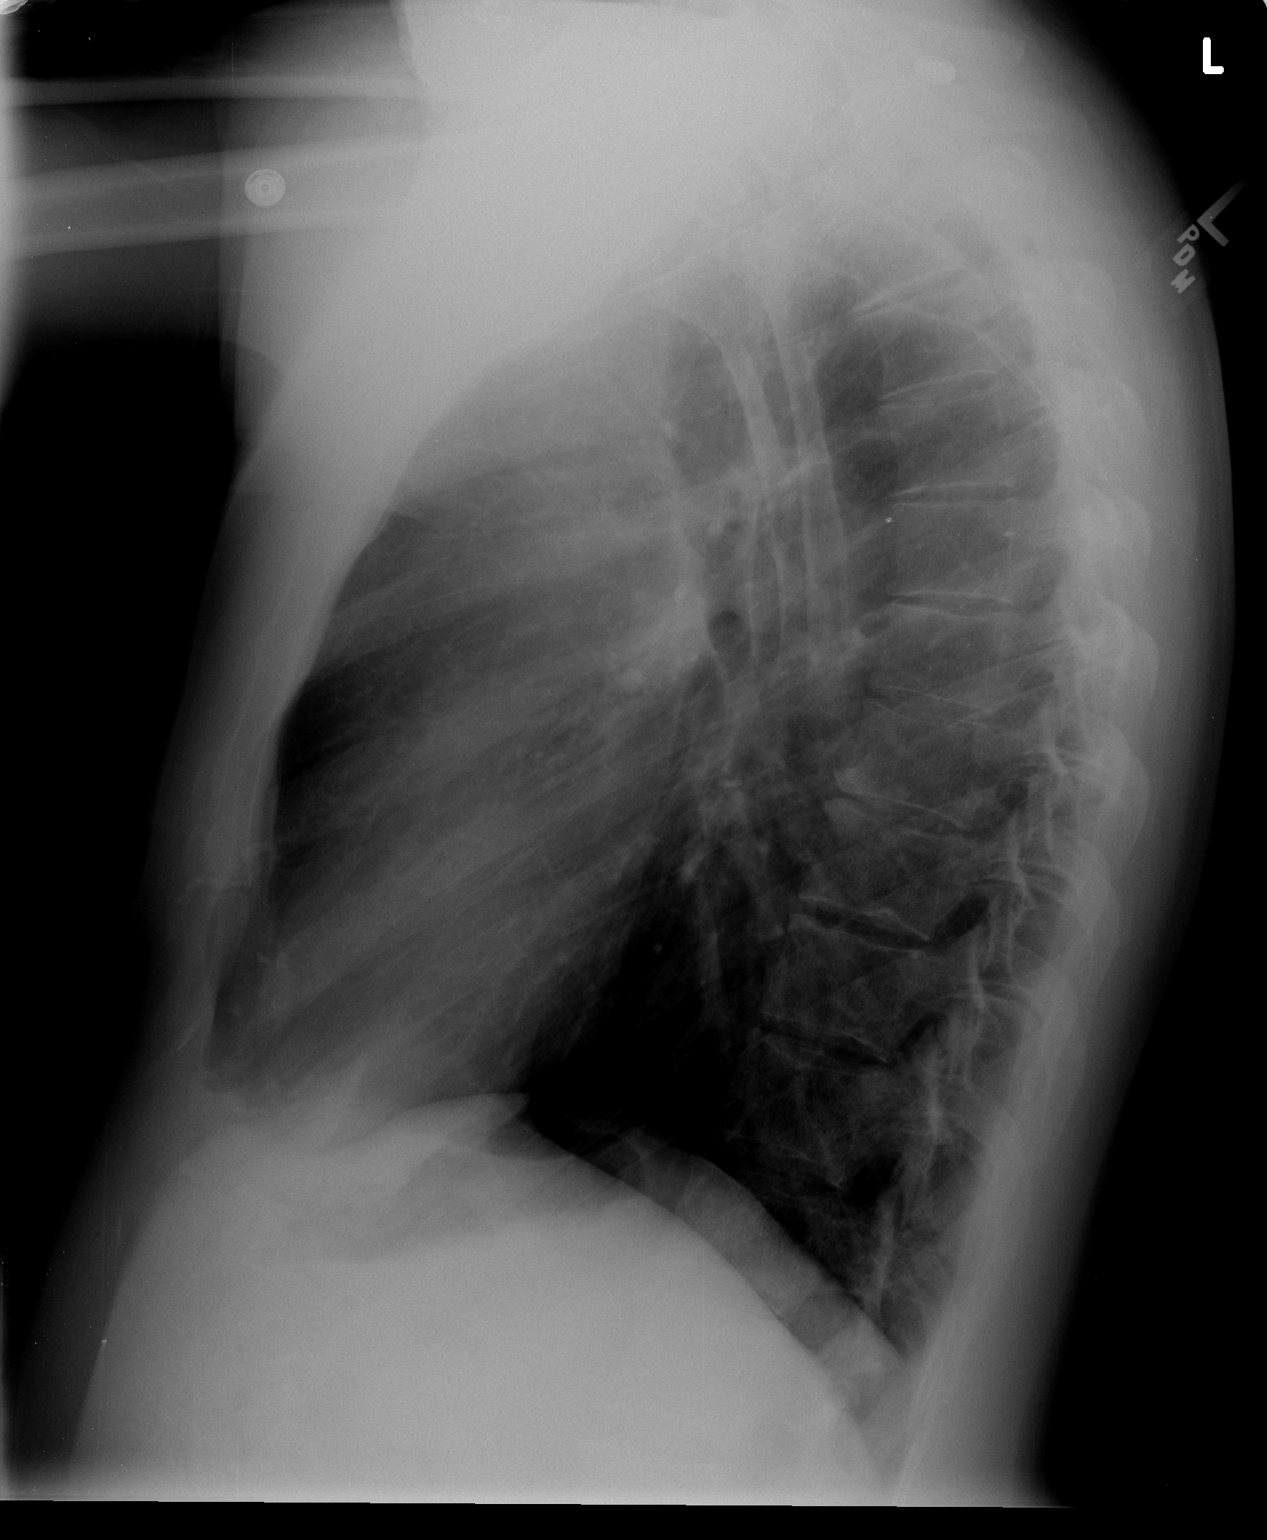

[2 of 2 positions shown; findings below may reference images not displayed]

FINDINGS: Heart is normal size.  Biapical scarring, stable.  Lungs
otherwise clear.  No effusions or acute bony abnormality.
IMPRESSION: No active cardiopulmonary disease.

## 2013-11-07 ENCOUNTER — Encounter: Payer: Self-pay | Admitting: Cardiology

## 2013-11-07 ENCOUNTER — Ambulatory Visit (INDEPENDENT_AMBULATORY_CARE_PROVIDER_SITE_OTHER): Payer: Managed Care, Other (non HMO) | Admitting: Cardiology

## 2013-11-07 VITALS — BP 122/70 | HR 61 | Ht 68.0 in | Wt 186.1 lb

## 2013-11-07 DIAGNOSIS — Z95810 Presence of automatic (implantable) cardiac defibrillator: Secondary | ICD-10-CM

## 2013-11-07 DIAGNOSIS — I2510 Atherosclerotic heart disease of native coronary artery without angina pectoris: Secondary | ICD-10-CM

## 2013-11-07 NOTE — Progress Notes (Signed)
HPI: FU coronary artery disease. He had an acute anterior infarct in 2003. At that time, he had PCI of his LAD and diagonal. Note, the Lcx and RCA had no disease. Carotid Dopplers in June of 2009 showed normal carotids. Echocardiogram in May of 2013 showed an ejection fraction of 30-35%. There was mild mitral regurgitation and trace aortic insufficiency. There was mild left atrial enlargement. Patient had ICD placed in June of 2013. Nuclear study June 2015 showed an ejection fraction of 33%. There was scar in the LAD territory with minimal peri-infarct ischemia. Since last seen, the patient has dyspnea with more extreme activities but not with routine activities. It is relieved with rest. It is not associated with chest pain. There is no orthopnea, PND or pedal edema. There is no syncope or palpitations. There is no exertional chest pain.    Current Outpatient Prescriptions  Medication Sig Dispense Refill  . carvedilol (COREG) 6.25 MG tablet Take 6.25 mg by mouth 2 (two) times daily with a meal.      . CIALIS 20 MG tablet Take 20 mg by mouth as needed. As needed for erectile dysfunction.      . hyoscyamine (LEVSIN, ANASPAZ) 0.125 MG tablet Take 1 tablet (0.125 mg total) by mouth every 4 (four) hours as needed (bladder spasms).  40 tablet  4  . oxybutynin (DITROPAN) 5 MG tablet Take 1 tablet (5 mg total) by mouth every 6 (six) hours as needed for bladder spasms.  40 tablet  4  . ramipril (ALTACE) 5 MG capsule Take 5 mg by mouth 2 (two) times daily.      . rosuvastatin (CRESTOR) 20 MG tablet Take 20 mg by mouth daily.      Marland Kitchen senna-docusate (SENOKOT S) 8.6-50 MG per tablet Take 1 tablet by mouth 2 (two) times daily.  60 tablet  0   No current facility-administered medications for this visit.     Past Medical History  Diagnosis Date  . HYPERLIPIDEMIA   . HYPERTENSION   . CAD   . CARDIOMYOPATHY, ISCHEMIC   . CHF   . Myocardial infarction   . Anginal pain   . Left ventricular  dysfunction   . 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 Pt had ear surgery    . Icd  09/16/2011  . Hernia repair  2010    right inguinal  . Transurethral resection of bladder tumor with gyrus (turbt-gyrus) N/A 10/03/2013    Procedure: TRANSURETHRAL RESECTION OF BLADDER TUMOR WITH GYRUS (TURBT-GYRUS);  Surgeon: Sharyn Creamer, MD;  Location: [facility];  Service: Urology;  Laterality: N/A;  . Cystoscopy w/ ureteral stent placement Left 10/03/2013    Procedure: CYSTOSCOPY WITH RETROGRADE PYELOGRAM/URETERAL STENT PLACEMENT;  Surgeon: Sharyn Creamer, MD;  Location: [facility];  Service: Urology;  Laterality: Left;    History   Social History  . Marital Status: Married    Spouse Name: N/A    Number of Children: N/A  . Years of Education: N/A   Occupational History  . Not on file.   Social History Main Topics  . Smoking status: Former Smoker    Types: Cigarettes    Quit date: 09/16/1971  . Smokeless tobacco: Never Used  . Alcohol Use: No  . Drug Use: No  . Sexual Activity: Yes   Other Topics Concern  . Not on file   Social History Narrative  . No  narrative on file    ROS: no fevers or chills, productive cough, hemoptysis, dysphasia, odynophagia, melena, hematochezia, dysuria, hematuria, rash, seizure activity, orthopnea, PND, pedal edema, claudication. Remaining systems are negative.  Physical Exam: Well-developed well-nourished in no acute distress.  Skin is warm and dry.  HEENT is normal.  Neck is supple.  Chest is clear to auscultation with normal expansion.  Cardiovascular exam is regular rate and rhythm.  Abdominal exam nontender or distended. No masses palpated. Extremities show no edema. neuro grossly intact  ECG Sinus rhythm at a rate of 61. Prior septal infarct.

## 2013-11-07 NOTE — Assessment & Plan Note (Signed)
Continue statin. Check lipids and liver. 

## 2013-11-07 NOTE — Assessment & Plan Note (Signed)
Continue aspirin and statin. He is scheduled for removal of bladder tumor. Recent nuclear study showed minimal ischemia. No symptoms. Okay to proceed with surgery.

## 2013-11-07 NOTE — Assessment & Plan Note (Signed)
Blood pressure controlled. Continue present medications. Check potassium and renal function. 

## 2013-11-07 NOTE — Assessment & Plan Note (Signed)
Continue ACE inhibitor and beta blocker. 

## 2013-11-07 NOTE — Assessment & Plan Note (Signed)
Followed by electrophysiology. 

## 2013-11-07 NOTE — Patient Instructions (Signed)
Your physician wants you to follow-up in: ONE YEAR WITH DR CRENSHAW You will receive a reminder letter in the mail two months in advance. If you don't receive a letter, please call our office to schedule the follow-up appointment.   Your physician recommends that you return for lab work WHEN FASTING 

## 2013-11-08 NOTE — Patient Instructions (Addendum)
[name] [name]  11/08/2013                           YOUR PROCEDURE IS SCHEDULED ON: 11/14/13               ENTER THRU [facility]TR[facility] AND                           FOLLOW  SIGNS TO SHORT STAY CENTER                 ARRIVE AT SHORT STAY AT:  6:30 AM               CALL THIS NUMBER IF ANY PROBLEMS THE DAY OF SURGERY :               [phone]                                REMEMBER:   Do not eat food or drink liquids AFTER MIDNIGHT                  Take these medicines the morning of surgery with               A SIPS OF WATER :   CARVEDILOL      Do not wear jewelry, make-up   Do not wear lotions, powders, or perfumes.   Do not shave legs or underarms 12 hrs. before surgery (men may shave face)  Do not bring valuables to the hospital.  Contacts, dentures or bridgework may not be worn into surgery.  Leave suitcase in the car. After surgery it may be brought to your room.  For patients admitted to the hospital more than one night, checkout time is            11:00 AM                                                       The day of discharge.   Patients discharged the day of surgery will not be allowed to drive home.            If going home same day of surgery, must have someone stay with you              FIRST 24 hrs at home and arrange for some one to drive you              home from hospital.   ________________________________________________________________________                                                                        [facility]  Before surgery, you can play an important role.  Because skin is not sterile, your skin needs to be as free of germs as possible.  You can reduce the number of germs on your skin by washing  with CHG (chlorahexidine gluconate) soap before surgery.  CHG is an antiseptic cleaner which kills germs and bonds with the skin to continue killing germs even after washing. Please DO  NOT use if you have an allergy to CHG or antibacterial soaps.  If your skin becomes reddened/irritated stop using the CHG and inform your nurse when you arrive at Short Stay. Do not shave (including legs and underarms) for at least 48 hours prior to the first CHG shower.  You may shave your face. Please follow these instructions carefully:   1.  Shower with CHG Soap the night before surgery and the  morning of Surgery.   2.  If you choose to wash your hair, wash your hair first as usual with your  normal  Shampoo.   3.  After you shampoo, rinse your hair and body thoroughly to remove the  shampoo.                                         4.  Use CHG as you would any other liquid soap.  You can apply chg directly  to the skin and wash . Gently wash with scrungie or clean wascloth    5.  Apply the CHG Soap to your body ONLY FROM THE NECK DOWN.   Do not use on open                           Wound or open sores. Avoid contact with eyes, ears mouth and genitals (private parts).                        Genitals (private parts) with your normal soap.              6.  Wash thoroughly, paying special attention to the area where your surgery  will be performed.   7.  Thoroughly rinse your body with warm water from the neck down.   8.  DO NOT shower/wash with your normal soap after using and rinsing off  the CHG Soap .                9.  Pat yourself dry with a clean towel.             10.  Wear clean pajamas.             11.  Place clean sheets on your bed the night of your first shower and do not  sleep with pets.  Day of Surgery : Do not apply any lotions/deodorants the morning of surgery.  Please wear clean clothes to the hospital/surgery center.  FAILURE TO FOLLOW THESE INSTRUCTIONS MAY RESULT IN THE CANCELLATION OF YOUR SURGERY    PATIENT SIGNATURE_________________________________  ______________________________________________________________________

## 2013-11-09 ENCOUNTER — Encounter (HOSPITAL_COMMUNITY): Payer: Self-pay | Admitting: Pharmacy Technician

## 2013-11-09 ENCOUNTER — Encounter (HOSPITAL_COMMUNITY)
Admission: RE | Admit: 2013-11-09 | Discharge: 2013-11-09 | Disposition: A | Discharge status: Home / Self Care | Payer: Managed Care, Other (non HMO) | Source: Ambulatory Visit | Attending: Urology | Admitting: Urology

## 2013-11-09 ENCOUNTER — Encounter (HOSPITAL_COMMUNITY): Payer: Self-pay

## 2013-11-09 DIAGNOSIS — Z01812 Encounter for preprocedural laboratory examination: Secondary | ICD-10-CM | POA: Insufficient documentation

## 2013-11-09 HISTORY — DX: Malignant neoplasm of bladder, unspecified: C67.9

## 2013-11-09 LAB — BASIC METABOLIC PANEL
Anion gap: 9 (ref 5–15)
BUN: 10 mg/dL (ref 6–23)
CO2: 25 mEq/L (ref 19–32)
CREATININE: 0.88 mg/dL (ref 0.50–1.35)
Calcium: 9 mg/dL (ref 8.4–10.5)
Chloride: 104 mEq/L (ref 96–112)
Glucose, Bld: 94 mg/dL (ref 70–99)
Potassium: 3.8 mEq/L (ref 3.7–5.3)
Sodium: 138 mEq/L (ref 137–147)

## 2013-11-09 LAB — CBC
HEMATOCRIT: 36.4 % — AB (ref 39.0–52.0)
Hemoglobin: 12.3 g/dL — ABNORMAL LOW (ref 13.0–17.0)
MCH: 29.3 pg (ref 26.0–34.0)
MCHC: 33.8 g/dL (ref 30.0–36.0)
MCV: 86.7 fL (ref 78.0–100.0)
PLATELETS: 176 10*3/uL (ref 150–400)
RBC: 4.2 MIL/uL — ABNORMAL LOW (ref 4.22–5.81)
RDW: 12.6 % (ref 11.5–15.5)
WBC: 4.1 10*3/uL (ref 4.0–10.5)

## 2013-11-09 NOTE — Progress Notes (Signed)
11/09/13 1204  [name] [name]  Have you ever been diagnosed with sleep apnea through a sleep study? No  Do you snore loudly (loud enough to be heard through closed doors)?  0  Do you often feel tired, fatigued, or sleepy during the daytime? 0  Has anyone observed you stop breathing during your sleep? 0  Do you have, or are you being treated for high blood pressure? 1  BMI more than 35 kg/m2? 0  Age over 60 years old? 1  Neck circumference greater than 40 cm/16 inches? 1  Gender: 1  Obstructive Sleep Apnea Score 4  Score 4 or greater  Results sent to PCP

## 2013-11-09 NOTE — Progress Notes (Signed)
I spoke with [name] [name] - he said a magnet could be used if cautery was going to be used .  Call him if any questions.

## 2013-11-13 ENCOUNTER — Other Ambulatory Visit: Payer: Self-pay | Admitting: Cardiology

## 2013-11-13 MED ORDER — GENTAMICIN SULFATE 40 MG/ML IJ SOLN
380.0000 mg | INTRAVENOUS | Status: AC
  Administered 2013-11-14: 380 mg via INTRAVENOUS
  Filled 2013-11-13: qty 9.5

## 2013-11-14 ENCOUNTER — Encounter (HOSPITAL_COMMUNITY)
Admission: RE | Disposition: A | Discharge status: Home / Self Care | Payer: Self-pay | Source: Ambulatory Visit | Attending: Urology

## 2013-11-14 ENCOUNTER — Encounter (HOSPITAL_COMMUNITY): Payer: Self-pay

## 2013-11-14 ENCOUNTER — Encounter (HOSPITAL_COMMUNITY): Payer: Managed Care, Other (non HMO) | Admitting: Anesthesiology

## 2013-11-14 ENCOUNTER — Ambulatory Visit (HOSPITAL_COMMUNITY): Payer: Managed Care, Other (non HMO) | Admitting: Anesthesiology

## 2013-11-14 ENCOUNTER — Ambulatory Visit (HOSPITAL_COMMUNITY)
Admission: RE | Admit: 2013-11-14 | Discharge: 2013-11-14 | Disposition: A | Discharge status: Home / Self Care | Payer: Managed Care, Other (non HMO) | Source: Ambulatory Visit | Attending: Urology | Admitting: Urology

## 2013-11-14 DIAGNOSIS — Z466 Encounter for fitting and adjustment of urinary device: Secondary | ICD-10-CM | POA: Diagnosis not present

## 2013-11-14 DIAGNOSIS — C679 Malignant neoplasm of bladder, unspecified: Secondary | ICD-10-CM | POA: Insufficient documentation

## 2013-11-14 DIAGNOSIS — Z95810 Presence of automatic (implantable) cardiac defibrillator: Secondary | ICD-10-CM | POA: Diagnosis not present

## 2013-11-14 DIAGNOSIS — I10 Essential (primary) hypertension: Secondary | ICD-10-CM | POA: Diagnosis not present

## 2013-11-14 DIAGNOSIS — I252 Old myocardial infarction: Secondary | ICD-10-CM | POA: Diagnosis not present

## 2013-11-14 DIAGNOSIS — N2889 Other specified disorders of kidney and ureter: Secondary | ICD-10-CM | POA: Diagnosis not present

## 2013-11-14 DIAGNOSIS — I2589 Other forms of chronic ischemic heart disease: Secondary | ICD-10-CM | POA: Insufficient documentation

## 2013-11-14 DIAGNOSIS — I509 Heart failure, unspecified: Secondary | ICD-10-CM | POA: Insufficient documentation

## 2013-11-14 DIAGNOSIS — Z888 Allergy status to other drugs, medicaments and biological substances status: Secondary | ICD-10-CM | POA: Diagnosis not present

## 2013-11-14 DIAGNOSIS — E785 Hyperlipidemia, unspecified: Secondary | ICD-10-CM | POA: Diagnosis not present

## 2013-11-14 HISTORY — PX: CYSTOSCOPY WITH RETROGRADE PYELOGRAM, URETEROSCOPY AND STENT PLACEMENT: SHX5789

## 2013-11-14 HISTORY — PX: TRANSURETHRAL RESECTION OF BLADDER TUMOR WITH GYRUS (TURBT-GYRUS): SHX6458

## 2013-11-14 SURGERY — TRANSURETHRAL RESECTION OF BLADDER TUMOR WITH GYRUS (TURBT-GYRUS)
Anesthesia: General

## 2013-11-14 MED ORDER — LACTATED RINGERS IV SOLN: INTRAVENOUS | Status: DC

## 2013-11-14 MED ORDER — FENTANYL CITRATE 0.05 MG/ML IJ SOLN
INTRAMUSCULAR | Status: DC | PRN
  Administered 2013-11-14 (×3): 50 ug via INTRAVENOUS
  Administered 2013-11-14 (×2): 25 ug via INTRAVENOUS

## 2013-11-14 MED ORDER — MIDAZOLAM HCL 5 MG/5ML IJ SOLN
INTRAMUSCULAR | Status: DC | PRN
  Administered 2013-11-14: 2 mg via INTRAVENOUS

## 2013-11-14 MED ORDER — LACTATED RINGERS IV SOLN
INTRAVENOUS | Status: DC | PRN
  Administered 2013-11-14: 08:00:00 via INTRAVENOUS

## 2013-11-14 MED ORDER — SUCCINYLCHOLINE CHLORIDE 20 MG/ML IJ SOLN
INTRAMUSCULAR | Status: DC | PRN
  Administered 2013-11-14: 100 mg via INTRAVENOUS

## 2013-11-14 MED ORDER — MIDAZOLAM HCL 2 MG/2ML IJ SOLN
INTRAMUSCULAR | Status: AC
  Filled 2013-11-14: qty 2

## 2013-11-14 MED ORDER — FENTANYL CITRATE 0.05 MG/ML IJ SOLN
INTRAMUSCULAR | Status: AC
  Filled 2013-11-14: qty 2

## 2013-11-14 MED ORDER — ONDANSETRON HCL 4 MG/2ML IJ SOLN
INTRAMUSCULAR | Status: DC | PRN
  Administered 2013-11-14: 4 mg via INTRAVENOUS

## 2013-11-14 MED ORDER — LIDOCAINE HCL (CARDIAC) 20 MG/ML IV SOLN
INTRAVENOUS | Status: AC
  Filled 2013-11-14: qty 5

## 2013-11-14 MED ORDER — PROPOFOL 10 MG/ML IV BOLUS
INTRAVENOUS | Status: DC | PRN
Start: 2013-11-14 — End: 2013-11-14
  Administered 2013-11-14: 160 mg via INTRAVENOUS

## 2013-11-14 MED ORDER — FENTANYL CITRATE 0.05 MG/ML IJ SOLN: 25.0000 ug | INTRAMUSCULAR | Status: DC | PRN

## 2013-11-14 MED ORDER — OXYCODONE-ACETAMINOPHEN 5-325 MG PO TABS: 1.0000 | ORAL_TABLET | ORAL | Status: DC | PRN

## 2013-11-14 MED ORDER — LIDOCAINE HCL (CARDIAC) 20 MG/ML IV SOLN
INTRAVENOUS | Status: DC | PRN
  Administered 2013-11-14: 50 mg via INTRAVENOUS

## 2013-11-14 MED ORDER — OXYCODONE-ACETAMINOPHEN 5-325 MG PO TABS
1.0000 | ORAL_TABLET | ORAL | Status: DC | PRN
  Administered 2013-11-14: 1 via ORAL
  Filled 2013-11-14: qty 1

## 2013-11-14 MED ORDER — PROPOFOL 10 MG/ML IV BOLUS
INTRAVENOUS | Status: AC
  Filled 2013-11-14: qty 20

## 2013-11-14 MED ORDER — SENNOSIDES-DOCUSATE SODIUM 8.6-50 MG PO TABS: 1.0000 | ORAL_TABLET | Freq: Two times a day (BID) | ORAL | Status: DC

## 2013-11-14 MED ORDER — ONDANSETRON HCL 4 MG/2ML IJ SOLN
INTRAMUSCULAR | Status: AC
  Filled 2013-11-14: qty 2

## 2013-11-14 MED ORDER — SODIUM CHLORIDE 0.9 % IR SOLN
Status: DC | PRN
  Administered 2013-11-14: 8000 mL via INTRAVESICAL

## 2013-11-14 SURGICAL SUPPLY — 39 items
BAG URINE DRAINAGE (UROLOGICAL SUPPLIES) ×4 IMPLANT
BAG URO CATCHER STRL LF (DRAPE) ×4 IMPLANT
BASKET LASER NITINOL 1.9FR (BASKET) IMPLANT
BASKET STNLS GEMINI 4WIRE 3FR (BASKET) IMPLANT
BASKET ZERO TIP NITINOL 2.4FR (BASKET) IMPLANT
BRUSH URET BIOPSY 3F (UROLOGICAL SUPPLIES) ×4 IMPLANT
BSKT STON RTRVL 120 1.9FR (BASKET)
BSKT STON RTRVL GEM 120X11 3FR (BASKET)
BSKT STON RTRVL ZERO TP 2.4FR (BASKET)
CATH FOLEY 3WAY 30CC 22FR (CATHETERS) ×4 IMPLANT
CATH INTERMIT  6FR 70CM (CATHETERS) ×4 IMPLANT
CLOTH BEACON ORANGE TIMEOUT ST (SAFETY) ×4 IMPLANT
DRAPE CAMERA CLOSED 9X96 (DRAPES) ×4 IMPLANT
ELECT BUTTON HF 24-28F 2 30DE (ELECTRODE) IMPLANT
ELECT LOOP MED HF 24F 12D (CUTTING LOOP) IMPLANT
ELECT LOOP MED HF 24F 12D CBL (CLIP) IMPLANT
ELECT REM PT RETURN 9FT ADLT (ELECTROSURGICAL) ×4
ELECT RESECT VAPORIZE 12D CBL (ELECTRODE) IMPLANT
ELECTRODE REM PT RTRN 9FT ADLT (ELECTROSURGICAL) ×2 IMPLANT
FIBER LASER FLEXIVA 200 (UROLOGICAL SUPPLIES) IMPLANT
FIBER LASER FLEXIVA 365 (UROLOGICAL SUPPLIES) IMPLANT
GLOVE BIOGEL M STRL SZ7.5 (GLOVE) ×4 IMPLANT
GOWN STRL REUS W/TWL LRG LVL3 (GOWN DISPOSABLE) ×4 IMPLANT
GOWN STRL REUS W/TWL XL LVL3 (GOWN DISPOSABLE) ×4 IMPLANT
GUIDEWIRE ANG ZIPWIRE 038X150 (WIRE) ×4 IMPLANT
GUIDEWIRE STR DUAL SENSOR (WIRE) ×4 IMPLANT
HOLDER FOLEY CATH W/STRAP (MISCELLANEOUS) ×4 IMPLANT
IV NS IRRIG 3000ML ARTHROMATIC (IV SOLUTION) ×8 IMPLANT
MANIFOLD NEPTUNE II (INSTRUMENTS) ×4 IMPLANT
PACK CYSTO (CUSTOM PROCEDURE TRAY) ×4 IMPLANT
PLUG CATH AND CAP STER (CATHETERS) IMPLANT
SHEATH ACCESS URETERAL 54CM (SHEATH) ×8 IMPLANT
STENT POLARIS 5FRX26 (STENTS) ×4 IMPLANT
SYR 30ML LL (SYRINGE) IMPLANT
SYRINGE 12CC LL (MISCELLANEOUS) IMPLANT
SYRINGE IRR TOOMEY STRL 70CC (SYRINGE) IMPLANT
TUBE FEEDING 8FR 16IN STR KANG (MISCELLANEOUS) ×4 IMPLANT
TUBING CONNECTING 10 (TUBING) ×3 IMPLANT
TUBING CONNECTING 10' (TUBING) ×1

## 2013-11-14 NOTE — Anesthesia Preprocedure Evaluation (Addendum)
Anesthesia Evaluation  Patient identified by MRN, date of birth, ID band Patient awake    Reviewed: Allergy & Precautions, H&P , NPO status , Patient's Chart, lab work & pertinent test results  Airway Mallampati: II TM Distance: >3 FB Neck ROM: Full    Dental no notable dental hx. (+) Teeth Intact, Dental Advisory Given, Missing, Partial Upper,    Pulmonary neg pulmonary ROS, former smoker,  breath sounds clear to auscultation  Pulmonary exam normal       Cardiovascular Exercise Tolerance: Good hypertension, Pt. on medications + angina + CAD, + Past MI and +CHF negative cardio ROS  + Cardiac Defibrillator Rhythm:Regular Rate:Normal  Ischemic cardiomyopathy with LV dysfunction. MI 2003   Neuro/Psych negative neurological ROS  negative psych ROS   GI/Hepatic negative GI ROS, Neg liver ROS,   Endo/Other  negative endocrine ROS  Renal/GU negative Renal ROS  negative genitourinary   Musculoskeletal negative musculoskeletal ROS (+)   Abdominal   Peds negative pediatric ROS (+)  Hematology negative hematology ROS (+)   Anesthesia Other Findings   Reproductive/Obstetrics negative OB ROS                          Anesthesia Physical Anesthesia Plan  ASA: III  Anesthesia Plan: General   Post-op Pain Management:    Induction: Intravenous  Airway Management Planned: LMA  Additional Equipment:   Intra-op Plan:   Post-operative Plan:   Informed Consent: I have reviewed the patients History and Physical, chart, labs and discussed the procedure including the risks, benefits and alternatives for the proposed anesthesia with the patient or authorized representative who has indicated his/her understanding and acceptance.   Dental Advisory Given  Plan Discussed with: CRNA and Surgeon  Anesthesia Plan Comments:         Anesthesia Quick Evaluation

## 2013-11-14 NOTE — Brief Op Note (Signed)
11/14/2013  10:14 AM  PATIENT:  [name] [name]  58 y.o. male  PRE-OPERATIVE DIAGNOSIS:  LEFT RENAL PELVIC MASS , BLADDER CANCER  POST-OPERATIVE DIAGNOSIS:  LEFT RENAL PELVIC MASS , BLADDER CANCER  PROCEDURE:  Procedure(s): TRANSURETHRAL RESECTION OF BLADDER TUMOR WITH GYRUS (TURBT-GYRUS) (N/A) CYSTOSCOPY WITH BILATERAL RETROGRADE PYELOGRAM, LEFT URETEROSCOPY WITH BIOPSY  AND STENT EXCHANGE (Bilateral)  SURGEON:  Surgeon(s) and Role:    * Alexis Frock, MD - Primary  PHYSICIAN ASSISTANT:   ASSISTANTS: none   ANESTHESIA:   general  EBL:     BLOOD ADMINISTERED:none  DRAINS: none   LOCAL MEDICATIONS USED:  NONE  SPECIMEN:  Source of Specimen:  1 - Left renal pelvis mass biopsy, 2 - Left renal pelvis washing x2, 3 - bladder resection site and base  DISPOSITION OF SPECIMEN:  PATHOLOGY  COUNTS:  YES  TOURNIQUET:  * No tourniquets in log *  DICTATION: .Other Dictation: Dictation Number  [id number]  PLAN OF CARE: Discharge to home after PACU  PATIENT DISPOSITION:  PACU - hemodynamically stable.   Delay start of Pharmacological VTE agent (>24hrs) due to surgical blood loss or risk of bleeding: not applicable

## 2013-11-14 NOTE — Discharge Instructions (Signed)
1 - You may have urinary urgency (bladder spasms) and bloody urine on / off with stent in place. This is normal. ° °2 - Call MD or go to ER for fever >102, severe pain / nausea / vomiting not relieved by medications, or acute change in medical status ° °

## 2013-11-14 NOTE — Anesthesia Postprocedure Evaluation (Signed)
  Anesthesia Post-op Note  Patient: [name] [name]  Procedure(s) Performed: Procedure(s) (LRB): TRANSURETHRAL RESECTION OF BLADDER TUMOR WITH GYRUS (TURBT-GYRUS) (N/A) CYSTOSCOPY WITH BILATERAL RETROGRADE PYELOGRAM, LEFT URETEROSCOPY WITH BIOPSY  AND STENT EXCHANGE (Bilateral)  Patient Location: PACU  Anesthesia Type: General  Level of Consciousness: awake and alert   Airway and Oxygen Therapy: Patient Spontanous Breathing  Post-op Pain: mild  Post-op Assessment: Post-op Vital signs reviewed, Patient's Cardiovascular Status Stable, Respiratory Function Stable, Patent Airway and No signs of Nausea or vomiting  Last Vitals:  Filed Vitals:   11/14/13 1100  BP: 116/70  Pulse: 61  Temp: 36.3 C  Resp: 15    Post-op Vital Signs: stable   Complications: No apparent anesthesia complications

## 2013-11-14 NOTE — Transfer of Care (Signed)
Immediate Anesthesia Transfer of Care Note  Patient: [name] [name]  Procedure(s) Performed: Procedure(s): TRANSURETHRAL RESECTION OF BLADDER TUMOR WITH GYRUS (TURBT-GYRUS) (N/A) CYSTOSCOPY WITH BILATERAL RETROGRADE PYELOGRAM, LEFT URETEROSCOPY WITH BIOPSY  AND STENT EXCHANGE (Bilateral)  Patient Location: PACU  Anesthesia Type:General  Level of Consciousness: awake, alert  and oriented  Airway & Oxygen Therapy: Patient Spontanous Breathing and Patient connected to face mask oxygen  Post-op Assessment: Report given to PACU RN and Post -op Vital signs reviewed and stable  Post vital signs: Reviewed and stable  Complications: No apparent anesthesia complications

## 2013-11-14 NOTE — H&P (Signed)
[name] [name] is an 58 y.o. male.    Chief Complaint: Pre-OP Restaging TURBT, Left Ureteroscopy With iopsy  HPI:     1 - Gross Hematuria  - first episode gross hematuria 08/2013 prompting eval wtih CT and Cysto with multifocal bladder cancer and left renal pelvis filling defect / mass as per below.    2 - High Grade Bladder Cancer - Found on hematuira eval 2015. 08/2013 - T1G3 Multifocal by TURBT with muscle in specimen, Lt JJ stent placed for passive dilation.  3 - Left Renal Pelvis Filling Defect - left renal pelvis / lower polle filling defect on CT 08/2013 and operative retrograde. Ureteroscopy not possible 08/2013 due to ureteral caliber.  PMH sig for MI/Stent,ICD Follows Cards (, Dr. Kirk Ruths, now no daily limitations. )  Today [name] [name] is seen to proceed with restaging TURBT and left ureteroscopy. No interval fevers. Most recent UCX negative.   Past Medical History  Diagnosis Date  . HYPERLIPIDEMIA   . HYPERTENSION   . CAD   . CARDIOMYOPATHY, ISCHEMIC   . CHF   . Left ventricular dysfunction   . 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 Myocardial infarction 2003  . Anginal pain     NO RECENT ANGINA  . Bladder cancer     Past Surgical History  Procedure Laterality Date  . Pt had ear surgery    . Icd  09/16/2011  . Hernia repair  2010    right inguinal  . Transurethral resection of bladder tumor with gyrus (turbt-gyrus) N/A 10/03/2013    Procedure: TRANSURETHRAL RESECTION OF BLADDER TUMOR WITH GYRUS (TURBT-GYRUS);  Surgeon: Sharyn Creamer, MD;  Location: [facility];  Service: Urology;  Laterality: N/A;  . Cystoscopy w/ ureteral stent placement Left 10/03/2013    Procedure: CYSTOSCOPY WITH RETROGRADE PYELOGRAM/URETERAL STENT PLACEMENT;  Surgeon: Sharyn Creamer, MD;  Location: [facility];  Service: Urology;  Laterality: Left;    Family History  Problem Relation Age of Onset  . Heart failure     Social History:  reports that he quit smoking about 42  years ago. His smoking use included Cigarettes. He smoked 0.00 packs per day. He has never used smokeless tobacco. He reports that he does not drink alcohol or use illicit drugs.  Allergies:  Allergies  Allergen Reactions  . Plavix [Clopidogrel] Itching and Rash    No prescriptions prior to admission    No results found for this or any previous visit (from the past 48 hour(s)). No results found.  Review of Systems  Constitutional: Negative for fever and chills.  HENT: Negative.   Eyes: Negative.   Cardiovascular: Negative.   Gastrointestinal: Negative.   Musculoskeletal: Negative.   Skin: Negative.   Neurological: Negative.   Endo/Heme/Allergies: Negative.   Psychiatric/Behavioral: Negative.     There were no vitals taken for this visit. Physical Exam  Constitutional: He is oriented to person, place, and time. He appears well-developed.  HENT:  Head: Normocephalic.  Eyes: Pupils are equal, round, and reactive to light.  Neck: Normal range of motion. Neck supple.  Cardiovascular: Normal rate.   Respiratory: Effort normal.  GI: Soft. Bowel sounds are normal.  Musculoskeletal: Normal range of motion.  Neurological: He is alert and oriented to person, place, and time.  Skin: Skin is warm and dry.  Psychiatric: He has a normal mood and affect. His behavior is normal. Judgment and thought content normal.     Assessment/Plan    1 - Gross  Hematuria  - likely due to bladder cancer as per above.,   2 - High Grade Bladder Cancer - Needs restaging TURBT as planned today  We discussed operative biopsy / transurethral resection as the best next step for diagnostic and therapeutic purposes with goals being to remove all visible cancer and obtain tissue for pathologic exam. We discussed that for some low-grade tumors, this may be all the treatment required, but that for many other tumors such as high-grade lesions, further therapy including surgery and or chemotherapy may be  warranted. We also outlined the fact that any bladder cancer diagnosis will require close follow-up with periodic upper and lower tract evaluation. We discussed risks including bleeding, infection, damage to kidney / ureter / bladder including bladder perforation which can typically managed with prolonged foley catheterization. We mentioned anesthetic and other rare risks including DVT, PE, MI, and mortality. I also mentioned that adjunctive procedures such as ureteral stenting, retrograde pyelography, and ureteroscopy may be necessary to fully evaluate the urinary tract depending on intra-operative findings. After answering all questions to the patient's satisfaction, they wish to proceed.   Additional risks related to left ureteroscopy / biopsy including kidney injury, need for stents discussed.   3 - Left Renal Pelvis Filling Defect -Will re-attempt visualization as per above.  [name] [name] 11/14/2013, 6:12 AM

## 2013-11-15 ENCOUNTER — Encounter (HOSPITAL_COMMUNITY): Payer: Self-pay | Admitting: Urology

## 2013-11-15 NOTE — Op Note (Signed)
[name] [name], [name] [name]                ACCOUNT NO.:  [id number]  MEDICAL RECORD NO.:  [id number]  LOCATION:  [facility]                         FACILITY:  [facility]  PHYSICIAN:  Alexis Frock, MD     DATE OF BIRTH:  1955-12-06  DATE OF PROCEDURE: 11/14/2013 DATE OF DISCHARGE:                              OPERATIVE REPORT   DIAGNOSES:  Left renal pelvis mass, high-grade bladder cancer.  PROCEDURE: 1. Transurethral resection of bladder tumor, volume medium, restaging. 2. Bilateral retrograde pyelogram, interpretation. 3. Left ureteroscopy with ureteroscopic biopsy.  ESTIMATED BLOOD LOSS:  Nil.  COMPLICATIONS:  None.  SPECIMEN: 1. Left renal pelvis mass biopsy. 2. Left renal pelvis washing. 3. Old resection site of the bladder. 4. Base of old resection site of the bladder.  FINDINGS: 1. Apparent hidden calyx in the left medial lower pole, right     retrograde pyelogram, area in question did not feel worrisome for     mass completely involving calyx. 2. Upon right ureteroscopic vision, there was papillary and nodular     erythematous tissue and what appeared to be the opening of this     hidden calyx worrisome for malignancy in this location.  This area     was brushed, biopsied and the renal pelvis washed.  Intrarenal     anatomy did not enable grasping biopsy. 3. Otherwise, unremarkable bilateral retrograde pyelograms. 4. Old resection site with mounding erythematous tissue of the     borders.  No obvious recurrent tumor.  This did appear to horseshoe     around the area of left ureteral orifice which was uninjured post     Resection. Total resection volume 4cm2. 5. Left ureteral stent replaced, given proximity of     resection site to ureteral orifice.  INDICATION:  Mr. Jurgens is a 58 year old gentleman, who was found on workup of hematuria to have large volume high-grade bladder cancer as well as a questionable left lower pole renal pelvis mass by my colleague Dr. Jasmine December, who  has since relocated.  He underwent initial procedure approximately 6 weeks ago, where he had transurethral resection of bladder tumor which revealed T1 G3 disease.  An attempt at left ureteroscopy with biopsy, however, due to the narrow caliber of ureter that was not successful at this time.  He now presents for restaging procedure of his bladder and re-attempt at the left ureteroscopy. Informed consent was obtained and placed in medical record.  PROCEDURE IN DETAIL:  The patient being [name] [name], was verified. Procedure being left ureteroscopy with biopsy, restaging, transurethral resection of bladder tumor was confirmed.  Procedure was carried out. Time-out was performed.  Intravenous antibiotics were administered. General LMA anesthesia was introduced.  The patient was placed into a low lithotomy position.  Sterile field was created by prepping and draping the patient's penis, perineum, and proximal thighs using iodine x3.  Next, cystourethroscopy was performed using a 22-French rigid cystoscope with 12-degree offset lens.  Inspection of the anterior and posterior urethra unremarkable.  Inspection of the urinary bladder revealed distal end of the left ureteral stent in situ and old resection site in a horseshoe pattern around the left ureteral orifice extending mostly on  the left lateral wall.  There was no obvious gross recurrence of tumor.  There was some fibrinous tissue in the resection base consistent with healing.  There was some erythematous mounding of the edges consistent with healing.  Right ureteral orifice was unremarkable. There were no additional tumors in urinary bladder.  Next, attention was directed at the left ureteroscopy with biopsy.  The left distal stent was grasped and brought to the urethral meatus.  A 0.038 Glidewire was advanced at the level of the upper pole.  The stent was exchanged for an open- ended catheter and left retrograde pyelogram was  obtained.  Left retrograde pyelogram demonstrated a single left ureter, a single system, and kidney.  There were no obvious filling defects at this time. However, there was some question of a hidden calyx inferomedially.  The 0.038 Glidewire was once again advanced as a safety wire.  An 8-French feeding tube was placed in urinary bladder for pressure release.  Next, semi-rigid ureteroscopy was performed at the distal orifice of the ureter alongside a separate Sensor working wire.  No mucosal abnormalities were found.  A new 12/14, 54 cm ureteral access sheath was then placed at the level of the proximal ureter.  However, would not easily navigate to the UPJ as desired, likely due to some residual ureteral narrowing.  As such, the 6-French tip non digital ureteroscope was used to visualize the proximal ureter and renal pelvis and this did navigate this area successfully.  There were no obvious large papillary lesions within the upper pole and mid pole or lower lateral pole.  There was a papillary erythematous nodular area very close to the UPJ as an acute angle, likely corresponding to completely obliterated left lower medial calyx and is corresponding to recent CT scan.  This was certainly worrisome for carcinoma in this location.  Obliteration of this calyx due to the acute angulation and the inability to place an access sheath all the way to the renal pelvis, a direct grasping biopsy could not be obtained, therefore a brush biopsy was used to agitate the area of suspected tumor.  This was removed and set aside for cytology, labeled left renal pelvis mass biopsy.  Next, a washing was obtained of the left renal pelvis using saline and set aside as labeled left renal pelvis biopsy #1.  A escape-type basket was used to try to snare some of this questionable tumor material and acutely angled calyx.  However, I was unable to snare a significant tissue, but I did agitate this  tissue, therefore a second renal pelvis washing was obtained following these maneuver and set aside labeled the left renal pelvis washing #2.  There was no evidence of perforation.  The ureteroscope was removed as was the sheath under continuous fluoroscopic vision.  No mucosal abnormalities were found.  A new 5 x 26 Polaris stent was then placed using cystoscopic and fluoroscopic guidance.  Attention was directed to the restaging transurethral resection of bladder tumor using the gyrus resectoscope sheath and bipolar resectoscope loop.  The old resection site was carefully resected and appeared to be the fibromuscular stroma circumferentially in a horseshoe fashion around the left ureter taking great care to avoid any direct injury to ureteral orifice and this did not occur.  These fragments were set aside and labeled old resection site.  Additional cold cup biopsy forceps were taken of the deep margin and labelled base of old resection site and additional coagulation current was applied to these positions.  Final inspection of the urinary bladder revealed excellent hemostasis.  No evidence of perforation. There was no evidence of gross residual tumor.  Notably during the bladder resection, a bit of succinylcholine was given to avoid obturator reflux and this did not occur.  Given the favorable  findings, felt that urethral catheterization would not be warranted. The bladder was emptied per cystoscope.  Procedure was then terminated. The patient tolerated the procedure well.  There were no immediate periprocedural complications, and the patient was taken to postanesthesia care unit in stable condition.          ______________________________ Alexis Frock, MD     TM/MEDQ  D:  11/14/2013  T:  11/14/2013  Job:  [id number]

## 2013-11-16 ENCOUNTER — Encounter: Payer: Self-pay | Admitting: *Deleted

## 2013-11-16 LAB — LIPID PANEL
Cholesterol: 147 mg/dL (ref 0–200)
HDL: 41 mg/dL (ref >39)
LDL CALC: 71 mg/dL (ref 0–99)
Total CHOL/HDL Ratio: 3.6 Ratio
Triglycerides: 174 mg/dL — ABNORMAL HIGH (ref <150)
VLDL: 35 mg/dL (ref 0–40)

## 2013-11-16 LAB — HEPATIC FUNCTION PANEL
ALK PHOS: 62 U/L (ref 39–117)
ALT: 14 U/L (ref 0–53)
AST: 16 U/L (ref 0–37)
Albumin: 3.8 g/dL (ref 3.5–5.2)
BILIRUBIN TOTAL: 0.6 mg/dL (ref 0.2–1.2)
Bilirubin, Direct: 0.1 mg/dL (ref 0.0–0.3)
Indirect Bilirubin: 0.5 mg/dL (ref 0.2–1.2)
Total Protein: 6.5 g/dL (ref 6.0–8.3)

## 2013-11-16 LAB — BASIC METABOLIC PANEL WITH GFR
BUN: 8 mg/dL (ref 6–23)
CO2: 27 meq/L (ref 19–32)
CREATININE: 0.92 mg/dL (ref 0.50–1.35)
Calcium: 9 mg/dL (ref 8.4–10.5)
Chloride: 104 mEq/L (ref 96–112)
GFR, Est African American: >89 mL/min
GFR, Est Non African American: >89 mL/min
GLUCOSE: 95 mg/dL (ref 70–99)
Potassium: 3.9 mEq/L (ref 3.5–5.3)
Sodium: 138 mEq/L (ref 135–145)

## 2013-12-05 ENCOUNTER — Other Ambulatory Visit: Payer: Self-pay | Admitting: Cardiology

## 2014-01-03 ENCOUNTER — Encounter: Payer: Managed Care, Other (non HMO) | Admitting: *Deleted

## 2014-01-03 ENCOUNTER — Telehealth: Payer: Self-pay | Admitting: Cardiology

## 2014-01-03 NOTE — Telephone Encounter (Signed)
LMOVM reminding pt to send remote transmission.   

## 2014-01-04 ENCOUNTER — Encounter: Payer: Self-pay | Admitting: Cardiology

## 2014-01-13 ENCOUNTER — Encounter: Payer: Self-pay | Admitting: Internal Medicine

## 2014-01-13 DIAGNOSIS — I509 Heart failure, unspecified: Secondary | ICD-10-CM

## 2014-01-13 DIAGNOSIS — I429 Cardiomyopathy, unspecified: Secondary | ICD-10-CM

## 2014-01-14 ENCOUNTER — Telehealth: Payer: Self-pay | Admitting: Family Medicine

## 2014-01-14 ENCOUNTER — Ambulatory Visit (INDEPENDENT_AMBULATORY_CARE_PROVIDER_SITE_OTHER): Payer: Managed Care, Other (non HMO) | Admitting: *Deleted

## 2014-01-14 DIAGNOSIS — I429 Cardiomyopathy, unspecified: Secondary | ICD-10-CM

## 2014-01-14 DIAGNOSIS — I509 Heart failure, unspecified: Secondary | ICD-10-CM

## 2014-01-14 NOTE — Progress Notes (Signed)
Remote ICD transmission.   

## 2014-01-14 NOTE — Telephone Encounter (Signed)
Pt wanted appt this week. Will go to urgent care today

## 2014-01-15 LAB — MDC_IDC_ENUM_SESS_TYPE_REMOTE
Brady Statistic RV Percent Paced: 1 %
Date Time Interrogation Session: 20151018235625
HIGH POWER IMPEDANCE MEASURED VALUE: 75 Ohm
HighPow Impedance: 75 Ohm
Implantable Pulse Generator Serial Number: 1033528
Lead Channel Impedance Value: 460 Ohm
Lead Channel Pacing Threshold Amplitude: 1 V
Lead Channel Pacing Threshold Pulse Width: 0.5 ms
Lead Channel Setting Pacing Amplitude: 2.5 V
Lead Channel Setting Pacing Pulse Width: 0.5 ms
Lead Channel Setting Sensing Sensitivity: 0.5 mV
MDC IDC MSMT BATTERY REMAINING LONGEVITY: 79 mo
MDC IDC MSMT BATTERY REMAINING PERCENTAGE: 77 %
MDC IDC MSMT BATTERY VOLTAGE: 2.98 V
MDC IDC MSMT LEADCHNL RV SENSING INTR AMPL: 11.7 mV
MDC IDC SET ZONE DETECTION INTERVAL: 310 ms
Zone Setting Detection Interval: 270 ms

## 2014-01-29 ENCOUNTER — Other Ambulatory Visit: Payer: Self-pay | Admitting: Urology

## 2014-02-12 ENCOUNTER — Encounter: Payer: Self-pay | Admitting: Cardiology

## 2014-03-07 ENCOUNTER — Encounter (HOSPITAL_COMMUNITY): Payer: Self-pay | Admitting: Internal Medicine

## 2014-03-08 ENCOUNTER — Other Ambulatory Visit: Payer: Self-pay | Admitting: Internal Medicine

## 2014-03-08 ENCOUNTER — Other Ambulatory Visit: Payer: Self-pay | Admitting: Cardiology

## 2014-03-08 ENCOUNTER — Other Ambulatory Visit: Payer: Self-pay

## 2014-03-08 NOTE — Telephone Encounter (Signed)
Rx has been sent to the pharmacy electronically. ° °

## 2014-03-12 ENCOUNTER — Encounter (HOSPITAL_COMMUNITY)
Admission: RE | Admit: 2014-03-12 | Discharge: 2014-03-12 | Disposition: A | Discharge status: Home / Self Care | Payer: Managed Care, Other (non HMO) | Source: Ambulatory Visit | Attending: Urology | Admitting: Urology

## 2014-03-12 ENCOUNTER — Encounter (HOSPITAL_COMMUNITY): Payer: Self-pay

## 2014-03-12 HISTORY — DX: Chronic kidney disease, unspecified: N18.9

## 2014-03-12 HISTORY — DX: Presence of automatic (implantable) cardiac defibrillator: Z95.810

## 2014-03-12 LAB — BASIC METABOLIC PANEL
Anion gap: 12 (ref 5–15)
BUN: 14 mg/dL (ref 6–23)
CO2: 25 mEq/L (ref 19–32)
Calcium: 9.2 mg/dL (ref 8.4–10.5)
Chloride: 102 mEq/L (ref 96–112)
Creatinine, Ser: 0.93 mg/dL (ref 0.50–1.35)
GFR, EST NON AFRICAN AMERICAN: 89 mL/min — AB (ref >90)
Glucose, Bld: 118 mg/dL — ABNORMAL HIGH (ref 70–99)
POTASSIUM: 4.1 meq/L (ref 3.7–5.3)
Sodium: 139 mEq/L (ref 137–147)

## 2014-03-12 LAB — CBC
HCT: 40.6 % (ref 39.0–52.0)
Hemoglobin: 13.6 g/dL (ref 13.0–17.0)
MCH: 30.2 pg (ref 26.0–34.0)
MCHC: 33.5 g/dL (ref 30.0–36.0)
MCV: 90 fL (ref 78.0–100.0)
PLATELETS: 223 10*3/uL (ref 150–400)
RBC: 4.51 MIL/uL (ref 4.22–5.81)
RDW: 13 % (ref 11.5–15.5)
WBC: 5.2 10*3/uL (ref 4.0–10.5)

## 2014-03-12 NOTE — Progress Notes (Addendum)
EKG 11/07/2013 epic Surgical clearance per Dr [name] on chart 12/11/2013 Last device check 01/13/2014  CXR epic 10/02/2013 Stress test epic 09/26/2013 11/07/2013 LOV with Dr [name] epic  LOV with Dr Lovena Le 10/02/2013

## 2014-03-12 NOTE — Patient Instructions (Addendum)
[name] [name]  03/12/2014   Your procedure is scheduled on: Friday, March 15, 2014  Report to [facility]  Entrance and follow signs to               [facility] arrive at  10:00 AM.  Call this number if you have problems the morning of surgery [phone]   Remember:  Do not eat food or drink liquids :After Midnight.     Take these medicines the morning of surgery with A SIP OF WATER: Carvedilol                                You may not have any metal on your body including hair pins and              piercings  Do not wear jewelry,lotions, powders or cologne.             Men may shave face and neck.   Do not bring valuables to the hospital. [place].  Contacts, dentures or bridgework may not be worn into surgery.  Leave suitcase in the car. After surgery it may be brought to your room.   _____________________________________________________________________             [facility] - Preparing for Surgery Before surgery, you can play an important role.  Because skin is not sterile, your skin needs to be as free of germs as possible.  You can reduce the number of germs on your skin by washing with CHG (chlorahexidine gluconate) soap before surgery.  CHG is an antiseptic cleaner which kills germs and bonds with the skin to continue killing germs even after washing. Please DO NOT use if you have an allergy to CHG or antibacterial soaps.  If your skin becomes reddened/irritated stop using the CHG and inform your nurse when you arrive at Short Stay. Do not shave (including legs and underarms) for at least 48 hours prior to the first CHG shower.  You may shave your face/neck. Please follow these instructions carefully:  1.  Shower with CHG Soap the night before surgery and the  morning of Surgery.  2.  If you choose to wash your hair, wash your hair first as usual with your  normal  shampoo.  3.  After  you shampoo, rinse your hair and body thoroughly to remove the  shampoo.                           4.  Use CHG as you would any other liquid soap.  You can apply chg directly  to the skin and wash                       Gently with a scrungie or clean washcloth.  5.  Apply the CHG Soap to your body ONLY FROM THE NECK DOWN.   Do not use on face/ open                           Wound or open sores. Avoid contact with eyes, ears mouth and genitals (private parts).  Wash face,  Genitals (private parts) with your normal soap.             6.  Wash thoroughly, paying special attention to the area where your surgery  will be performed.  7.  Thoroughly rinse your body with warm water from the neck down.  8.  DO NOT shower/wash with your normal soap after using and rinsing off  the CHG Soap.                9.  Pat yourself dry with a clean towel.            10.  Wear clean pajamas.            11.  Place clean sheets on your bed the night of your first shower and do not  sleep with pets. Day of Surgery : Do not apply any lotions/deodorants the morning of surgery.  Please wear clean clothes to the hospital/surgery center.  FAILURE TO FOLLOW THESE INSTRUCTIONS MAY RESULT IN THE CANCELLATION OF YOUR SURGERY PATIENT SIGNATURE_________________________________  NURSE SIGNATURE__________________________________  ________________________________________________________________________

## 2014-03-12 NOTE — Progress Notes (Signed)
Your patient has screened at an elevated risk for Obstructive Sleep Apnea using the Stop-Bang Tool during a pre-surgical vist. A score of 4 or greater is an elevated risk. Score of 6.

## 2014-03-13 NOTE — Progress Notes (Signed)
Dr Delma Post aware of patient history and data on chart and in EPIC.   ICD orders on chart.  No further orders given.

## 2014-03-15 ENCOUNTER — Inpatient Hospital Stay (HOSPITAL_COMMUNITY): Payer: Managed Care, Other (non HMO)

## 2014-03-15 ENCOUNTER — Encounter (HOSPITAL_COMMUNITY)
Admission: RE | Disposition: A | Discharge status: Home / Self Care | Payer: Self-pay | Source: Ambulatory Visit | Attending: Urology

## 2014-03-15 ENCOUNTER — Inpatient Hospital Stay (HOSPITAL_COMMUNITY): Payer: Managed Care, Other (non HMO) | Admitting: Anesthesiology

## 2014-03-15 ENCOUNTER — Inpatient Hospital Stay (HOSPITAL_COMMUNITY)
Admission: RE | Admit: 2014-03-15 | Discharge: 2014-03-17 | DRG: 658 | Disposition: A | Discharge status: Home / Self Care | Payer: Managed Care, Other (non HMO) | Source: Ambulatory Visit | Attending: Urology | Admitting: Urology

## 2014-03-15 ENCOUNTER — Encounter (HOSPITAL_COMMUNITY): Payer: Self-pay | Admitting: *Deleted

## 2014-03-15 DIAGNOSIS — I509 Heart failure, unspecified: Secondary | ICD-10-CM | POA: Diagnosis present

## 2014-03-15 DIAGNOSIS — N189 Chronic kidney disease, unspecified: Secondary | ICD-10-CM | POA: Diagnosis present

## 2014-03-15 DIAGNOSIS — R310 Gross hematuria: Secondary | ICD-10-CM | POA: Diagnosis present

## 2014-03-15 DIAGNOSIS — Z87891 Personal history of nicotine dependence: Secondary | ICD-10-CM

## 2014-03-15 DIAGNOSIS — Z8249 Family history of ischemic heart disease and other diseases of the circulatory system: Secondary | ICD-10-CM | POA: Diagnosis not present

## 2014-03-15 DIAGNOSIS — Z95810 Presence of automatic (implantable) cardiac defibrillator: Secondary | ICD-10-CM | POA: Diagnosis not present

## 2014-03-15 DIAGNOSIS — Z8551 Personal history of malignant neoplasm of bladder: Secondary | ICD-10-CM | POA: Diagnosis not present

## 2014-03-15 DIAGNOSIS — I129 Hypertensive chronic kidney disease with stage 1 through stage 4 chronic kidney disease, or unspecified chronic kidney disease: Secondary | ICD-10-CM | POA: Diagnosis present

## 2014-03-15 DIAGNOSIS — C652 Malignant neoplasm of left renal pelvis: Principal | ICD-10-CM | POA: Diagnosis present

## 2014-03-15 DIAGNOSIS — N2889 Other specified disorders of kidney and ureter: Secondary | ICD-10-CM

## 2014-03-15 DIAGNOSIS — I2510 Atherosclerotic heart disease of native coronary artery without angina pectoris: Secondary | ICD-10-CM | POA: Diagnosis present

## 2014-03-15 DIAGNOSIS — I252 Old myocardial infarction: Secondary | ICD-10-CM

## 2014-03-15 DIAGNOSIS — E785 Hyperlipidemia, unspecified: Secondary | ICD-10-CM | POA: Diagnosis present

## 2014-03-15 DIAGNOSIS — Z419 Encounter for procedure for purposes other than remedying health state, unspecified: Secondary | ICD-10-CM

## 2014-03-15 DIAGNOSIS — I255 Ischemic cardiomyopathy: Secondary | ICD-10-CM | POA: Diagnosis present

## 2014-03-15 HISTORY — PX: ROBOT ASSITED LAPAROSCOPIC NEPHROURETERECTOMY: SHX6077

## 2014-03-15 HISTORY — PX: CYSTOSCOPY W/ URETERAL STENT PLACEMENT: SHX1429

## 2014-03-15 LAB — HEMOGLOBIN AND HEMATOCRIT, BLOOD
HEMATOCRIT: 40.8 % (ref 39.0–52.0)
HEMOGLOBIN: 13.3 g/dL (ref 13.0–17.0)

## 2014-03-15 LAB — TYPE AND SCREEN
ABO/RH(D): O POS
Antibody Screen: NEGATIVE

## 2014-03-15 LAB — ABO/RH: ABO/RH(D): O POS

## 2014-03-15 SURGERY — ROBOT ASSITED LAPAROSCOPIC NEPHROURETERECTOMY
Anesthesia: General | Laterality: Left

## 2014-03-15 MED ORDER — SUCCINYLCHOLINE CHLORIDE 20 MG/ML IJ SOLN
INTRAMUSCULAR | Status: DC | PRN
  Administered 2014-03-15: 100 mg via INTRAVENOUS

## 2014-03-15 MED ORDER — PROMETHAZINE HCL 25 MG/ML IJ SOLN: 6.2500 mg | INTRAMUSCULAR | Status: DC | PRN

## 2014-03-15 MED ORDER — DEXAMETHASONE SODIUM PHOSPHATE 10 MG/ML IJ SOLN
INTRAMUSCULAR | Status: AC
  Filled 2014-03-15: qty 1

## 2014-03-15 MED ORDER — MIDAZOLAM HCL 2 MG/2ML IJ SOLN
INTRAMUSCULAR | Status: AC
  Filled 2014-03-15: qty 2

## 2014-03-15 MED ORDER — CARVEDILOL 6.25 MG PO TABS
6.2500 mg | ORAL_TABLET | Freq: Two times a day (BID) | ORAL | Status: DC
  Administered 2014-03-15 – 2014-03-17 (×4): 6.25 mg via ORAL
  Filled 2014-03-15 (×4): qty 1

## 2014-03-15 MED ORDER — LACTATED RINGERS IV SOLN
INTRAVENOUS | Status: DC
  Administered 2014-03-15: 15:00:00 via INTRAVENOUS
  Administered 2014-03-15: 1000 mL via INTRAVENOUS
  Administered 2014-03-15: 17:00:00 via INTRAVENOUS

## 2014-03-15 MED ORDER — FENTANYL CITRATE 0.05 MG/ML IJ SOLN
INTRAMUSCULAR | Status: AC
Start: 2014-03-15 — End: 2014-03-15
  Filled 2014-03-15: qty 5

## 2014-03-15 MED ORDER — BUPIVACAINE LIPOSOME 1.3 % IJ SUSP
20.0000 mL | Freq: Once | INTRAMUSCULAR | Status: AC
  Administered 2014-03-15: 20 mL
  Filled 2014-03-15: qty 20

## 2014-03-15 MED ORDER — CISATRACURIUM BESYLATE 20 MG/10ML IV SOLN
INTRAVENOUS | Status: AC
  Filled 2014-03-15: qty 20

## 2014-03-15 MED ORDER — SODIUM CHLORIDE 0.9 % IJ SOLN
INTRAMUSCULAR | Status: AC
  Filled 2014-03-15: qty 10

## 2014-03-15 MED ORDER — CEFAZOLIN SODIUM-DEXTROSE 2-3 GM-% IV SOLR
INTRAVENOUS | Status: AC
  Filled 2014-03-15: qty 50

## 2014-03-15 MED ORDER — OXYCODONE HCL 5 MG PO TABS
5.0000 mg | ORAL_TABLET | ORAL | Status: DC | PRN
  Administered 2014-03-16 – 2014-03-17 (×2): 5 mg via ORAL
  Filled 2014-03-15 (×2): qty 1

## 2014-03-15 MED ORDER — ONDANSETRON HCL 4 MG/2ML IJ SOLN
INTRAMUSCULAR | Status: DC | PRN
  Administered 2014-03-15: 4 mg via INTRAVENOUS

## 2014-03-15 MED ORDER — EPHEDRINE SULFATE 50 MG/ML IJ SOLN
INTRAMUSCULAR | Status: DC | PRN
  Administered 2014-03-15: 10 mg via INTRAVENOUS

## 2014-03-15 MED ORDER — GLYCOPYRROLATE 0.2 MG/ML IJ SOLN
INTRAMUSCULAR | Status: AC
  Filled 2014-03-15: qty 2

## 2014-03-15 MED ORDER — FENTANYL CITRATE 0.05 MG/ML IJ SOLN
INTRAMUSCULAR | Status: DC | PRN
  Administered 2014-03-15 (×5): 50 ug via INTRAVENOUS
  Administered 2014-03-15: 100 ug via INTRAVENOUS
  Administered 2014-03-15 (×3): 50 ug via INTRAVENOUS

## 2014-03-15 MED ORDER — PROPOFOL 10 MG/ML IV BOLUS
INTRAVENOUS | Status: AC
  Filled 2014-03-15: qty 20

## 2014-03-15 MED ORDER — IOHEXOL 300 MG/ML  SOLN
INTRAMUSCULAR | Status: DC | PRN
  Administered 2014-03-15: 20 mL via URETHRAL

## 2014-03-15 MED ORDER — LACTATED RINGERS IV SOLN: INTRAVENOUS | Status: DC

## 2014-03-15 MED ORDER — DEXTROSE-NACL 5-0.45 % IV SOLN
INTRAVENOUS | Status: DC
  Administered 2014-03-15 – 2014-03-17 (×4): via INTRAVENOUS

## 2014-03-15 MED ORDER — NEOSTIGMINE METHYLSULFATE 10 MG/10ML IV SOLN
INTRAVENOUS | Status: DC | PRN
  Administered 2014-03-15: 4 mg via INTRAVENOUS

## 2014-03-15 MED ORDER — HYDROMORPHONE HCL 1 MG/ML IJ SOLN
INTRAMUSCULAR | Status: AC
  Filled 2014-03-15: qty 1

## 2014-03-15 MED ORDER — RAMIPRIL 5 MG PO CAPS
5.0000 mg | ORAL_CAPSULE | Freq: Two times a day (BID) | ORAL | Status: DC
  Administered 2014-03-15 – 2014-03-17 (×4): 5 mg via ORAL
  Filled 2014-03-15 (×4): qty 1

## 2014-03-15 MED ORDER — LIDOCAINE HCL (PF) 2 % IJ SOLN
INTRAMUSCULAR | Status: DC | PRN
  Administered 2014-03-15: 100 mg via INTRADERMAL

## 2014-03-15 MED ORDER — MEPERIDINE HCL 50 MG/ML IJ SOLN: 6.2500 mg | INTRAMUSCULAR | Status: DC | PRN

## 2014-03-15 MED ORDER — CISATRACURIUM BESYLATE 20 MG/10ML IV SOLN
INTRAVENOUS | Status: AC
  Filled 2014-03-15: qty 10

## 2014-03-15 MED ORDER — ROSUVASTATIN CALCIUM 20 MG PO TABS
20.0000 mg | ORAL_TABLET | Freq: Every day | ORAL | Status: DC
  Administered 2014-03-15 – 2014-03-16 (×2): 20 mg via ORAL
  Filled 2014-03-15 (×2): qty 1

## 2014-03-15 MED ORDER — SODIUM CHLORIDE 0.9 % IJ SOLN
INTRAMUSCULAR | Status: DC | PRN
  Administered 2014-03-15: 20 mL

## 2014-03-15 MED ORDER — NEOSTIGMINE METHYLSULFATE 10 MG/10ML IV SOLN
INTRAVENOUS | Status: AC
  Filled 2014-03-15: qty 1

## 2014-03-15 MED ORDER — GLYCOPYRROLATE 0.2 MG/ML IJ SOLN
INTRAMUSCULAR | Status: DC | PRN
  Administered 2014-03-15: 0.6 mg via INTRAVENOUS
  Administered 2014-03-15: 0.2 mg via INTRAVENOUS

## 2014-03-15 MED ORDER — SODIUM CHLORIDE 0.9 % IR SOLN
Status: DC | PRN
  Administered 2014-03-15: 3000 mL via INTRAVESICAL

## 2014-03-15 MED ORDER — CEFAZOLIN SODIUM-DEXTROSE 2-3 GM-% IV SOLR
2.0000 g | INTRAVENOUS | Status: AC
  Administered 2014-03-15: 2 g via INTRAVENOUS

## 2014-03-15 MED ORDER — ONDANSETRON HCL 4 MG/2ML IJ SOLN
INTRAMUSCULAR | Status: AC
  Filled 2014-03-15: qty 2

## 2014-03-15 MED ORDER — LIDOCAINE HCL (CARDIAC) 20 MG/ML IV SOLN
INTRAVENOUS | Status: AC
  Filled 2014-03-15: qty 5

## 2014-03-15 MED ORDER — HYDROMORPHONE HCL 1 MG/ML IJ SOLN
0.5000 mg | INTRAMUSCULAR | Status: DC | PRN
  Administered 2014-03-17: 0.5 mg via INTRAVENOUS
  Filled 2014-03-15: qty 1

## 2014-03-15 MED ORDER — HYDROCODONE-ACETAMINOPHEN 5-325 MG PO TABS: 1.0000 | ORAL_TABLET | Freq: Four times a day (QID) | ORAL | Status: DC | PRN

## 2014-03-15 MED ORDER — SODIUM CHLORIDE 0.9 % IJ SOLN
INTRAMUSCULAR | Status: AC
  Filled 2014-03-15: qty 20

## 2014-03-15 MED ORDER — PROPOFOL 10 MG/ML IV BOLUS
INTRAVENOUS | Status: DC | PRN
  Administered 2014-03-15: 150 mg via INTRAVENOUS

## 2014-03-15 MED ORDER — HYDROMORPHONE HCL 1 MG/ML IJ SOLN
0.2500 mg | INTRAMUSCULAR | Status: DC | PRN
  Administered 2014-03-15 (×3): 0.5 mg via INTRAVENOUS

## 2014-03-15 MED ORDER — MIDAZOLAM HCL 5 MG/5ML IJ SOLN
INTRAMUSCULAR | Status: DC | PRN
  Administered 2014-03-15: 2 mg via INTRAVENOUS

## 2014-03-15 MED ORDER — ACETAMINOPHEN 500 MG PO TABS
1000.0000 mg | ORAL_TABLET | Freq: Four times a day (QID) | ORAL | Status: AC
  Administered 2014-03-15 – 2014-03-16 (×4): 1000 mg via ORAL
  Filled 2014-03-15 (×4): qty 2

## 2014-03-15 MED ORDER — CISATRACURIUM BESYLATE (PF) 10 MG/5ML IV SOLN
INTRAVENOUS | Status: DC | PRN
  Administered 2014-03-15 (×4): 2 mg via INTRAVENOUS
  Administered 2014-03-15: 6 mg via INTRAVENOUS

## 2014-03-15 MED ORDER — FENTANYL CITRATE 0.05 MG/ML IJ SOLN
INTRAMUSCULAR | Status: AC
  Filled 2014-03-15: qty 5

## 2014-03-15 MED ORDER — LACTATED RINGERS IR SOLN
Status: DC | PRN
  Administered 2014-03-15: 1000 mL

## 2014-03-15 MED ORDER — DEXAMETHASONE SODIUM PHOSPHATE 10 MG/ML IJ SOLN
INTRAMUSCULAR | Status: DC | PRN
  Administered 2014-03-15: 10 mg via INTRAVENOUS

## 2014-03-15 MED ORDER — EPHEDRINE SULFATE 50 MG/ML IJ SOLN
INTRAMUSCULAR | Status: AC
  Filled 2014-03-15: qty 1

## 2014-03-15 SURGICAL SUPPLY — 83 items
ADH SKN CLS APL DERMABOND .7 (GAUZE/BANDAGES/DRESSINGS) ×1
BAG SPEC RTRVL LRG 6X4 10 (ENDOMECHANICALS)
CANNULA SEAL DVNC (CANNULA) ×3 IMPLANT
CANNULA SEALS DA VINCI (CANNULA) ×6
CATH FOLEY 2WAY SLVR  5CC 18FR (CATHETERS) ×2
CATH FOLEY 2WAY SLVR 5CC 18FR (CATHETERS) ×1 IMPLANT
CATH FOLEY 3WAY  5CC 18FR (CATHETERS)
CATH FOLEY 3WAY 5CC 18FR (CATHETERS) IMPLANT
CHLORAPREP W/TINT 26ML (MISCELLANEOUS) ×3 IMPLANT
CLIP LIGATING HEM O LOK PURPLE (MISCELLANEOUS) ×3 IMPLANT
CLIP LIGATING HEMO LOK XL GOLD (MISCELLANEOUS) ×6 IMPLANT
CLIP LIGATING HEMO O LOK GREEN (MISCELLANEOUS) ×3 IMPLANT
CORDS BIPOLAR (ELECTRODE) ×6 IMPLANT
COVER SURGICAL LIGHT HANDLE (MISCELLANEOUS) ×3 IMPLANT
COVER TIP SHEARS 8 DVNC (MISCELLANEOUS) ×1 IMPLANT
COVER TIP SHEARS 8MM DA VINCI (MISCELLANEOUS) ×2
CUTTER ECHEON FLEX ENDO 45 340 (ENDOMECHANICALS) ×3 IMPLANT
DECANTER SPIKE VIAL GLASS SM (MISCELLANEOUS) ×3 IMPLANT
DERMABOND ADVANCED (GAUZE/BANDAGES/DRESSINGS) ×2
DERMABOND ADVANCED .7 DNX12 (GAUZE/BANDAGES/DRESSINGS) ×1 IMPLANT
DRAIN CHANNEL 15F RND FF 3/16 (WOUND CARE) IMPLANT
DRAPE INCISE IOBAN 66X45 STRL (DRAPES) ×3 IMPLANT
DRAPE LAPAROSCOPIC ABDOMINAL (DRAPES) ×3 IMPLANT
DRAPE SHEET LG 3/4 BI-LAMINATE (DRAPES) ×3 IMPLANT
DRAPE TABLE BACK 44X90 PK DISP (DRAPES) ×3 IMPLANT
DRAPE WARM FLUID 44X44 (DRAPE) ×3 IMPLANT
DRESSING SURGICEL FIBRLLR 1X2 (HEMOSTASIS) IMPLANT
DRSG SURGICEL FIBRILLAR 1X2 (HEMOSTASIS)
DRSG TEGADERM 4X4.75 (GAUZE/BANDAGES/DRESSINGS) ×3 IMPLANT
ELECT REM PT RETURN 9FT ADLT (ELECTROSURGICAL) ×3
ELECTRODE REM PT RTRN 9FT ADLT (ELECTROSURGICAL) ×1 IMPLANT
EVACUATOR SILICONE 100CC (DRAIN) ×3 IMPLANT
GAUZE SPONGE 4X4 12PLY STRL (GAUZE/BANDAGES/DRESSINGS) ×3 IMPLANT
GLOVE BIOGEL M STRL SZ7.5 (GLOVE) ×30 IMPLANT
GOWN STRL REUS W/TWL LRG LVL3 (GOWN DISPOSABLE) ×18 IMPLANT
GOWN STRL REUS W/TWL XL LVL3 (GOWN DISPOSABLE) IMPLANT
GUIDEWIRE ANG ZIPWIRE 038X150 (WIRE) ×3 IMPLANT
HEMOSTAT SURGICEL 4X8 (HEMOSTASIS) IMPLANT
KIT ACCESSORY DA VINCI DISP (KITS) ×2
KIT ACCESSORY DVNC DISP (KITS) ×1 IMPLANT
KIT BASIN OR (CUSTOM PROCEDURE TRAY) ×3 IMPLANT
LIQUID BAND (GAUZE/BANDAGES/DRESSINGS) IMPLANT
NEEDLE INSUFFLATION 14GA 120MM (NEEDLE) ×3 IMPLANT
PENCIL BUTTON HOLSTER BLD 10FT (ELECTRODE) ×3 IMPLANT
POSITIONER SURGICAL ARM (MISCELLANEOUS) ×6 IMPLANT
POUCH ENDO CATCH II 15MM (MISCELLANEOUS) ×3 IMPLANT
POUCH SPECIMEN RETRIEVAL 10MM (ENDOMECHANICALS) IMPLANT
RELOAD STAPLER WHITE 60MM (STAPLE) IMPLANT
RELOAD WH ECHELON 45 (STAPLE) ×12 IMPLANT
RETRACTOR LAPSCP 12X46 CVD (ENDOMECHANICALS) IMPLANT
RTRCTR LAPSCP 12X46 CVD (ENDOMECHANICALS)
SET TUBE IRRIG SUCTION NO TIP (IRRIGATION / IRRIGATOR) ×3 IMPLANT
SOLUTION ANTI FOG 6CC (MISCELLANEOUS) ×3 IMPLANT
SOLUTION ELECTROLUBE (MISCELLANEOUS) ×3 IMPLANT
SPONGE DRAIN TRACH 4X4 STRL 2S (GAUZE/BANDAGES/DRESSINGS) ×3 IMPLANT
SPONGE LAP 18X18 X RAY DECT (DISPOSABLE) ×3 IMPLANT
SPONGE LAP 4X18 X RAY DECT (DISPOSABLE) ×3 IMPLANT
STAPLE ECHEON FLEX 60 POW ENDO (STAPLE) IMPLANT
STAPLER RELOAD WHITE 60MM (STAPLE)
STENT CONTOUR 6FRX26X.038 (STENTS) ×3 IMPLANT
SURGILUBE 3G PEEL PACK STRL (MISCELLANEOUS) IMPLANT
SUT ETHILON 3 0 PS 1 (SUTURE) ×3 IMPLANT
SUT MNCRL AB 4-0 PS2 18 (SUTURE) ×6 IMPLANT
SUT MON AB 2-0 SH 27 (SUTURE) ×3
SUT MON AB 2-0 SH27 (SUTURE) ×1 IMPLANT
SUT PDS AB 1 CT1 27 (SUTURE) ×3 IMPLANT
SUT PDS AB 1 CTX 36 (SUTURE) ×6 IMPLANT
SUT V-LOC BARB 180 2/0GR6 GS22 (SUTURE) ×6
SUT VIC AB 2-0 SH 27 (SUTURE)
SUT VIC AB 2-0 SH 27X BRD (SUTURE) IMPLANT
SUT VICRYL 0 UR6 27IN ABS (SUTURE) ×3 IMPLANT
SUTURE V-LC BRB 180 2/0GR6GS22 (SUTURE) ×2 IMPLANT
TOWEL OR 17X26 10 PK STRL BLUE (TOWEL DISPOSABLE) ×3 IMPLANT
TOWEL OR NON WOVEN STRL DISP B (DISPOSABLE) ×3 IMPLANT
TRAY LAPAROSCOPIC (CUSTOM PROCEDURE TRAY) ×3 IMPLANT
TROCAR 12M 150ML BLUNT (TROCAR) ×3 IMPLANT
TROCAR BLADELESS OPT 5 75 (ENDOMECHANICALS) IMPLANT
TROCAR UNIVERSAL OPT 12M 100M (ENDOMECHANICALS) ×3 IMPLANT
TROCAR XCEL 12X100 BLDLESS (ENDOMECHANICALS) ×3 IMPLANT
TUBING INSUFFLATION 10FT LAP (TUBING) ×3 IMPLANT
URINEMETER 200ML W/220 (MISCELLANEOUS) ×3 IMPLANT
WATER STERILE IRR 1000ML UROMA (IV SOLUTION) IMPLANT
WATER STERILE IRR 1500ML POUR (IV SOLUTION) ×6 IMPLANT

## 2014-03-15 NOTE — Anesthesia Preprocedure Evaluation (Signed)
Anesthesia Evaluation  Patient identified by MRN, date of birth, ID band Patient awake    Reviewed: Allergy & Precautions, H&P , NPO status , Patient's Chart, lab work & pertinent test results  Airway Mallampati: II  TM Distance: >3 FB Neck ROM: Full    Dental no notable dental hx. (+) Teeth Intact, Dental Advisory Given, Missing, Partial Upper,    Pulmonary neg pulmonary ROS, former smoker,  breath sounds clear to auscultation  Pulmonary exam normal       Cardiovascular Exercise Tolerance: Good hypertension, Pt. on medications + angina + CAD, + Past MI and +CHF negative cardio ROS  + Cardiac Defibrillator Rhythm:Regular Rate:Normal  Ischemic cardiomyopathy with LV dysfunction. MI 2003   Neuro/Psych negative neurological ROS  negative psych ROS   GI/Hepatic negative GI ROS, Neg liver ROS,   Endo/Other  negative endocrine ROS  Renal/GU negative Renal ROS  negative genitourinary   Musculoskeletal negative musculoskeletal ROS (+)   Abdominal   Peds negative pediatric ROS (+)  Hematology negative hematology ROS (+)   Anesthesia Other Findings   Reproductive/Obstetrics negative OB ROS                             Anesthesia Physical  Anesthesia Plan  ASA: III  Anesthesia Plan: General   Post-op Pain Management:    Induction: Intravenous  Airway Management Planned: Oral ETT  Additional Equipment:   Intra-op Plan:   Post-operative Plan:   Informed Consent: I have reviewed the patients History and Physical, chart, labs and discussed the procedure including the risks, benefits and alternatives for the proposed anesthesia with the patient or authorized representative who has indicated his/her understanding and acceptance.   Dental Advisory Given  Plan Discussed with: CRNA and Surgeon  Anesthesia Plan Comments:         Anesthesia Quick Evaluation

## 2014-03-15 NOTE — Transfer of Care (Signed)
Immediate Anesthesia Transfer of Care Note  Patient: [name] [name]  Procedure(s) Performed: Procedure(s) (LRB): ROBOT ASSITED LAPAROSCOPIC NEPHROURETERECTOMY (Left) CYSTOSCOPY WITH RETROGRADE PYELOGRAM/URETERAL STENT PLACEMENT (Left)  Patient Location: PACU  Anesthesia Type: General  Level of Consciousness: sedated, patient cooperative and responds to stimulation  Airway & Oxygen Therapy: Patient Spontanous Breathing and Patient connected to face mask oxgen  Post-op Assessment: Report given to PACU RN and Post -op Vital signs reviewed and stable  Post vital signs: Reviewed and stable  Complications: No apparent anesthesia complications

## 2014-03-15 NOTE — H&P (Signed)
[name] [name] is an 58 y.o. male.    Chief Complaint: Pre-OP Left Robotic Nephrouretrectomy  HPI:    1 - Gross Hematuria  - first episode gross hematuria 08/2013 prompting eval wtih CT and Cysto with multifocal bladder cancer and left renal pelvis filling defect / mass as per below.    2 - High Grade Bladder Cancer - Found on hematuira eval 2015. His brother also had T1G3 and renal pelvis lesions requiring neph-U and BCG.  08/2013 - T1G3 Multifocal by TURBT with muscle in specimen, Lt JJ stent placed for passive dilation. 11/2013 - re-TURBT + stent exchange no evidence of disease with muscle then office stent removal.  3 - Left Renal Pelvis Filling Defect / Neoplasm - left renal pelvis / lower polle filling defect on CT 08/2013 and operative retrograde. Ureteroscopy not possible 08/2013 due to ureteral caliber. Ureteroscopy 11/2013 with likely lower pole hidden calyx (tumor) and significantly atypical renal pelvis cytology (favor high-grade urothelial carcinoma). Renovascular anatomy appears 2 vein (one retro-aortic), 1 artery.   PMH sig for MI/Stent,ICD Follows Cards (, Dr. Kirk Ruths, now no daily limitations. )  Today [name] is seen to proceed with left robotic nephroureterectomy.    Past Medical History  Diagnosis Date  . HYPERLIPIDEMIA   . HYPERTENSION   . CAD   . CARDIOMYOPATHY, ISCHEMIC   . CHF   . Left ventricular dysfunction   . 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 Myocardial infarction 2003  . Anginal pain     NO RECENT ANGINA  . Bladder cancer   . AICD (automatic cardioverter/defibrillator) present   . Chronic kidney disease     Past Surgical History  Procedure Laterality Date  . Pt had ear surgery    . Icd  09/16/2011  . Hernia repair  2010    right inguinal  . Transurethral resection of bladder tumor with gyrus (turbt-gyrus) N/A 10/03/2013    Procedure: TRANSURETHRAL RESECTION OF BLADDER TUMOR WITH GYRUS (TURBT-GYRUS);  Surgeon: Sharyn Creamer, MD;  Location: [facility];  Service: Urology;  Laterality: N/A;  . Cystoscopy w/ ureteral stent placement Left 10/03/2013    Procedure: CYSTOSCOPY WITH RETROGRADE PYELOGRAM/URETERAL STENT PLACEMENT;  Surgeon: Sharyn Creamer, MD;  Location: [facility];  Service: Urology;  Laterality: Left;  . Transurethral resection of bladder tumor with gyrus (turbt-gyrus) N/A 11/14/2013    Procedure: TRANSURETHRAL RESECTION OF BLADDER TUMOR WITH GYRUS (TURBT-GYRUS);  Surgeon: Alexis Frock, MD;  Location: [facility];  Service: Urology;  Laterality: N/A;  . Cystoscopy with retrograde pyelogram, ureteroscopy and stent placement Bilateral 11/14/2013    Procedure: CYSTOSCOPY WITH BILATERAL RETROGRADE PYELOGRAM, LEFT URETEROSCOPY WITH BIOPSY  AND STENT EXCHANGE;  Surgeon: Alexis Frock, MD;  Location: [facility];  Service: Urology;  Laterality: Bilateral;  . Implantable cardioverter defibrillator implant N/A 09/16/2011    Procedure: IMPLANTABLE CARDIOVERTER DEFIBRILLATOR IMPLANT;  Surgeon: Evans Lance, MD;  Location: [facility] CATH LAB;  Service: Cardiovascular;  Laterality: N/A;  . Cardiac catheterization      Family History  Problem Relation Age of Onset  . Heart failure     Social History:  reports that he quit smoking about 42 years ago. His smoking use included Cigarettes. He has a 3 pack-year smoking history. He has never used smokeless tobacco. He reports that he does not drink alcohol or use illicit drugs.  Allergies:  Allergies  Allergen Reactions  . Plavix [Clopidogrel] Itching and Rash    No prescriptions prior to admission  No results found for this or any previous visit (from the past 48 hour(s)). No results found.  Review of Systems  Constitutional: Negative.  Negative for fever.  HENT: Negative.   Eyes: Negative.   Respiratory: Negative.   Cardiovascular: Negative.   Gastrointestinal: Negative.   Genitourinary: Negative.   Musculoskeletal: Negative.   Skin: Negative.   Neurological:  Negative.   Endo/Heme/Allergies: Negative.   Psychiatric/Behavioral: Negative.     There were no vitals taken for this visit. Physical Exam  Constitutional: He appears well-developed.  HENT:  Head: Normocephalic.  Eyes: Pupils are equal, round, and reactive to light.  Neck: Normal range of motion.  Cardiovascular: Normal rate.   Respiratory: Effort normal.  GI: Soft.  Genitourinary: Penis normal.  Musculoskeletal: Normal range of motion.  Neurological: He is alert.  Skin: Skin is warm.  Psychiatric: He has a normal mood and affect. His behavior is normal. Judgment and thought content normal.     Assessment/Plan    1 - Gross Hematuria  - likely due to bladder cancer as per above.,   2 - High Grade Bladder Cancer -Bladder now disease free, but left renal pelvis with likely high grade tumor.   3 - Left Renal Pelvis Filling Defect / neoplasm - We rediscussed the role of radical nephroureterectomy with the overall goal of complete surgical excision (negative margins) and better staging / diagnosis. We specificallyre discussed that with removal of the kidney there would be an overall renal function decline with attendant risks of renal failure and need for dialysis in some cases, and need for kidney-friendly lifestyle post-op with excellent blood pressure and glycemic control. We then rediscussed surgical approaches including robotic and open techniques with robotic associated with a shorter convalescence. I showed the patient on their abdomen the approximately 4-6 incision (trocar) sites as well as presumed extraction sites with robotic approach as well as possible open incision sites. We specifically readdressed that there may be need to alter operative plans according to intraopertive findings including conversion to open procedure. I alsore mentioned that sometimes repositioning and additional ports (incisions) are needed for adequate bladder cuff removal. We rediscussed specific  peri-operative risks including bleeding, infection, deep vein thrombosis, pulmonary embolism, compartment syndrome, nuropathy / neuropraxia, heart attack, stroke, death, as well as long-term risks such as non-cure / need for additional therapy and need for imaging and lab based post-op surveillance protocols. We rediscussed typical hospital course of approximately 2 day hospitalization, need for peri-operative drains / catheters (foley for approx 10 days with bladder cuff excision), and typical post-hospital course with return to most non-strenuous activities by 2 weeks and ability to return to most jobs and more strenuous activity such as exercise by 6 weeks.    Gola Bribiesca 03/15/2014, 6:51 AM

## 2014-03-15 NOTE — Anesthesia Postprocedure Evaluation (Signed)
  Anesthesia Post-op Note  Patient: [name] [name]  Procedure(s) Performed: Procedure(s) (LRB): ROBOT ASSITED LAPAROSCOPIC NEPHROURETERECTOMY (Left) CYSTOSCOPY WITH RETROGRADE PYELOGRAM/URETERAL STENT PLACEMENT (Left)  Patient Location: PACU  Anesthesia Type: General  Level of Consciousness: awake and alert   Airway and Oxygen Therapy: Patient Spontanous Breathing  Post-op Pain: mild  Post-op Assessment: Post-op Vital signs reviewed, Patient's Cardiovascular Status Stable, Respiratory Function Stable, Patent Airway and No signs of Nausea or vomiting  Last Vitals:  Filed Vitals:   03/15/14 1813  BP: 117/72  Pulse: 67  Temp: 36.5 C  Resp: 15    Post-op Vital Signs: stable   Complications: No apparent anesthesia complications

## 2014-03-15 NOTE — Brief Op Note (Signed)
03/15/2014  5:10 PM  PATIENT:  [name] [name]  58 y.o. male  PRE-OPERATIVE DIAGNOSIS:  LEFT RENAL PELVIS CANCER  POST-OPERATIVE DIAGNOSIS:  left renal pelvis cancer  PROCEDURE:  Procedure(s): ROBOT ASSITED LAPAROSCOPIC NEPHROURETERECTOMY (Left) CYSTOSCOPY WITH RETROGRADE PYELOGRAM/URETERAL STENT PLACEMENT (Left)  SURGEON:  Surgeon(s) and Role:    * Alexis Frock, MD - Primary  PHYSICIAN ASSISTANT:   ASSISTANTS: Clemetine Marker, PA   ANESTHESIA:   general  EBL:  Total I/O In: 1000 [I.V.:1000] Out: 125 [Urine:100; Blood:25]  BLOOD ADMINISTERED:none  DRAINS: 1 - JP to bulb, 2 - Foley to straight drain   LOCAL MEDICATIONS USED:  MARCAINE     SPECIMEN:  Source of Specimen:  1 - Left Nephro-ureterectomy 2 - Left peri-aortic lymph nodes  DISPOSITION OF SPECIMEN:  PATHOLOGY  COUNTS:  YES  TOURNIQUET:  * No tourniquets in log *  DICTATION: .Other Dictation: Dictation Number [id number]  PLAN OF CARE: Admit to inpatient   PATIENT DISPOSITION:  PACU - hemodynamically stable.   Delay start of Pharmacological VTE agent (>24hrs) due to surgical blood loss or risk of bleeding: yes

## 2014-03-16 LAB — BASIC METABOLIC PANEL
ANION GAP: 10 (ref 5–15)
BUN: 14 mg/dL (ref 6–23)
CALCIUM: 8.9 mg/dL (ref 8.4–10.5)
CHLORIDE: 97 meq/L (ref 96–112)
CO2: 25 mEq/L (ref 19–32)
Creatinine, Ser: 1.26 mg/dL (ref 0.50–1.35)
GFR calc non Af Amer: 60 mL/min — ABNORMAL LOW (ref >90)
GFR, EST AFRICAN AMERICAN: 70 mL/min — AB (ref >90)
Glucose, Bld: 145 mg/dL — ABNORMAL HIGH (ref 70–99)
Potassium: 4.5 mEq/L (ref 3.7–5.3)
Sodium: 132 mEq/L — ABNORMAL LOW (ref 137–147)

## 2014-03-16 LAB — HEMOGLOBIN AND HEMATOCRIT, BLOOD
HCT: 40.7 % (ref 39.0–52.0)
Hemoglobin: 13.5 g/dL (ref 13.0–17.0)

## 2014-03-16 NOTE — Progress Notes (Signed)
Looks good Labs normal Incisions look good Drain 75 ml Advancing diet and home without drain tomorrow

## 2014-03-16 NOTE — Op Note (Signed)
NAMESAMMUEL, BLICK                ACCOUNT NO.:  [id number]  MEDICAL RECORD NO.:  [id number]  LOCATION:  2979                         FACILITY:  [facility]  PHYSICIAN:  Alexis Frock, MD     DATE OF BIRTH:  11-05-1955  DATE OF PROCEDURE: 03/15/2014  DATE OF DISCHARGE:                              OPERATIVE REPORT   DIAGNOSIS:  Left renal pelvis cancer.  PROCEDURE: 1. Robotic-assisted laparoscopic left radical nephroureterectomy. 2. Cystoscopy with left retrograde pyelogram and interpretation. 3. Insertion of left ureteral stent 6 x 26, no tethering.  ASSISTANT:  Clemetine Marker, PA  ESTIMATED BLOOD LOSS:  Nil.  COMPLICATIONS:  None.  SPECIMENS: 1. Left nephroureterectomy with bladder cuff. 2. Left periaortic lymph nodes for permanent pathology.  FINDINGS: 1. Two arteries, 2 veins, left renovascular anatomy with 2 small     accessory vessels comprising the 2nd areas. 2. Significant desmoplastic reaction and somewhat enlarged lymph nodes     in the periaortic region adjacent to the kidney and left renal     hilum.  DRAINS: 1. Jackson-Pratt drain to bulb suction. 2. Foley catheter straight drain.  INDICATION:  Mr. Kissinger is a pleasant 58 year old gentleman with history of high-grade superficial bladder cancer.  He has found on staging of this to have a questionable large filling defect in the lower pole of his left kidney.  He has undergone multiple nondiagnostic procedures to help confirm this including left ureteroscopy, however, his calyx in question is very acutely angle to the ureter such that direct visualization is not possible ureteroscopically.  He has undergone washings from this area consistent with high-grade urothelial carcinoma.  Options were discussed with the patient including surveillance versus immunotherapy versus chemotherapy versus surgical extirpation with left nephroureterectomy with bladder being felt to be the standard of care, and he wished to  proceed.  Informed consent was obtained and placed in medical record.  PROCEDURE IN DETAIL:  The patient being Masiah Woody and procedure being left robotic nephroureterectomy was confirmed.  Procedure was carried out.  Time-out was performed.  Intravenous antibiotics were administered.  General endotracheal anesthesia was introduced.  The patient was placed into a low lithotomy position.  Sterile field was created by prepping and draping the patient's penis, perineum, proximal thighs using iodine x3.  Next, cystourethroscopy performed using a 22- French rigid cystoscope with 12-degree offset lens.  Inspection of the anterior-posterior tremor resection of bladder revealed no diverticula, calcifications, papular lesions.  The left ureter was cannulated with a 6-French catheter and left retrograde pyelogram was obtained.  Left retrograde pyelogram demonstrates single left ureter, single system left kidney.  Again the most lower pole calyx was not visualized and felt to be hidden as per prior retrograde pyelography, a 0.038 zip wire was advanced level in mid pole over which a new 6 x 26 no contra type stent was placed, good proximal and distal deployment were noted.  Foley catheter was placed per urethra to straight drain.  The patient was then repositioned into a left side up full flank position applying 15 degrees stable flexion, superior arm elevator, sequential compression devices, bottom leg bent, top leg straight and further fashioned on the operative table  using 3-inch tape over foam padding crossed his pelvis and xiphoid area.  Beanbag was also used as axillary roll.  All pressure points were visualized and felt to be appropriately padded.  A new sterile field was created by prepping the patient's entire left flank and abdomen using chlorhexidine gluconate, and a high-flow low pressure.  Pneumoperitoneum was obtained using Veress technique.  The left lower quadrant having passed  the aspiration and drop test.  Next, a 12 mm robotic camera port was placed in the paramedian location approximately 1 handbreadth superior lateral to the umbilicus.  Laparoscopic examination of peritoneal cavity revealed no significant adhesions, no visceral injury. Additional ports were placed as follows;  Left subcostal 8-mm robotic port purposely 3 fingerbreadths superior to the plane of the camera port, a 12 mm assistant port in the midline 3 fingerbreadths superior to the plane of the camera port, a 12 mm assistant port in the midline just below the umbilicus, and 8 mm far lateral robotic port 4 fingerbreadths superior medial to the anterior iliac spine and an 8-mm robotic port in the paramedian location approximately 4 fingerbreadths superior to the pubic ramus taking great care to avoid bladder perforation and this did not occur under laparoscopic vision. Robot was docked and passed through electronic checks.  Initial attention was at development of left retroperitoneum.  Incision was made lateral to the descending colon from the area of the splenic flexure towards the area of the internal ring. The colon was carefully mobilized medially.  The tail and mid sections of the pancreas were noted and also carefully flipped medially. The lateral attachments to the spleen were taken down allowing the spleen to also rule medially and superiorly away from the superior and anterior border of the kidney.  Lower pole of the kidney was identified and placed on gentle lateral traction.  Dissection proceeded medial to this. The gonadal vein and ureter were encountered and placed on gentle lateral traction.  Dissection proceeded with this triangle superiorly towards the area of the renal hilum and towards the renal hilum, there was significant desmoplastic reaction as well as some lymphadenopathy that was seen directly adherent to the aorta.  Very carefully, this the tissue was mobilized.   Lymphostasis achieved with cold clips and this periaortic lymphatic tissue was set aside and labeled as such for permanent pathology.  Renal hilum was encountered and was quite complex as expected consisted of a dominant renal artery and vein as well as secondary vessels that were posterior to these including a retroaortic small accessory vein and another small accessory artery.  The 2 arteries were controlled using extra large Hem-o-lok clip proximal and vascular stapler distal.  The small accessory vein was controlled using extra large Hem-o-lok clip 3 down and 2 up and the dominant renal vein was controlled proximal to the cuff of the adrenal using vascular stapler and dissection proceeded just lateral to the aorta superiorly releasing superior medial attachments away from the adrenal and the superior aspect of the kidney.  As the superior attachments were taken down, fraying superior renal fat from the inferior diaphragm and lateral attachments were also taken down.  This completely mobilized the kidney. Dissection then proceeded distally along the ureter.  The gonadal vein was purposely doubly clipped and ligated distally to keep it out of the ureteral and dissection proceeded circumferentially around the ureter keeping what appeared to be a rim of normal fibrin tissue towards the pelvis.  Very careful dissection was performed in the area  of the iliac vessels to ensure no injury to these structures which did not occur. The ureter was carefully mobilized towards the pelvis away from the obturator nerve and finally just lateral to the superior vesical artery. At this level, the dissection widened approximately 3 cm diameter keeping what appeared to be bladder cuff of normal appearing detrusor muscle fibers with a dissection specimen just over entering the urinary bladder.  A 2-0 V-loc stay suture was applied to the medial most aspect of the dissection and used as a stay suture.  The  bladder cuff was then excised coldly. This completely freed up the left adnexa for a new specimen which was placed in an extra large EndoCatch bag for later retrieval.  The bladder cuff area was closed using the previous 2-0 V- loc stay suture running back and forth x2.  This resulted in excellent closure of the bladder visually.  Following these maneuvers, hemostasis appeared excellent.  All sponge and needle counts were correct.  Closed suction drain was brought through the previous left lateral assistant port site to the peritoneal cavity.  Robot was then undocked.  Specimens was removed by extending the previous superior most assistant port site for total distance approximately 5 cm removing the specimen and setting it aside for permanent pathology.  The previous 12 mm camera port site and inferior assistant port site were closed with fascia using figure-of- eight 2-0 Vicryl.  The extraction site was closed with fascia using figure-of-eight PDS x5 and level of Scarpa's using running Vicryl.  All incision sites were infiltrated with dilute lyophilized Marcaine and closed at level of skin using subcuticular Monocryl followed by Dermabond.  Drain stitch was applied.  Procedure was then terminated. The patient tolerated the procedure well with no immediate periprocedural complications.  The patient was taken to the Postanesthesia Care Unit in stable condition.          ______________________________ Alexis Frock, MD     TM/MEDQ  D:  03/15/2014  T:  03/16/2014  Job:  [id number]

## 2014-03-17 HISTORY — PX: OTHER SURGICAL HISTORY: SHX169

## 2014-03-17 MED ORDER — ACETAMINOPHEN 325 MG PO TABS
650.0000 mg | ORAL_TABLET | ORAL | Status: DC | PRN
  Administered 2014-03-17: 650 mg via ORAL
  Filled 2014-03-17: qty 2

## 2014-03-17 MED ORDER — ONDANSETRON HCL 4 MG PO TABS
4.0000 mg | ORAL_TABLET | Freq: Once | ORAL | Status: AC
  Administered 2014-03-17: 4 mg via ORAL
  Filled 2014-03-17: qty 1

## 2014-03-17 NOTE — Discharge Instructions (Signed)
1.  Activity:  You are encouraged to ambulate frequently (about every hour during waking hours) to help prevent blood clots from forming in your legs or lungs.  However, you should not engage in any heavy lifting (> 10-15 lbs), strenuous activity, or straining. 2. Diet: You should advance your diet as instructed by your physician.  It will be normal to have some bloating, nausea, and abdominal discomfort intermittently. 3. Prescriptions:  You will be provided a prescription for pain medication to take as needed.  If your pain is not severe enough to require the prescription pain medication, you may take extra strength Tylenol instead which will have less side effects.  You should also take a prescribed stool softener to avoid straining with bowel movements as the prescription pain medication may constipate you. 4. Incisions: You may remove your dressing bandages 48 hours after surgery if not removed in the hospital.  You will either have some small staples or special tissue glue at each of the incision sites. Once the bandages are removed (if present), the incisions may stay open to air.  You may start showering (but not soaking or bathing in water) the 2nd day after surgery and the incisions simply need to be patted dry after the shower.  No additional care is needed. 5. What to call us about: You should call the office [phone]) if you develop fever > 101 or develop persistent vomiting.    Foley Catheter Care A Foley catheter is a soft, flexible tube that is placed into the bladder to drain urine. A Foley catheter may be inserted if:  You leak urine or are not able to control when you urinate (urinary incontinence).  You are not able to urinate when you need to (urinary retention).  You had prostate surgery or surgery on the genitals.  You have certain medical conditions, such as multiple sclerosis, dementia, or a spinal cord injury. If you are going home with a Foley catheter in place,  follow the instructions below. TAKING CARE OF THE CATHETER 1. Wash your hands with soap and water. 2. Using mild soap and warm water on a clean washcloth:  Clean the area on your body closest to the catheter insertion site using a circular motion, moving away from the catheter. Never wipe toward the catheter because this could sweep bacteria up into the urethra and cause infection.  Remove all traces of soap. Pat the area dry with a clean towel. For males, reposition the foreskin. 3. Attach the catheter to your leg so there is no tension on the catheter. Use adhesive tape or a leg strap. If you are using adhesive tape, remove any sticky residue left behind by the previous tape you used. 4. Keep the drainage bag below the level of the bladder, but keep it off the floor. 5. Check throughout the day to be sure the catheter is working and urine is draining freely. Make sure the tubing does not become kinked. 6. Do not pull on the catheter or try to remove it. Pulling could damage internal tissues. TAKING CARE OF THE DRAINAGE BAGS You will be given two drainage bags to take home. One is a large overnight drainage bag, and the other is a smaller leg bag that fits underneath clothing. You may wear the overnight bag at any time, but you should never wear the smaller leg bag at night. Follow the instructions below for how to empty, change, and clean your drainage bags. Emptying the Drainage Bag You must  empty your drainage bag when it is  - full or at least 2-3 times a day. 1. Wash your hands with soap and water. 2. Keep the drainage bag below your hips, below the level of your bladder. This stops urine from going back into the tubing and into your bladder. 3. Hold the dirty bag over the toilet or a clean container. 4. Open the pour spout at the bottom of the bag and empty the urine into the toilet or container. Do not let the pour spout touch the toilet, container, or any other surface. Doing so can  place bacteria on the bag, which can cause an infection. 5. Clean the pour spout with a gauze pad or cotton ball that has rubbing alcohol on it. 6. Close the pour spout. 7. Attach the bag to your leg with adhesive tape or a leg strap. 8. Wash your hands well. Changing the Drainage Bag Change your drainage bag once a month or sooner if it starts to smell bad or look dirty. Below are steps to follow when changing the drainage bag. 1. Wash your hands with soap and water. 2. Pinch off the rubber catheter so that urine does not spill out. 3. Disconnect the catheter tube from the drainage tube at the connection valve. Do not let the tubes touch any surface. 4. Clean the end of the catheter tube with an alcohol wipe. Use a different alcohol wipe to clean the end of the drainage tube. 5. Connect the catheter tube to the drainage tube of the clean drainage bag. 6. Attach the new bag to the leg with adhesive tape or a leg strap. Avoid attaching the new bag too tightly. 7. Wash your hands well. Cleaning the Drainage Bag 1. Wash your hands with soap and water. 2. Wash the bag in warm, soapy water. 3. Rinse the bag thoroughly with warm water. 4. Fill the bag with a solution of white vinegar and water (1 cup vinegar to 1 qt warm water [.2 L vinegar to 1 L warm water]). Close the bag and soak it for 30 minutes in the solution. 5. Rinse the bag with warm water. 6. Hang the bag to dry with the pour spout open and hanging downward. 7. Store the clean bag (once it is dry) in a clean plastic bag. 8. Wash your hands well. PREVENTING INFECTION  Wash your hands before and after handling your catheter.  Take showers daily and wash the area where the catheter enters your body. Do not take baths. Replace wet leg straps with dry ones, if this applies.  Do not use powders, sprays, or lotions on the genital area. Only use creams, lotions, or ointments as directed by your caregiver.  For females, wipe from front to  back after each bowel movement.  Drink enough fluids to keep your urine clear or pale yellow unless you have a fluid restriction.  Do not let the drainage bag or tubing touch or lie on the floor.  Wear cotton underwear to absorb moisture and to keep your skin drier. SEEK MEDICAL CARE IF:   Your urine is cloudy or smells unusually bad.  Your catheter becomes clogged.  You are not draining urine into the bag or your bladder feels full.  Your catheter starts to leak. SEEK IMMEDIATE MEDICAL CARE IF:   You have pain, swelling, redness, or pus where the catheter enters the body.  You have pain in the abdomen, legs, lower back, or bladder.  You have a fever.  You see blood fill the catheter, or your urine is pink or red.  You have nausea, vomiting, or chills.  Your catheter gets pulled out. MAKE SURE YOU:   Understand these instructions.  Will watch your condition.  Will get help right away if you are not doing well or get worse. Document Released: 03/15/2005 Document Revised: 07/30/2013 Document Reviewed: 03/06/2012 [facility] Patient Information 2015 [place], [place]. This information is not intended to replace advice given to you by your health care provider. Make sure you discuss any questions you have with your health care provider.

## 2014-03-17 NOTE — Progress Notes (Signed)
Looks great Dc home with foley Dc drain

## 2014-03-18 ENCOUNTER — Encounter (HOSPITAL_COMMUNITY): Payer: Self-pay | Admitting: Urology

## 2014-04-18 ENCOUNTER — Ambulatory Visit (INDEPENDENT_AMBULATORY_CARE_PROVIDER_SITE_OTHER): Payer: Managed Care, Other (non HMO) | Admitting: *Deleted

## 2014-04-18 ENCOUNTER — Telehealth: Payer: Self-pay | Admitting: Cardiology

## 2014-04-18 DIAGNOSIS — I429 Cardiomyopathy, unspecified: Secondary | ICD-10-CM

## 2014-04-18 DIAGNOSIS — I509 Heart failure, unspecified: Secondary | ICD-10-CM

## 2014-04-18 NOTE — Telephone Encounter (Signed)
LMOVM reminding pt to send remote transmission.   

## 2014-04-19 ENCOUNTER — Encounter: Payer: Self-pay | Admitting: Internal Medicine

## 2014-04-22 LAB — MDC_IDC_ENUM_SESS_TYPE_REMOTE
HighPow Impedance: 66 Ohm
Implantable Pulse Generator Serial Number: 1033528
Lead Channel Impedance Value: 530 Ohm
Lead Channel Setting Pacing Amplitude: 2.5 V
Lead Channel Setting Pacing Pulse Width: 0.5 ms
MDC IDC MSMT BATTERY REMAINING LONGEVITY: 78 mo
MDC IDC SET LEADCHNL RV SENSING SENSITIVITY: 0.5 mV
MDC IDC SET ZONE DETECTION INTERVAL: 310 ms
MDC IDC STAT BRADY RV PERCENT PACED: 1 %
Zone Setting Detection Interval: 270 ms

## 2014-04-22 NOTE — Progress Notes (Signed)
Remote ICD transmission.   

## 2014-04-23 ENCOUNTER — Encounter: Payer: Self-pay | Admitting: Family Medicine

## 2014-04-23 ENCOUNTER — Ambulatory Visit (INDEPENDENT_AMBULATORY_CARE_PROVIDER_SITE_OTHER): Payer: Managed Care, Other (non HMO) | Admitting: Family Medicine

## 2014-04-23 VITALS — BP 126/80 | HR 88 | Temp 97.6°F | Ht 68.0 in | Wt 164.0 lb

## 2014-04-23 DIAGNOSIS — B351 Tinea unguium: Secondary | ICD-10-CM

## 2014-04-23 DIAGNOSIS — M25572 Pain in left ankle and joints of left foot: Secondary | ICD-10-CM

## 2014-04-23 NOTE — Progress Notes (Signed)
   Subjective:    Patient ID: [name] [name], male    DOB: 11/19/1955, 59 y.o.   MRN: [id number]  HPI  59 year old male comes in today to discuss problems with his left foot. He complains of left foot pain/burning at base of toes and toenail fungus on left great toe.   Patient Active Problem List   Diagnosis Date Noted  . Ureteral mass 03/15/2014  . Automatic implantable cardioverter-defibrillator in situ 09/22/2011  . CARDIOMYOPATHY, ISCHEMIC 08/21/2009  . HYPERLIPIDEMIA 11/20/2008  . HYPERTENSION 11/20/2008  . CAD 11/20/2008  . CHF 11/20/2008   Outpatient Encounter Prescriptions as of 04/23/2014  Medication Sig  . carvedilol (COREG) 6.25 MG tablet TAKE 1 TABLET BY MOUTH TWICE DAILY  . CRESTOR 20 MG tablet TAKE 1 TABLET BY MOUTH AT BEDTIME  . ramipril (ALTACE) 5 MG capsule TAKE 1 CAPSULE BY MOUTH TWICE DAILY  . [DISCONTINUED] HYDROcodone-acetaminophen (NORCO) 5-325 MG per tablet Take 1-2 tablets by mouth every 6 (six) hours as needed.     Review of Systems     Objective:   Physical Exam  Constitutional: He is oriented to person, place, and time. He appears well-developed and well-nourished. No distress.  HENT:  Head: Normocephalic and atraumatic.  Mouth/Throat: Oropharynx is clear and moist.  Eyes: Conjunctivae are normal. Pupils are equal, round, and reactive to light.  Neck: Normal range of motion. Neck supple.  Cardiovascular: Normal rate, regular rhythm and normal heart sounds.   No murmur heard. Pulmonary/Chest: Effort normal and breath sounds normal. No respiratory distress. He has no wheezes. He has no rales.  Abdominal: Soft. Bowel sounds are normal.  Musculoskeletal: He exhibits tenderness (at left 2nd toe at MTP plantar surface).  Neurological: He is alert and oriented to person, place, and time. He has normal reflexes.  Skin: Skin is warm and dry.  Marked onychomycosis with deformity left lg toenail  Psychiatric: He has a normal mood and affect. His behavior is  normal. Judgment and thought content normal.    BP 126/80 mmHg  Pulse 88  Temp(Src) 97.6 F (36.4 C) (Oral)  Ht 5\' 8"  (1.727 m)  Wt 164 lb (74.39 kg)  BMI 24.94 kg/m2       Assessment & Plan:   1. Pain in joint, ankle and foot, left   2. Onychomycosis     No orders of the defined types were placed in this encounter.    Orders Placed This Encounter  Procedures  . Ambulatory referral to Podiatry    Referral Priority:  Routine    Referral Type:  Consultation    Referral Reason:  Specialty Services Required    Requested Specialty:  Podiatry    Number of Visits Requested:  1    Labs pending Health Maintenance Diet and exercise encouraged Continue all meds as discussed Follow up in 6 months  Claretta Fraise, MD

## 2014-04-30 ENCOUNTER — Encounter: Payer: Self-pay | Admitting: Cardiology

## 2014-05-14 ENCOUNTER — Encounter: Payer: Self-pay | Admitting: Cardiology

## 2014-06-05 ENCOUNTER — Other Ambulatory Visit: Payer: Self-pay | Admitting: Cardiology

## 2014-07-18 ENCOUNTER — Ambulatory Visit (INDEPENDENT_AMBULATORY_CARE_PROVIDER_SITE_OTHER): Payer: Managed Care, Other (non HMO) | Admitting: *Deleted

## 2014-07-18 ENCOUNTER — Telehealth: Payer: Self-pay | Admitting: Cardiology

## 2014-07-18 DIAGNOSIS — I429 Cardiomyopathy, unspecified: Secondary | ICD-10-CM

## 2014-07-18 DIAGNOSIS — I509 Heart failure, unspecified: Secondary | ICD-10-CM | POA: Diagnosis not present

## 2014-07-18 NOTE — Telephone Encounter (Signed)
LMOVM reminding pt to send remote transmission.   

## 2014-07-19 NOTE — Progress Notes (Signed)
Remote ICD transmission.   

## 2014-08-02 LAB — CUP PACEART REMOTE DEVICE CHECK
Date Time Interrogation Session: 20160506152333
HIGH POWER IMPEDANCE MEASURED VALUE: 68 Ohm
Lead Channel Impedance Value: 530 Ohm
MDC IDC MSMT BATTERY REMAINING PERCENTAGE: 73 %
MDC IDC MSMT LEADCHNL RV SENSING INTR AMPL: 12 mV
MDC IDC SET LEADCHNL RV PACING AMPLITUDE: 2.5 V
MDC IDC SET LEADCHNL RV PACING PULSEWIDTH: 0.5 ms
MDC IDC SET LEADCHNL RV SENSING SENSITIVITY: 0.5 mV
MDC IDC STAT BRADY RV PERCENT PACED: 1 % — AB
Pulse Gen Serial Number: 1033528
Zone Setting Detection Interval: 270 ms
Zone Setting Detection Interval: 310 ms

## 2014-08-08 ENCOUNTER — Other Ambulatory Visit: Payer: Self-pay | Admitting: Cardiology

## 2014-08-08 ENCOUNTER — Encounter: Payer: Self-pay | Admitting: Cardiology

## 2014-08-08 ENCOUNTER — Other Ambulatory Visit: Payer: Self-pay | Admitting: *Deleted

## 2014-08-08 MED ORDER — CARVEDILOL 6.25 MG PO TABS: 6.2500 mg | ORAL_TABLET | Freq: Two times a day (BID) | ORAL | Status: DC

## 2014-08-15 ENCOUNTER — Encounter: Payer: Self-pay | Admitting: Internal Medicine

## 2014-08-16 NOTE — Discharge Summary (Signed)
Physician Discharge Summary  Patient ID: [name] [name] MRN: [id number] DOB/AGE: 61-Mar-1957 61 y.o.  Admit date: 03/15/2014 Discharge date: 03/18/2015  Admission Diagnoses: Left Renal Pelvis Filling Defect, Abnormal cytology  Discharge Diagnoses:   Left Renal Pelvis Filling Defect, Abnormal cytology  Discharged Condition: good  Hospital Course:   Pt unerwent left robotic nephroureterectomy on 03/15/14, the day of admission without acute complications for management of left renal filing deffect with very high suspicion of urothelial carcinoma without acute complications. He was discharged with foley in place POD 2 by Dr. Matilde Sprang who evaluated the patient prior to discharge. Final pathology pT0 without carcinoma.   Consults: None  Significant Diagnostic Studies: labs: as per baove  Treatments: surgery:  left robotic nephroureterectomy on 03/15/14,  Discharge Exam: Blood pressure 113/68, pulse 72, temperature 98.3 F (36.8 C), temperature source Oral, resp. rate 18, height 5\' 8"  (1.727 m), weight 79.493 kg (175 lb 4 oz), SpO2 94 %. exam documented by Dr. Matilde Sprang, the discharging physician, on progress note 03/17/14.  Disposition: 01-Home or Self Care     Medication List    STOP taking these medications        aspirin 325 MG tablet     carvedilol 6.25 MG tablet  Commonly known as:  COREG      TAKE these medications        CRESTOR 20 MG tablet  Generic drug:  rosuvastatin  TAKE 1 TABLET BY MOUTH AT BEDTIME     ramipril 5 MG capsule  Commonly known as:  ALTACE  TAKE 1 CAPSULE BY MOUTH TWICE DAILY           Follow-up Information    Follow up with Alexis Frock, MD On 03/25/2014.   Specialty:  Urology   Why:  at 11:00   Contact information:   [place] [address] [phone]       Follow up with Ardis Hughs, MD.   Specialty:  Urology   Why:  as scheduled   Contact information:   [place]  [address] [phone]       Signed: Alexis Frock 08/16/2014, 6:30 PM

## 2014-08-22 ENCOUNTER — Encounter: Payer: Self-pay | Admitting: Cardiology

## 2014-10-22 ENCOUNTER — Encounter: Payer: Self-pay | Admitting: *Deleted

## 2014-11-08 ENCOUNTER — Other Ambulatory Visit: Payer: Self-pay | Admitting: Internal Medicine

## 2014-11-11 NOTE — Progress Notes (Signed)
HPI: FU coronary artery disease. He had an acute anterior infarct in 2003. At that time, he had PCI of his LAD and diagonal. Note, the Lcx and RCA had no disease. Carotid Dopplers in June of 2009 showed normal carotids. Echocardiogram in May of 2013 showed an ejection fraction of 30-35%. There was mild mitral regurgitation and trace aortic insufficiency. There was mild left atrial enlargement. Patient had ICD placed in June of 2013. Nuclear study June 2015 showed an ejection fraction of 33%. There was scar in the LAD territory with minimal peri-infarct ischemia. Since last seen, the patient denies any dyspnea on exertion, orthopnea, PND, pedal edema, palpitations, syncope or chest pain.   Current Outpatient Prescriptions  Medication Sig Dispense Refill  . carvedilol (COREG) 6.25 MG tablet Take 1 tablet (6.25 mg total) by mouth 2 (two) times daily. 180 tablet 0  . ramipril (ALTACE) 5 MG capsule TAKE 1 CAPSULE BY MOUTH TWICE DAILY 180 capsule 2  . rosuvastatin (CRESTOR) 20 MG tablet TAKE 1 TABLET BY MOUTH AT BEDTIME 90 tablet 3   No current facility-administered medications for this visit.     Past Medical History  Diagnosis Date  . HYPERLIPIDEMIA   . HYPERTENSION   . CAD   . CARDIOMYOPATHY, ISCHEMIC   . CHF   . Left ventricular dysfunction   . 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 Myocardial infarction 2003  . Anginal pain     NO RECENT ANGINA  . Bladder cancer   . AICD (automatic cardioverter/defibrillator) present   . Chronic kidney disease     Past Surgical History  Procedure Laterality Date  . Pt had ear surgery    . Icd  09/16/2011  . Hernia repair  2010    right inguinal  . Transurethral resection of bladder tumor with gyrus (turbt-gyrus) N/A 10/03/2013    Procedure: TRANSURETHRAL RESECTION OF BLADDER TUMOR WITH GYRUS (TURBT-GYRUS);  Surgeon: Sharyn Creamer, MD;  Location: [facility];  Service: Urology;  Laterality: N/A;  . Cystoscopy w/  ureteral stent placement Left 10/03/2013    Procedure: CYSTOSCOPY WITH RETROGRADE PYELOGRAM/URETERAL STENT PLACEMENT;  Surgeon: Sharyn Creamer, MD;  Location: [facility];  Service: Urology;  Laterality: Left;  . Transurethral resection of bladder tumor with gyrus (turbt-gyrus) N/A 11/14/2013    Procedure: TRANSURETHRAL RESECTION OF BLADDER TUMOR WITH GYRUS (TURBT-GYRUS);  Surgeon: Alexis Frock, MD;  Location: [facility];  Service: Urology;  Laterality: N/A;  . Cystoscopy with retrograde pyelogram, ureteroscopy and stent placement Bilateral 11/14/2013    Procedure: CYSTOSCOPY WITH BILATERAL RETROGRADE PYELOGRAM, LEFT URETEROSCOPY WITH BIOPSY  AND STENT EXCHANGE;  Surgeon: Alexis Frock, MD;  Location: [facility];  Service: Urology;  Laterality: Bilateral;  . Implantable cardioverter defibrillator implant N/A 09/16/2011    Procedure: IMPLANTABLE CARDIOVERTER DEFIBRILLATOR IMPLANT;  Surgeon: Evans Lance, MD;  Location: [facility];  Service: Cardiovascular;  Laterality: N/A;  . Cardiac catheterization    . Robot assited laparoscopic nephroureterectomy Left 03/15/2014    Procedure: ROBOT ASSITED LAPAROSCOPIC NEPHROURETERECTOMY;  Surgeon: Alexis Frock, MD;  Location: [facility];  Service: Urology;  Laterality: Left;  . Cystoscopy w/ ureteral stent placement Left 03/15/2014    Procedure: CYSTOSCOPY WITH RETROGRADE PYELOGRAM/URETERAL STENT PLACEMENT;  Surgeon: Alexis Frock, MD;  Location: [facility];  Service: Urology;  Laterality: Left;    Social History   Social History  . Marital Status: Married    Spouse Name: N/A  . Number of Children: N/A  . Years  of Education: N/A   Occupational History  . Not on file.   Social History Main Topics  . Smoking status: Former Smoker -- 0.50 packs/day for 6 years    Types: Cigarettes    Quit date: 09/16/1971  . Smokeless tobacco: Never Used  . Alcohol Use: No  . Drug Use: No  . Sexual Activity: Yes   Other Topics Concern  . Not on file   Social History  Narrative    ROS: cramps but no fevers or chills, productive cough, hemoptysis, dysphasia, odynophagia, melena, hematochezia, dysuria, hematuria, rash, seizure activity, orthopnea, PND, pedal edema, claudication. Remaining systems are negative.  Physical Exam: Well-developed well-nourished in no acute distress.  Skin is warm and dry.  HEENT is normal.  Neck is supple.  Chest is clear to auscultation with normal expansion.  Cardiovascular exam is regular rate and rhythm.  Abdominal exam nontender or distended. No masses palpated. Extremities show no edema. neuro grossly intact  ECG sinus rhythm at a rate of 69. Normal axis. Prior septal infarct with anterior lateral T-wave inversion.

## 2014-11-13 ENCOUNTER — Encounter: Payer: Self-pay | Admitting: Cardiology

## 2014-11-13 ENCOUNTER — Ambulatory Visit (INDEPENDENT_AMBULATORY_CARE_PROVIDER_SITE_OTHER): Payer: Managed Care, Other (non HMO) | Admitting: Cardiology

## 2014-11-13 VITALS — BP 120/86 | HR 69 | Ht 68.0 in | Wt 175.0 lb

## 2014-11-13 DIAGNOSIS — I2510 Atherosclerotic heart disease of native coronary artery without angina pectoris: Secondary | ICD-10-CM

## 2014-11-13 MED ORDER — CARVEDILOL 12.5 MG PO TABS: 12.5000 mg | ORAL_TABLET | Freq: Two times a day (BID) | ORAL | Status: DC

## 2014-11-13 MED ORDER — ASPIRIN EC 81 MG PO TBEC: 81.0000 mg | DELAYED_RELEASE_TABLET | Freq: Every day | ORAL | Status: AC

## 2014-11-13 MED ORDER — ATORVASTATIN CALCIUM 80 MG PO TABS: 80.0000 mg | ORAL_TABLET | Freq: Every day | ORAL | Status: DC

## 2014-11-13 NOTE — Assessment & Plan Note (Signed)
Continue aspirin and statin. 

## 2014-11-13 NOTE — Patient Instructions (Signed)
Your physician wants you to follow-up in: [place] will receive a reminder letter in the mail two months in advance. If you don't receive a letter, please call our office to schedule the follow-up appointment.   INCREASE CARVEDILOL TO 12.5 MG TWICE DAILY= 2 OF THE 6.25 MG TABLETS TWICE DAILY  STOP CRESTOR WHEN FINISHED WITH CURRENT SUPPLY  START ATORVASTATIN 80 MG ONCE DAILY  Your physician recommends that you return for lab work in: [place] ATORVASTATIN= DO NOT EAT PRIOR TO LAB WORK

## 2014-11-13 NOTE — Assessment & Plan Note (Signed)
Blood pressure controlled. Continue present medications. Check potassium and renal function. 

## 2014-11-13 NOTE — Assessment & Plan Note (Signed)
Patient would like to change statins because of expense. Discontinue Crestor. Begin Lipitor 80 mg daily. Check lipids and liver in 4 weeks.

## 2014-11-13 NOTE — Assessment & Plan Note (Signed)
Followed by electrophysiology. 

## 2014-11-13 NOTE — Assessment & Plan Note (Signed)
Continue ACE inhibitor. Increase carvedilol to 12.5 mg twice a day.

## 2014-12-03 ENCOUNTER — Encounter: Payer: Managed Care, Other (non HMO) | Admitting: Internal Medicine

## 2014-12-31 ENCOUNTER — Encounter: Payer: Self-pay | Admitting: Internal Medicine

## 2014-12-31 ENCOUNTER — Other Ambulatory Visit: Payer: Self-pay

## 2014-12-31 ENCOUNTER — Ambulatory Visit (INDEPENDENT_AMBULATORY_CARE_PROVIDER_SITE_OTHER): Payer: Managed Care, Other (non HMO) | Admitting: Internal Medicine

## 2014-12-31 VITALS — BP 120/78 | HR 61 | Ht 68.0 in | Wt 176.2 lb

## 2014-12-31 DIAGNOSIS — I10 Essential (primary) hypertension: Secondary | ICD-10-CM | POA: Diagnosis not present

## 2014-12-31 DIAGNOSIS — Z95810 Presence of automatic (implantable) cardiac defibrillator: Secondary | ICD-10-CM

## 2014-12-31 LAB — CUP PACEART INCLINIC DEVICE CHECK
Battery Remaining Longevity: 73.2 mo
Brady Statistic RV Percent Paced: 0 %
Date Time Interrogation Session: 20161004164731
HIGH POWER IMPEDANCE MEASURED VALUE: 67.5 Ohm
Lead Channel Pacing Threshold Amplitude: 1 V
Lead Channel Pacing Threshold Amplitude: 1 V
Lead Channel Pacing Threshold Pulse Width: 0.5 ms
Lead Channel Sensing Intrinsic Amplitude: 11.7 mV
Lead Channel Setting Pacing Amplitude: 2.5 V
Lead Channel Setting Pacing Pulse Width: 0.5 ms
MDC IDC MSMT LEADCHNL RV IMPEDANCE VALUE: 487.5 Ohm
MDC IDC MSMT LEADCHNL RV PACING THRESHOLD PULSEWIDTH: 0.5 ms
MDC IDC SET LEADCHNL RV SENSING SENSITIVITY: 0.5 mV
MDC IDC SET ZONE DETECTION INTERVAL: 270 ms
MDC IDC SET ZONE DETECTION INTERVAL: 310 ms
Pulse Gen Serial Number: 1033528

## 2014-12-31 NOTE — Progress Notes (Signed)
HPI Mr. Aldape returns today for followup. He is a very pleasant 59 year old man with a history of chronic systolic heart failure, an ischemic cardiomyopathy, status post myocardial infarction, status post ICD implantation. In the interim, he has been stable from an arrhythmia perspective. He denies heart failure symptoms. He denies chest pain, shortness of breath, peripheral edema, or syncope.  He does admit to being fairly sedentary. Allergies  Allergen Reactions  . Plavix [Clopidogrel] Itching and Rash     Current Outpatient Prescriptions  Medication Sig Dispense Refill  . aspirin EC 81 MG tablet Take 1 tablet (81 mg total) by mouth daily. 90 tablet 3  . atorvastatin (LIPITOR) 80 MG tablet Take 1 tablet (80 mg total) by mouth daily. 90 tablet 3  . carvedilol (COREG) 12.5 MG tablet Take 1 tablet (12.5 mg total) by mouth 2 (two) times daily. 180 tablet 3  . ramipril (ALTACE) 5 MG capsule TAKE 1 CAPSULE BY MOUTH TWICE DAILY 180 capsule 2   No current facility-administered medications for this visit.     Past Medical History  Diagnosis Date  . HYPERLIPIDEMIA   . HYPERTENSION   . CAD   . CARDIOMYOPATHY, ISCHEMIC   . CHF   . Left ventricular dysfunction   . 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 Myocardial infarction ([place]) 2003  . Anginal pain ([facility])     NO RECENT ANGINA  . Bladder cancer ([place])   . AICD (automatic cardioverter/defibrillator) present   . Chronic kidney disease     ROS:   All systems reviewed and negative except as noted in the HPI.   Past Surgical History  Procedure Laterality Date  . Pt had ear surgery    . Icd  09/16/2011  . Hernia repair  2010    right inguinal  . Transurethral resection of bladder tumor with gyrus (turbt-gyrus) N/A 10/03/2013    Procedure: TRANSURETHRAL RESECTION OF BLADDER TUMOR WITH GYRUS (TURBT-GYRUS);  Surgeon: Sharyn Creamer, MD;  Location: [facility];  Service: Urology;  Laterality: N/A;  . Cystoscopy w/  ureteral stent placement Left 10/03/2013    Procedure: CYSTOSCOPY WITH RETROGRADE PYELOGRAM/URETERAL STENT PLACEMENT;  Surgeon: Sharyn Creamer, MD;  Location: [facility];  Service: Urology;  Laterality: Left;  . Transurethral resection of bladder tumor with gyrus (turbt-gyrus) N/A 11/14/2013    Procedure: TRANSURETHRAL RESECTION OF BLADDER TUMOR WITH GYRUS (TURBT-GYRUS);  Surgeon: Alexis Frock, MD;  Location: [facility];  Service: Urology;  Laterality: N/A;  . Cystoscopy with retrograde pyelogram, ureteroscopy and stent placement Bilateral 11/14/2013    Procedure: CYSTOSCOPY WITH BILATERAL RETROGRADE PYELOGRAM, LEFT URETEROSCOPY WITH BIOPSY  AND STENT EXCHANGE;  Surgeon: Alexis Frock, MD;  Location: [facility];  Service: Urology;  Laterality: Bilateral;  . Implantable cardioverter defibrillator implant N/A 09/16/2011    Procedure: IMPLANTABLE CARDIOVERTER DEFIBRILLATOR IMPLANT;  Surgeon: Evans Lance, MD;  [facility];  Service: Cardiovascular;  Laterality: N/A;  . Cardiac catheterization    . Robot assited laparoscopic nephroureterectomy Left 03/15/2014    Procedure: ROBOT ASSITED LAPAROSCOPIC NEPHROURETERECTOMY;  Surgeon: Alexis Frock, MD;  Location: [facility];  Service: Urology;  Laterality: Left;  . Cystoscopy w/ ureteral stent placement Left 03/15/2014    Procedure: CYSTOSCOPY WITH RETROGRADE PYELOGRAM/URETERAL STENT PLACEMENT;  Surgeon: Alexis Frock, MD;  Location: [facility];  Service: Urology;  Laterality: Left;     Family History  Problem Relation Age of Onset  . Heart failure    . Cancer Father     prostate  .  Heart Problems Maternal Uncle   . Cancer Paternal Uncle     lung  . Cancer Paternal Grandmother     lung  . Cancer Brother     lung     Social History   Social History  . Marital Status: Married    Spouse Name: N/A  . Number of Children: N/A  . Years of Education: N/A   Occupational History  . Not on file.   Social History Main Topics  . Smoking status:  Former Smoker -- 0.50 packs/day for 6 years    Types: Cigarettes    Quit date: 09/16/1971  . Smokeless tobacco: Never Used  . Alcohol Use: No  . Drug Use: No  . Sexual Activity: Yes   Other Topics Concern  . Not on file   Social History Narrative     BP 120/78 mmHg  Pulse 61  Ht 5\' 8"  (1.727 m)  Wt 176 lb 3.2 oz (79.924 kg)  BMI 26.80 kg/m2  Physical Exam:  Well appearing 59 year old man,NAD HEENT: Unremarkable Neck:  6 cm JVD, no thyromegally Lungs:  Clear with no wheezes, rales, or rhonchi. HEART:  Regular rate rhythm, no murmurs, no rubs, no clicks Abd:  soft, positive bowel sounds, no organomegally, no rebound, no guarding Ext:  2 plus pulses, no edema, no cyanosis, no clubbing Skin:  No rashes no nodules Neuro:  CN II through XII intact, motor grossly intact  DEVICE  Normal device function.  See PaceArt for details.   Assess/Plan:

## 2014-12-31 NOTE — Assessment & Plan Note (Signed)
He denies anginal symptoms. He will continue his current meds and is encouraged to increase his physical activity.

## 2014-12-31 NOTE — Assessment & Plan Note (Signed)
His [employer] device is working normally. No sustained arrhythmias.

## 2014-12-31 NOTE — Assessment & Plan Note (Signed)
His blood pressure is well controlled. He will continue his current meds. 

## 2014-12-31 NOTE — Patient Instructions (Signed)
Medication Instructions:  Your physician recommends that you continue on your current medications as directed. Please refer to the Current Medication list given to you today.   Labwork: None ordered   Testing/Procedures: None ordered   Follow-Up: Your physician wants you to follow-up in: 12 months with Dr Knox Saliva will receive a reminder letter in the mail two months in advance. If you don't receive a letter, please call our office to schedule the follow-up appointment.  Remote monitoring is used to monitor your  ICD from home. This monitoring reduces the number of office visits required to check your device to one time per year. It allows [place] to keep an eye on the functioning of your device to ensure it is working properly. You are scheduled for a device check from home on 04/01/15. You may send your transmission at any time that day. If you have a wireless device, the transmission will be sent automatically. After your physician reviews your transmission, you will receive a postcard with your next transmission date.    Any Other Special Instructions Will Be Listed Below (If Applicable).

## 2015-01-07 ENCOUNTER — Encounter: Payer: Self-pay | Admitting: *Deleted

## 2015-01-07 LAB — BASIC METABOLIC PANEL
BUN: 15 mg/dL (ref 7–25)
CALCIUM: 9.2 mg/dL (ref 8.6–10.3)
CO2: 27 mmol/L (ref 20–31)
CREATININE: 1.21 mg/dL (ref 0.70–1.25)
Chloride: 102 mmol/L (ref 98–110)
Glucose, Bld: 85 mg/dL (ref 65–99)
Potassium: 4.4 mmol/L (ref 3.5–5.3)
Sodium: 138 mmol/L (ref 135–146)

## 2015-01-07 LAB — HEPATIC FUNCTION PANEL
ALBUMIN: 4.1 g/dL (ref 3.6–5.1)
ALK PHOS: 76 U/L (ref 40–115)
ALT: 17 U/L (ref 9–46)
AST: 20 U/L (ref 10–35)
Bilirubin, Direct: 0.2 mg/dL (ref <=0.2)
Indirect Bilirubin: 0.9 mg/dL (ref 0.2–1.2)
TOTAL PROTEIN: 6.9 g/dL (ref 6.1–8.1)
Total Bilirubin: 1.1 mg/dL (ref 0.2–1.2)

## 2015-01-07 LAB — LIPID PANEL
Cholesterol: 127 mg/dL (ref 125–200)
HDL: 42 mg/dL (ref >=40)
LDL Cholesterol: 59 mg/dL (ref <130)
TRIGLYCERIDES: 131 mg/dL (ref <150)
Total CHOL/HDL Ratio: 3 Ratio (ref <=5.0)
VLDL: 26 mg/dL (ref <30)

## 2015-02-12 ENCOUNTER — Telehealth: Payer: Self-pay | Admitting: Family Medicine

## 2015-02-28 ENCOUNTER — Other Ambulatory Visit: Payer: Self-pay | Admitting: Cardiology

## 2015-03-18 ENCOUNTER — Ambulatory Visit (INDEPENDENT_AMBULATORY_CARE_PROVIDER_SITE_OTHER): Payer: Managed Care, Other (non HMO) | Admitting: Family Medicine

## 2015-03-18 ENCOUNTER — Encounter: Payer: Self-pay | Admitting: Family Medicine

## 2015-03-18 VITALS — BP 138/85 | HR 69 | Temp 97.1°F | Ht 68.0 in | Wt 178.6 lb

## 2015-03-18 DIAGNOSIS — R309 Painful micturition, unspecified: Secondary | ICD-10-CM

## 2015-03-18 LAB — POCT UA - MICROSCOPIC ONLY
BACTERIA, U MICROSCOPIC: NEGATIVE
CASTS, UR, LPF, POC: NEGATIVE
Crystals, Ur, HPF, POC: NEGATIVE
Epithelial cells, urine per micros: NEGATIVE
Mucus, UA: NEGATIVE
RBC, URINE, MICROSCOPIC: NEGATIVE
WBC, UR, HPF, POC: NEGATIVE
Yeast, UA: NEGATIVE

## 2015-03-18 LAB — POCT URINALYSIS DIPSTICK
BILIRUBIN UA: NEGATIVE
Blood, UA: NEGATIVE
GLUCOSE UA: NEGATIVE
Ketones, UA: NEGATIVE
Leukocytes, UA: NEGATIVE
NITRITE UA: NEGATIVE
Protein, UA: NEGATIVE
Spec Grav, UA: 1.02
UROBILINOGEN UA: NEGATIVE
pH, UA: 6

## 2015-03-18 NOTE — Addendum Note (Signed)
Addended by: Selmer Dominion on: 03/18/2015 05:07 PM   Modules accepted: Orders

## 2015-03-18 NOTE — Progress Notes (Signed)
   HPI  Patient presents today with painful urination.  Patient explains he's  had 2 weeks of dysuria, urinary frequency, burning with ejaculation, and cloudy urine.  He denies fever, abdominal pain, back pain. He is not had any recent urologic procedures He has a history of bladder cancer and left-sided renal cancer status post nephrectomy.  He states he's had 3 UTIs this year He reports normal stream but taking a longer time than usual to empty his bladder, he also complains of some dribbling afterwards.  PMH: Smoking status noted ROS: Per HPI  Objective: BP 138/85 mmHg  Pulse 69  Temp(Src) 97.1 F (36.2 C) (Oral)  Ht 5\' 8"  (1.727 m)  Wt 178 lb 9.6 oz (81.012 kg)  BMI 27.16 kg/m2 Gen: NAD, alert, cooperative with exam HEENT: NCAT CV: RRR, good S1/S2, no murmur Resp: CTABL, no wheezes, non-labored Abd: SNTND, BS present, no guarding or organomegaly Ext: No edema, warm Neuro: Alert and oriented, No gross deficits  Assessment and plan:  # Dysuria Unclear etiology, no signs of infection on urinalysis We'll send for culture Recommended follow-up with urology next week if symptoms do not improve, seek medical  help right away if he develops fever, abdominal pain, or back pain.   Laroy Apple, MD [facility] 03/18/2015, 3:46 PM

## 2015-03-18 NOTE — Patient Instructions (Signed)
Great to meet you!  If your symptoms persist you will probably need to go see your urologist.   We will send the urine for a culture and call you with results.

## 2015-03-20 ENCOUNTER — Other Ambulatory Visit: Payer: Self-pay | Admitting: Family Medicine

## 2015-03-20 LAB — URINE CULTURE

## 2015-03-20 MED ORDER — CIPROFLOXACIN HCL 250 MG PO TABS: 250.0000 mg | ORAL_TABLET | Freq: Two times a day (BID) | ORAL | Status: DC

## 2015-03-20 NOTE — Telephone Encounter (Signed)
cipro for UTI, given symptoms I will treat even though his CFU's were lower than technical requirements.   Nursing to call.   Laroy Apple, MD [facility] 03/20/2015, 1:57 PM

## 2015-04-01 ENCOUNTER — Telehealth: Payer: Self-pay | Admitting: Cardiology

## 2015-04-01 ENCOUNTER — Ambulatory Visit (INDEPENDENT_AMBULATORY_CARE_PROVIDER_SITE_OTHER): Payer: Managed Care, Other (non HMO) | Admitting: *Deleted

## 2015-04-01 DIAGNOSIS — I429 Cardiomyopathy, unspecified: Secondary | ICD-10-CM

## 2015-04-01 NOTE — Telephone Encounter (Signed)
Attempted to confirm remote transmission with pt. No answer and was unable to leave a message.   

## 2015-04-02 NOTE — Progress Notes (Signed)
Remote ICD transmission.   

## 2015-04-09 ENCOUNTER — Encounter: Payer: Self-pay | Admitting: Cardiology

## 2015-04-09 LAB — CUP PACEART REMOTE DEVICE CHECK
Battery Remaining Percentage: 68 %
Battery Voltage: 2.95 V
Date Time Interrogation Session: 20170103210720
HIGH POWER IMPEDANCE MEASURED VALUE: 70 Ohm
HIGH POWER IMPEDANCE MEASURED VALUE: 70 Ohm
Implantable Lead Implant Date: 20130620
Implantable Lead Serial Number: 316507
Lead Channel Impedance Value: 510 Ohm
Lead Channel Pacing Threshold Pulse Width: 0.5 ms
Lead Channel Sensing Intrinsic Amplitude: 11.7 mV
MDC IDC LEAD LOCATION: 753860
MDC IDC LEAD MODEL: 181
MDC IDC MSMT BATTERY REMAINING LONGEVITY: 71 mo
MDC IDC MSMT LEADCHNL RV PACING THRESHOLD AMPLITUDE: 1 V
MDC IDC PG SERIAL: 1033528
MDC IDC SET LEADCHNL RV PACING AMPLITUDE: 2.5 V
MDC IDC SET LEADCHNL RV PACING PULSEWIDTH: 0.5 ms
MDC IDC SET LEADCHNL RV SENSING SENSITIVITY: 0.5 mV
MDC IDC STAT BRADY RV PERCENT PACED: 1 %

## 2015-07-01 ENCOUNTER — Ambulatory Visit (INDEPENDENT_AMBULATORY_CARE_PROVIDER_SITE_OTHER): Payer: Managed Care, Other (non HMO) | Admitting: *Deleted

## 2015-07-01 ENCOUNTER — Telehealth: Payer: Self-pay | Admitting: Cardiology

## 2015-07-01 DIAGNOSIS — I429 Cardiomyopathy, unspecified: Secondary | ICD-10-CM

## 2015-07-01 NOTE — Progress Notes (Signed)
Remote ICD transmission.   

## 2015-07-01 NOTE — Telephone Encounter (Signed)
LMOVM reminding pt to send remote transmission.   

## 2015-07-02 ENCOUNTER — Encounter (INDEPENDENT_AMBULATORY_CARE_PROVIDER_SITE_OTHER): Payer: Self-pay

## 2015-07-02 ENCOUNTER — Encounter: Payer: Self-pay | Admitting: Family Medicine

## 2015-07-02 ENCOUNTER — Ambulatory Visit (INDEPENDENT_AMBULATORY_CARE_PROVIDER_SITE_OTHER): Payer: Managed Care, Other (non HMO) | Admitting: Family Medicine

## 2015-07-02 VITALS — BP 108/67 | HR 64 | Temp 96.8°F | Ht 68.0 in | Wt 181.0 lb

## 2015-07-02 DIAGNOSIS — R300 Dysuria: Secondary | ICD-10-CM | POA: Diagnosis not present

## 2015-07-02 DIAGNOSIS — N342 Other urethritis: Secondary | ICD-10-CM

## 2015-07-02 DIAGNOSIS — I10 Essential (primary) hypertension: Secondary | ICD-10-CM | POA: Diagnosis not present

## 2015-07-02 DIAGNOSIS — R3915 Urgency of urination: Secondary | ICD-10-CM | POA: Diagnosis not present

## 2015-07-02 LAB — MICROSCOPIC EXAMINATION: Bacteria, UA: NONE SEEN

## 2015-07-02 LAB — URINALYSIS, COMPLETE
Bilirubin, UA: NEGATIVE
Glucose, UA: NEGATIVE
Ketones, UA: NEGATIVE
NITRITE UA: NEGATIVE
Protein, UA: NEGATIVE
Specific Gravity, UA: 1.01 (ref 1.005–1.030)
Urobilinogen, Ur: 0.2 mg/dL (ref 0.2–1.0)
pH, UA: 6 (ref 5.0–7.5)

## 2015-07-02 MED ORDER — CIPROFLOXACIN HCL 500 MG PO TABS: 500.0000 mg | ORAL_TABLET | Freq: Two times a day (BID) | ORAL | Status: DC

## 2015-07-02 NOTE — Progress Notes (Signed)
Subjective:  Patient ID: [name] [name], male    DOB: 1955-12-19  Age: 60 y.o. MRN: [id number]  CC: Urinary Tract Infection   HPI [name] [name] presents for burning with urination and frequency for several days. Denies fever . No flank pain. No nausea, vomiting.    History [name] [name] has a past medical history of HYPERLIPIDEMIA; HYPERTENSION; CAD; CARDIOMYOPATHY, ISCHEMIC; CHF; Left ventricular dysfunction; ICD (implantable cardiac defibrillator) in place (09/16/2011); Myocardial infarction [facility]) (2003); Anginal pain ([place]); Bladder cancer [facility]); AICD (automatic cardioverter/defibrillator) present; and Chronic kidney disease.   He has past surgical history that includes pt had ear surgery; ICD (09/16/2011); Hernia repair (2010); Transurethral resection of bladder tumor with gyrus (turbt-gyrus) (N/A, 10/03/2013); Cystoscopy w/ ureteral stent placement (Left, 10/03/2013); Transurethral resection of bladder tumor with gyrus (turbt-gyrus) (N/A, 11/14/2013); Cystoscopy with retrograde pyelogram, ureteroscopy and stent placement (Bilateral, 11/14/2013); implantable cardioverter defibrillator implant (N/A, 09/16/2011); Cardiac catheterization; Robot assited laparoscopic nephroureterectomy (Left, 03/15/2014); Cystoscopy w/ ureteral stent placement (Left, 03/15/2014); and kidney removed (03/17/2014).   His family history includes Cancer in his brother, father, paternal grandmother, and paternal uncle; Heart Problems in his maternal uncle.He reports that he quit smoking about 43 years ago. His smoking use included Cigarettes. He has a 3 pack-year smoking history. He has never used smokeless tobacco. He reports that he does not drink alcohol or use illicit drugs.    ROS Review of Systems  Constitutional: Negative for fever, chills and diaphoresis.  HENT: Negative for rhinorrhea and sore throat.   Respiratory: Negative for cough and shortness of breath.   Cardiovascular: Negative for chest pain.  Gastrointestinal:  Negative for abdominal pain.  Musculoskeletal: Negative for myalgias and arthralgias.  Skin: Negative for rash.  Neurological: Negative for weakness and headaches.    Objective:  BP 108/67 mmHg  Pulse 64  Temp(Src) 96.8 F (36 C) (Oral)  Ht 5\' 8"  (1.727 m)  Wt 181 lb (82.101 kg)  BMI 27.53 kg/m2  SpO2 96%  BP Readings from Last 3 Encounters:  07/02/15 108/67  03/18/15 138/85  12/31/14 120/78    Wt Readings from Last 3 Encounters:  07/02/15 181 lb (82.101 kg)  03/18/15 178 lb 9.6 oz (81.012 kg)  12/31/14 176 lb 3.2 oz (79.924 kg)     Physical Exam  Constitutional: He appears well-developed and well-nourished.  HENT:  Head: Normocephalic and atraumatic.  Right Ear: Tympanic membrane and external ear normal. No decreased hearing is noted.  Left Ear: Tympanic membrane and external ear normal. No decreased hearing is noted.  Mouth/Throat: No oropharyngeal exudate or posterior oropharyngeal erythema.  Eyes: Pupils are equal, round, and reactive to light.  Neck: Normal range of motion. Neck supple.  Cardiovascular: Normal rate and regular rhythm.   No murmur heard. Pulmonary/Chest: Breath sounds normal. No respiratory distress.  Abdominal: Soft. Bowel sounds are normal. He exhibits no mass. There is no tenderness.  Vitals reviewed.    Lab Results  Component Value Date   WBC 5.2 03/12/2014   HGB 13.5 03/16/2014   HCT 40.7 03/16/2014   PLT 223 03/12/2014   GLUCOSE 85 01/06/2015   CHOL 127 01/06/2015   TRIG 131 01/06/2015   HDL 42 01/06/2015   LDLCALC 59 01/06/2015   ALT 17 01/06/2015   AST 20 01/06/2015   NA 138 01/06/2015   K 4.4 01/06/2015   CL 102 01/06/2015   CREATININE 1.21 01/06/2015   BUN 15 01/06/2015   CO2 27 01/06/2015   INR 0.99 09/16/2011  No results found.  Assessment & Plan:   [name] [name] was seen today for urinary tract infection.  Diagnoses and all orders for this visit:  Dysuria -     Urinalysis, Complete  Urinary urgency -      Urinalysis, Complete  Essential hypertension  Urethritis  Other orders -     ciprofloxacin (CIPRO) 500 MG tablet; Take 1 tablet (500 mg total) by mouth 2 (two) times daily. For prostate. Take all of these.    I have discontinued [name] [name]'s ciprofloxacin. I am also having him start on ciprofloxacin. Additionally, I am having him maintain his carvedilol, atorvastatin, aspirin EC, ramipril, and CIALIS.  Meds ordered this encounter  Medications  . CIALIS 20 MG tablet    Sig:   . ciprofloxacin (CIPRO) 500 MG tablet    Sig: Take 1 tablet (500 mg total) by mouth 2 (two) times daily. For prostate. Take all of these.    Dispense:  30 tablet    Refill:  0     Follow-up: Return if symptoms worsen or fail to improve.  Claretta Fraise, M.D.

## 2015-08-20 ENCOUNTER — Encounter: Payer: Self-pay | Admitting: Cardiology

## 2015-08-20 LAB — CUP PACEART REMOTE DEVICE CHECK
Brady Statistic RV Percent Paced: 1 % — CL
HIGH POWER IMPEDANCE MEASURED VALUE: 70 Ohm
Lead Channel Sensing Intrinsic Amplitude: 11.7 mV
MDC IDC LEAD IMPLANT DT: 20130620
MDC IDC LEAD LOCATION: 753860
MDC IDC LEAD MODEL: 181
MDC IDC LEAD SERIAL: 316507
MDC IDC MSMT BATTERY REMAINING LONGEVITY: 67 mo
MDC IDC MSMT BATTERY REMAINING PERCENTAGE: 66 %
MDC IDC MSMT LEADCHNL RV IMPEDANCE VALUE: 460 Ohm
MDC IDC SESS DTM: 20170524112718
Pulse Gen Serial Number: 1033528

## 2015-09-12 ENCOUNTER — Ambulatory Visit (INDEPENDENT_AMBULATORY_CARE_PROVIDER_SITE_OTHER): Payer: Managed Care, Other (non HMO) | Admitting: Family Medicine

## 2015-09-12 ENCOUNTER — Encounter: Payer: Self-pay | Admitting: Family Medicine

## 2015-09-12 VITALS — BP 121/78 | HR 60 | Temp 97.1°F | Ht 68.0 in | Wt 177.4 lb

## 2015-09-12 DIAGNOSIS — S3092XA Unspecified superficial injury of abdominal wall, initial encounter: Secondary | ICD-10-CM

## 2015-09-12 DIAGNOSIS — R252 Cramp and spasm: Secondary | ICD-10-CM | POA: Diagnosis not present

## 2015-09-12 DIAGNOSIS — W57XXXA Bitten or stung by nonvenomous insect and other nonvenomous arthropods, initial encounter: Secondary | ICD-10-CM | POA: Diagnosis not present

## 2015-09-12 NOTE — Progress Notes (Signed)
BP 121/78 mmHg  Pulse 60  Temp(Src) 97.1 F (36.2 C) (Oral)  Ht _0  (1.727 m)  Wt 177 lb 6.4 oz (80.468 kg)  BMI 26.98 kg/m2   Subjective:    Patient ID: [name] [name], male    DOB: Jun 18, 1955, 60 y.o.   MRN: [id number]  HPI: [name] [name] is a 60 y.o. male presenting on 09/12/2015 for Leg and back pain   HPI Leg cramps and weakness and low back pain Patient is having leg cramps and weakness and low back pain. Patient says the low back pain was a couple days ago the cramping is been going on for a couple weeks. And the weakness is started over the past couple days. He is not having the low back pain anymore. He describes it more as muscle aches similar to after working out and having muscle soreness. The muscle soreness is in bilateral lower extremities. He denies any aches or muscle soreness gone anywhere else in his body. He was also concerned because he had a tick bite a few weeks ago on his lower abdomen and had a little bit of a red rash around it but then has had nothing since until now. He denies any fevers or chills or shortness of breath or wheezing. He denies any headaches or abdominal complaints.he is having more issues walking around because of the aches and weakness. Sometimes he feels like his lower legs give out.  Relevant past medical, surgical, family and social history reviewed and updated as indicated. Interim medical history since our last visit reviewed. Allergies and medications reviewed and updated.  Review of Systems  Constitutional: Negative for fever and chills.  HENT: Negative for ear discharge and ear pain.   Eyes: Negative for discharge and visual disturbance.  Respiratory: Negative for shortness of breath and wheezing.   Cardiovascular: Negative for chest pain, palpitations and leg swelling.  Gastrointestinal: Negative for abdominal pain, diarrhea and constipation.  Genitourinary: Negative for difficulty urinating.  Musculoskeletal: Negative for back  pain and gait problem.  Skin: Negative for color change, rash and wound.  Neurological: Positive for weakness. Negative for dizziness, syncope, light-headedness, numbness and headaches.  All other systems reviewed and are negative.   Per HPI unless specifically indicated above     Medication List       This list is accurate as of: 09/12/15  5:07 PM.  Always use your most recent med list.               aspirin EC 81 MG tablet  Take 1 tablet (81 mg total) by mouth daily.     atorvastatin 80 MG tablet  Commonly known as:  LIPITOR  Take 1 tablet (80 mg total) by mouth daily.     carvedilol 12.5 MG tablet  Commonly known as:  COREG  Take 1 tablet (12.5 mg total) by mouth 2 (two) times daily.     CIALIS 20 MG tablet  Generic drug:  tadalafil     ramipril 5 MG capsule  Commonly known as:  ALTACE  TAKE 1 CAPSULE BY MOUTH TWICE DAILY           Objective:    BP 121/78 mmHg  Pulse 60  Temp(Src) 97.1 F (36.2 C) (Oral)  Ht _1  (1.727 m)  Wt 177 lb 6.4 oz (80.468 kg)  BMI 26.98 kg/m2  Wt Readings from Last 3 Encounters:  09/12/15 177 lb 6.4 oz (80.468 kg)  07/02/15 181 lb (82.101 kg)  03/18/15 178 lb 9.6 oz (81.012 kg)    Physical Exam  Constitutional: He is oriented to person, place, and time. He appears well-developed and well-nourished. No distress.  Eyes: Conjunctivae and EOM are normal. Pupils are equal, round, and reactive to light. Right eye exhibits no discharge. No scleral icterus.  Neck: Neck supple. No thyromegaly present.  Cardiovascular: Normal rate, regular rhythm, normal heart sounds and intact distal pulses.   No murmur heard. Pulmonary/Chest: Effort normal and breath sounds normal. No respiratory distress. He has no wheezes.  Musculoskeletal: Normal range of motion. He exhibits no edema or tenderness (Unable to elicit any tenderness in the legs or in the spine on range of motion or palpation. 4 out of 5 5 in bilateral lower extremities.).    Lymphadenopathy:    He has no cervical adenopathy.  Neurological: He is alert and oriented to person, place, and time. Coordination normal.  Skin: Skin is warm and dry. No rash noted. He is not diaphoretic.  Psychiatric: He has a normal mood and affect. His behavior is normal.  Nursing note and vitals reviewed.     Assessment & Plan:       Problem List Items Addressed This Visit    None    Visit Diagnoses    Muscle cramps    -  Primary    Patient has cramping and achiness and weakness in both lower legs. Consider x-ray in the future, we'll do labs.    Relevant Orders    CMP14+EGFR    TSH    Vitamin B12    Magnesium    Rocky mtn spotted fvr abs pnl(IgG+IgM)    Lyme Ab/Western Blot Reflex    Alpha-Gal Panel    Tick bite        Relevant Orders    Rocky mtn spotted fvr abs pnl(IgG+IgM)    Lyme Ab/Western Blot Reflex    Alpha-Gal Panel        Follow up plan: Return if symptoms worsen or fail to improve.  Counseling provided for all of the vaccine components Orders Placed This Encounter  Procedures  . CMP14+EGFR  . TSH  . Vitamin B12  . Magnesium  . Rocky mtn spotted fvr abs pnl(IgG+IgM)  . Lyme Ab/Western Blot Reflex  . Hatton, MD [facility] 09/12/2015, 5:07 PM

## 2015-09-17 LAB — RMSF, IGG, IFA

## 2015-09-17 LAB — CMP14+EGFR
A/G RATIO: 1.7 (ref 1.2–2.2)
ALBUMIN: 4.2 g/dL (ref 3.6–4.8)
ALK PHOS: 73 IU/L (ref 39–117)
ALT: 24 IU/L (ref 0–44)
AST: 28 IU/L (ref 0–40)
BUN / CREAT RATIO: 13 (ref 10–24)
BUN: 18 mg/dL (ref 8–27)
Bilirubin Total: 0.5 mg/dL (ref 0.0–1.2)
CO2: 19 mmol/L (ref 18–29)
CREATININE: 1.41 mg/dL — AB (ref 0.76–1.27)
Calcium: 8.8 mg/dL (ref 8.6–10.2)
Chloride: 103 mmol/L (ref 96–106)
GFR calc non Af Amer: 53 mL/min/{1.73_m2} — ABNORMAL LOW (ref >59)
GFR, EST AFRICAN AMERICAN: 61 mL/min/{1.73_m2} (ref >59)
GLOBULIN, TOTAL: 2.5 g/dL (ref 1.5–4.5)
Glucose: 86 mg/dL (ref 65–99)
POTASSIUM: 4.2 mmol/L (ref 3.5–5.2)
SODIUM: 140 mmol/L (ref 134–144)
Total Protein: 6.7 g/dL (ref 6.0–8.5)

## 2015-09-17 LAB — ROCKY MTN SPOTTED FVR ABS PNL(IGG+IGM)
RMSF IgG: POSITIVE — AB
RMSF IgM: 0.42 index (ref 0.00–0.89)

## 2015-09-17 LAB — ALPHA-GAL PANEL
ALPHA GAL IGE: 13 kU/L — AB (ref <0.35)
BEEF (BOS SPP) IGE: 1.48 kU/L — AB (ref <0.35)
BEEF CLASS INTERPRETATION: 2
Class Interpretation: 1
LAMB/MUTTON (OVIS SPP) IGE: 0.21 kU/L (ref <0.35)
Pork (Sus spp) IgE: 0.59 kU/L — ABNORMAL HIGH (ref <0.35)

## 2015-09-17 LAB — VITAMIN B12: Vitamin B-12: 151 pg/mL — ABNORMAL LOW (ref 211–946)

## 2015-09-17 LAB — MAGNESIUM: MAGNESIUM: 2.1 mg/dL (ref 1.6–2.3)

## 2015-09-17 LAB — LYME AB/WESTERN BLOT REFLEX

## 2015-09-17 LAB — TSH: TSH: 1.88 u[IU]/mL (ref 0.450–4.500)

## 2015-09-18 ENCOUNTER — Other Ambulatory Visit: Payer: Self-pay | Admitting: Family

## 2015-09-18 DIAGNOSIS — Z91014 Allergy to mammalian meats: Secondary | ICD-10-CM

## 2015-09-18 DIAGNOSIS — Z91018 Allergy to other foods: Secondary | ICD-10-CM

## 2015-09-18 MED ORDER — EPINEPHRINE 0.3 MG/0.3ML IJ SOAJ: 0.3000 mg | Freq: Once | INTRAMUSCULAR | Status: DC

## 2015-10-01 ENCOUNTER — Ambulatory Visit (INDEPENDENT_AMBULATORY_CARE_PROVIDER_SITE_OTHER): Payer: Managed Care, Other (non HMO) | Admitting: *Deleted

## 2015-10-01 ENCOUNTER — Telehealth: Payer: Self-pay | Admitting: Cardiology

## 2015-10-01 ENCOUNTER — Ambulatory Visit (INDEPENDENT_AMBULATORY_CARE_PROVIDER_SITE_OTHER): Payer: Managed Care, Other (non HMO) | Admitting: Cardiology

## 2015-10-01 ENCOUNTER — Encounter: Payer: Self-pay | Admitting: Cardiology

## 2015-10-01 VITALS — BP 107/72 | HR 63 | Ht 68.0 in | Wt 172.8 lb

## 2015-10-01 DIAGNOSIS — I429 Cardiomyopathy, unspecified: Secondary | ICD-10-CM

## 2015-10-01 DIAGNOSIS — E785 Hyperlipidemia, unspecified: Secondary | ICD-10-CM

## 2015-10-01 DIAGNOSIS — I10 Essential (primary) hypertension: Secondary | ICD-10-CM | POA: Diagnosis not present

## 2015-10-01 DIAGNOSIS — Z95810 Presence of automatic (implantable) cardiac defibrillator: Secondary | ICD-10-CM

## 2015-10-01 DIAGNOSIS — I2510 Atherosclerotic heart disease of native coronary artery without angina pectoris: Secondary | ICD-10-CM

## 2015-10-01 NOTE — Progress Notes (Signed)
HPI: FU coronary artery disease. He had an acute anterior infarct in 2003. At that time, he had PCI of his LAD and diagonal. Note, the Lcx and RCA had no disease. Carotid Dopplers in June of 2009 showed normal carotids. Echocardiogram in May of 2013 showed an ejection fraction of 30-35%. There was mild mitral regurgitation and trace aortic insufficiency. There was mild left atrial enlargement. Patient had ICD placed in June of 2013. Nuclear study June 2015 showed an ejection fraction of 33%. There was scar in the LAD territory with minimal peri-infarct ischemia. Since last seen, the patient has dyspnea with more extreme activities but not with routine activities. It is relieved with rest. It is not associated with chest pain. There is no orthopnea, PND or pedal edema. There is no syncope or palpitations. There is no exertional chest pain.   Current Outpatient Prescriptions  Medication Sig Dispense Refill  . aspirin EC 81 MG tablet Take 1 tablet (81 mg total) by mouth daily. 90 tablet 3  . atorvastatin (LIPITOR) 80 MG tablet Take 1 tablet (80 mg total) by mouth daily. 90 tablet 3  . carvedilol (COREG) 12.5 MG tablet Take 1 tablet (12.5 mg total) by mouth 2 (two) times daily. 180 tablet 3  . CIALIS 20 MG tablet     . ramipril (ALTACE) 5 MG capsule TAKE 1 CAPSULE BY MOUTH TWICE DAILY 180 capsule 2   No current facility-administered medications for this visit.     Past Medical History  Diagnosis Date  . HYPERLIPIDEMIA   . HYPERTENSION   . CAD   . CARDIOMYOPATHY, ISCHEMIC   . CHF   . Left ventricular dysfunction   . 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 Myocardial infarction ([place]) 2003  . Anginal pain ([facility])     NO RECENT ANGINA  . Bladder cancer ([place])   . AICD (automatic cardioverter/defibrillator) present   . Chronic kidney disease     Past Surgical History  Procedure Laterality Date  . Pt had ear surgery    . Icd  09/16/2011  . Hernia repair  2010   right inguinal  . Transurethral resection of bladder tumor with gyrus (turbt-gyrus) N/A 10/03/2013    Procedure: TRANSURETHRAL RESECTION OF BLADDER TUMOR WITH GYRUS (TURBT-GYRUS);  Surgeon: Sharyn Creamer, MD;  Location: [facility];  Service: Urology;  Laterality: N/A;  . Cystoscopy w/ ureteral stent placement Left 10/03/2013    Procedure: CYSTOSCOPY WITH RETROGRADE PYELOGRAM/URETERAL STENT PLACEMENT;  Surgeon: Sharyn Creamer, MD;  Location: [facility];  Service: Urology;  Laterality: Left;  . Transurethral resection of bladder tumor with gyrus (turbt-gyrus) N/A 11/14/2013    Procedure: TRANSURETHRAL RESECTION OF BLADDER TUMOR WITH GYRUS (TURBT-GYRUS);  Surgeon: Alexis Frock, MD;  Location: [facility];  Service: Urology;  Laterality: N/A;  . Cystoscopy with retrograde pyelogram, ureteroscopy and stent placement Bilateral 11/14/2013    Procedure: CYSTOSCOPY WITH BILATERAL RETROGRADE PYELOGRAM, LEFT URETEROSCOPY WITH BIOPSY  AND STENT EXCHANGE;  Surgeon: Alexis Frock, MD;  Location: [facility];  Service: Urology;  Laterality: Bilateral;  . Implantable cardioverter defibrillator implant N/A 09/16/2011    Procedure: IMPLANTABLE CARDIOVERTER DEFIBRILLATOR IMPLANT;  Surgeon: Evans Lance, MD;  Location: [facility];  Service: Cardiovascular;  Laterality: N/A;  . Cardiac catheterization    . Robot assited laparoscopic nephroureterectomy Left 03/15/2014    Procedure: ROBOT ASSITED LAPAROSCOPIC NEPHROURETERECTOMY;  Surgeon: Alexis Frock, MD;  Location: [facility];  Service: Urology;  Laterality: Left;  . Cystoscopy  w/ ureteral stent placement Left 03/15/2014    Procedure: CYSTOSCOPY WITH RETROGRADE PYELOGRAM/URETERAL STENT PLACEMENT;  Surgeon: Alexis Frock, MD;  Location: [facility];  Service: Urology;  Laterality: Left;  . Kidney removed  03/17/2014    Social History   Social History  . Marital Status: Married    Spouse Name: N/A  . Number of Children: N/A  . Years of Education: N/A   Occupational History  .  Not on file.   Social History Main Topics  . Smoking status: Former Smoker -- 0.50 packs/day for 6 years    Types: Cigarettes    Quit date: 09/16/1971  . Smokeless tobacco: Never Used  . Alcohol Use: No  . Drug Use: No  . Sexual Activity: Yes   Other Topics Concern  . Not on file   Social History Narrative    Family History  Problem Relation Age of Onset  . Heart failure    . Cancer Father     prostate  . Heart Problems Maternal Uncle   . Cancer Paternal Uncle     lung  . Cancer Paternal Grandmother     lung  . Cancer Brother     lung    ROS: no fevers or chills, productive cough, hemoptysis, dysphasia, odynophagia, melena, hematochezia, dysuria, hematuria, rash, seizure activity, orthopnea, PND, pedal edema, claudication. Remaining systems are negative.  Physical Exam: Well-developed well-nourished in no acute distress.  Skin is warm and dry.  HEENT is normal.  Neck is supple.  Chest is clear to auscultation with normal expansion.  Cardiovascular exam is regular rate and rhythm.  Abdominal exam nontender or distended. No masses palpated. Extremities show no edema. neuro grossly intact  ECG Normal sinus rhythm at a rate of 62. Normal axis. T-wave inversion in the anterior septal leads. No change compared to previous.  Assessment and plan 1 hyperlipidemia-continue statin. Check lipids. Recent liver function is normal. 2 hypertension-blood pressure controlled. Continue present medications. 3 Coronary artery disease-continue aspirin and statin. 4 Ischemic cardiomyopathy-continue ACE inhibitor and beta blocker. 6 ICD-followed by electrophysiology.  Kirk Ruths, MD

## 2015-10-01 NOTE — Patient Instructions (Signed)
Medication Instructions:   NO CHANGE  Labwork:  Your physician recommends that you return for lab work WHEN FASTING  Follow-Up:  Your physician wants you to follow-up in: ONE YEAR WITH DR CRENSHAW You will receive a reminder letter in the mail two months in advance. If you don't receive a letter, please call our office to schedule the follow-up appointment.   If you need a refill on your cardiac medications before your next appointment, please call your pharmacy.    

## 2015-10-01 NOTE — Telephone Encounter (Signed)
LMOVM reminding pt to send remote transmission.   

## 2015-10-02 NOTE — Progress Notes (Signed)
Remote ICD transmission.   

## 2015-10-04 LAB — LIPID PANEL
CHOLESTEROL: 144 mg/dL (ref 125–200)
HDL: 45 mg/dL (ref >=40)
LDL Cholesterol: 77 mg/dL (ref <130)
TRIGLYCERIDES: 112 mg/dL (ref <150)
Total CHOL/HDL Ratio: 3.2 Ratio (ref <=5.0)
VLDL: 22 mg/dL (ref <30)

## 2015-10-07 NOTE — Addendum Note (Signed)
Addended by: Cristopher Estimable on: 10/07/2015 08:15 AM   Modules accepted: Orders

## 2015-10-08 ENCOUNTER — Encounter: Payer: Self-pay | Admitting: Cardiology

## 2015-10-08 LAB — CUP PACEART REMOTE DEVICE CHECK
Battery Remaining Longevity: 66 mo
Battery Remaining Percentage: 64 %
Brady Statistic RV Percent Paced: 1 % — CL
HighPow Impedance: 72 Ohm
Implantable Lead Implant Date: 20130620
Implantable Lead Location: 753860
Implantable Lead Serial Number: 316507
Lead Channel Impedance Value: 480 Ohm
Lead Channel Setting Pacing Amplitude: 2.5 V
Lead Channel Setting Sensing Sensitivity: 0.5 mV
MDC IDC LEAD MODEL: 181
MDC IDC MSMT LEADCHNL RV SENSING INTR AMPL: 11.7 mV
MDC IDC PG SERIAL: 1033528
MDC IDC SESS DTM: 20170712115547
MDC IDC SET LEADCHNL RV PACING PULSEWIDTH: 0.5 ms

## 2015-10-29 ENCOUNTER — Other Ambulatory Visit: Payer: Self-pay

## 2015-10-29 DIAGNOSIS — I2510 Atherosclerotic heart disease of native coronary artery without angina pectoris: Secondary | ICD-10-CM

## 2015-10-29 MED ORDER — ATORVASTATIN CALCIUM 80 MG PO TABS: 80.0000 mg | ORAL_TABLET | Freq: Every day | ORAL | 3 refills | Status: DC

## 2015-10-29 NOTE — Telephone Encounter (Signed)
Rx(s) sent to pharmacy electronically.  

## 2016-01-07 ENCOUNTER — Other Ambulatory Visit: Payer: Self-pay

## 2016-01-07 ENCOUNTER — Other Ambulatory Visit: Payer: Self-pay | Admitting: *Deleted

## 2016-01-07 DIAGNOSIS — I2510 Atherosclerotic heart disease of native coronary artery without angina pectoris: Secondary | ICD-10-CM

## 2016-01-07 MED ORDER — RAMIPRIL 5 MG PO CAPS
5.0000 mg | ORAL_CAPSULE | Freq: Two times a day (BID) | ORAL | 2 refills | Status: DC
Start: 2016-01-07 — End: 2016-09-26

## 2016-01-07 MED ORDER — CARVEDILOL 12.5 MG PO TABS: 12.5000 mg | ORAL_TABLET | Freq: Two times a day (BID) | ORAL | 2 refills | Status: DC

## 2016-05-31 ENCOUNTER — Encounter: Payer: Managed Care, Other (non HMO) | Admitting: Family Medicine

## 2016-06-08 ENCOUNTER — Encounter: Payer: Self-pay | Admitting: Family Medicine

## 2016-06-08 ENCOUNTER — Ambulatory Visit (INDEPENDENT_AMBULATORY_CARE_PROVIDER_SITE_OTHER): Payer: Managed Care, Other (non HMO) | Admitting: Family Medicine

## 2016-06-08 VITALS — BP 108/64 | HR 54 | Temp 97.3°F | Ht 68.0 in

## 2016-06-08 DIAGNOSIS — Z23 Encounter for immunization: Secondary | ICD-10-CM | POA: Diagnosis not present

## 2016-06-08 DIAGNOSIS — Z0000 Encounter for general adult medical examination without abnormal findings: Secondary | ICD-10-CM

## 2016-06-08 DIAGNOSIS — Z95810 Presence of automatic (implantable) cardiac defibrillator: Secondary | ICD-10-CM

## 2016-06-08 DIAGNOSIS — I2510 Atherosclerotic heart disease of native coronary artery without angina pectoris: Secondary | ICD-10-CM | POA: Diagnosis not present

## 2016-06-08 DIAGNOSIS — I509 Heart failure, unspecified: Secondary | ICD-10-CM

## 2016-06-08 DIAGNOSIS — Z1211 Encounter for screening for malignant neoplasm of colon: Secondary | ICD-10-CM

## 2016-06-08 DIAGNOSIS — R29898 Other symptoms and signs involving the musculoskeletal system: Secondary | ICD-10-CM | POA: Diagnosis not present

## 2016-06-08 DIAGNOSIS — E782 Mixed hyperlipidemia: Secondary | ICD-10-CM

## 2016-06-08 DIAGNOSIS — I10 Essential (primary) hypertension: Secondary | ICD-10-CM

## 2016-06-08 NOTE — Progress Notes (Signed)
Subjective:  Patient ID: [name] [name], male    DOB: [birth year]  Age: 61 y.o. MRN: [id number]  CC: Annual Exam (pt here today for CPE and also has some weakness in both legs.)   HPI [name] [name] presents for Weakness in both legs. This is been increasing over the last 3-4 months. He's had it for some time prior to that as well. He does not have any numbness or burning sensation. However there is a cramping when he exerts himself. Sometimes he has to catch himself because the legs will give way. Of note is that he does have a history of an implantable defibrillator in place now for almost 5 years    follow-up of hypertension. Patient has no recent history of headache chest pain or shortness of breath or recent cough. Patient also denies symptoms of TIA such as focal numbness or weakness. Patient does not check blood pressure at home . Patient denies side effects from his medication. States taking it regularly.   Patient has not experienced recent firing of his defibrillator. He is followed by cardiology of course. She does have a baseline of weakness of the legs as noted above. However, the legs do not swell as would be expected with congestive heart failure nor does he have the overall loss of energy that would be expected. He is not staying active. That is primarily because he cannot walk any length of distance without his legs giving way.  Patient in for follow-up of elevated cholesterol. Doing well without complaints on current medication. Denies side effects of statin including myalgia and arthralgia and nausea. Also in today for liver function testing. Currently no chest pain, shortness of breath or other cardiovascular related symptoms noted.  History [name] [name] has a past medical history of AICD (automatic cardioverter/defibrillator) present; Anginal pain ([place]); Bladder cancer ([place]); CAD; CARDIOMYOPATHY, ISCHEMIC; CHF; Chronic kidney disease; HYPERLIPIDEMIA; HYPERTENSION; ICD (implantable  cardiac defibrillator) in place (09/16/2011); Left ventricular dysfunction; and Myocardial infarction (2003).   He has a past surgical history that includes pt had ear surgery; ICD (09/16/2011); Hernia repair (2010); Transurethral resection of bladder tumor with gyrus (turbt-gyrus) (N/A, 10/03/2013); Cystoscopy w/ ureteral stent placement (Left, 10/03/2013); Transurethral resection of bladder tumor with gyrus (turbt-gyrus) (N/A, 11/14/2013); Cystoscopy with retrograde pyelogram, ureteroscopy and stent placement (Bilateral, 11/14/2013); implantable cardioverter defibrillator implant (N/A, 09/16/2011); Cardiac catheterization; Robot assited laparoscopic nephroureterectomy (Left, 03/15/2014); Cystoscopy w/ ureteral stent placement (Left, 03/15/2014); and kidney removed (03/17/2014).   His family history includes Cancer in his brother, father, paternal grandmother, and paternal uncle; Heart Problems in his maternal uncle.He reports that he quit smoking about 44 years ago. His smoking use included Cigarettes. He has a 3.00 pack-year smoking history. He has never used smokeless tobacco. He reports that he does not drink alcohol or use drugs.    ROS Review of Systems  Constitutional: Negative for activity change, appetite change, chills, diaphoresis, fatigue, fever and unexpected weight change.  HENT: Negative for congestion, ear pain, hearing loss, postnasal drip, rhinorrhea, sore throat, tinnitus and trouble swallowing.   Eyes: Negative for photophobia, pain, discharge and redness.  Respiratory: Negative for apnea, cough, choking, chest tightness, shortness of breath, wheezing and stridor.   Cardiovascular: Negative for chest pain, palpitations and leg swelling.  Gastrointestinal: Negative for abdominal distention, abdominal pain, blood in stool, constipation, diarrhea, nausea and vomiting.  Endocrine: Negative for cold intolerance, heat intolerance, polydipsia, polyphagia and polyuria.  Genitourinary: Negative  for difficulty urinating, dysuria, enuresis, flank pain, frequency, genital  sores, hematuria and urgency.  Musculoskeletal: Negative for arthralgias and joint swelling.  Skin: Negative for color change, rash and wound.  Allergic/Immunologic: Negative for immunocompromised state.  Neurological: Positive for weakness (both legs, symmetric. Increasing over last 3 mos. Has been present long-term.). Negative for dizziness, tremors, seizures, syncope, facial asymmetry, speech difficulty, light-headedness, numbness and headaches.  Hematological: Does not bruise/bleed easily.  Psychiatric/Behavioral: Negative for agitation, behavioral problems, confusion, decreased concentration, dysphoric mood, hallucinations, sleep disturbance and suicidal ideas. The patient is not nervous/anxious and is not hyperactive.     Objective:  BP 108/64   Pulse (!) 54   Temp 97.3 F (36.3 C) (Oral)   Ht '5\' 8"'  (1.727 m)   BP Readings from Last 3 Encounters:  06/08/16 108/64  10/01/15 107/72  09/12/15 121/78    Wt Readings from Last 3 Encounters:  10/01/15 172 lb 12.8 oz (78.4 kg)  09/12/15 177 lb 6.4 oz (80.5 kg)  07/02/15 181 lb (82.1 kg)     Physical Exam  Constitutional: He is oriented to person, place, and time. He appears well-developed and well-nourished.  HENT:  Head: Normocephalic and atraumatic.  Mouth/Throat: Oropharynx is clear and moist.  Eyes: EOM are normal. Pupils are equal, round, and reactive to light.  Neck: Normal range of motion. No tracheal deviation present. No thyromegaly present.  Cardiovascular: Normal rate, regular rhythm and normal heart sounds.  Exam reveals no gallop and no friction rub.   No murmur heard. Pulmonary/Chest: Breath sounds normal. He has no wheezes. He has no rales.  Abdominal: Soft. He exhibits no mass. There is no tenderness.  Musculoskeletal: Normal range of motion. He exhibits no edema, tenderness or deformity.  Some muscle wasting of the quads and adductors  of both thighs. FROM. Somewhat sore to touch  Neurological: He is alert and oriented to person, place, and time.  Skin: Skin is warm and dry.  Psychiatric: He has a normal mood and affect.      Assessment & Plan:   [name] [name] was seen today for annual exam.  Diagnoses and all orders for this visit:  Essential hypertension -     CBC with Differential/Platelet -     CMP14+EGFR -     Urinalysis  Well adult exam -     CBC with Differential/Platelet -     CMP14+EGFR -     PSA Total (Reflex To Free) -     Hepatitis C antibody -     HIV antibody  Atherosclerosis of native coronary artery of native heart without angina pectoris -     CBC with Differential/Platelet -     CMP14+EGFR -     EKG 12-Lead  Automatic implantable cardioverter-defibrillator in situ -     CBC with Differential/Platelet -     CMP14+EGFR -     EKG 12-Lead  Congestive heart failure, unspecified congestive heart failure chronicity, unspecified congestive heart failure type (HCC) -     CBC with Differential/Platelet -     CMP14+EGFR -     EKG 12-Lead  Mixed hyperlipidemia -     CBC with Differential/Platelet -     CMP14+EGFR -     Lipid panel  Screen for colon cancer -     Ambulatory referral to Gastroenterology  Weakness of both lower extremities -     Nerve conduction test; Future  Other orders -     Pneumococcal polysaccharide vaccine 23-valent greater than or equal to 2yo subcutaneous/IM -     Tdap vaccine  greater than or equal to 7yo IM       I am having [name] [name] maintain his aspirin EC, CIALIS, atorvastatin, carvedilol, and ramipril.  Allergies as of 06/08/2016      Reactions   Plavix [clopidogrel] Itching, Rash      Medication List       Accurate as of 06/08/16 11:59 PM. Always use your most recent med list.          aspirin EC 81 MG tablet Take 1 tablet (81 mg total) by mouth daily.   atorvastatin 80 MG tablet Commonly known as:  LIPITOR Take 1 tablet (80 mg total) by mouth  daily.   carvedilol 12.5 MG tablet Commonly known as:  COREG Take 1 tablet (12.5 mg total) by mouth 2 (two) times daily.   CIALIS 20 MG tablet Generic drug:  tadalafil   ramipril 5 MG capsule Commonly known as:  ALTACE Take 1 capsule (5 mg total) by mouth 2 (two) times daily.      EKG read. Report attached. No acute change.  Follow-up: Return in about 1 month (around 07/09/2016).  Claretta Fraise, M.D.

## 2016-06-10 ENCOUNTER — Other Ambulatory Visit (INDEPENDENT_AMBULATORY_CARE_PROVIDER_SITE_OTHER): Payer: Managed Care, Other (non HMO)

## 2016-06-10 LAB — URINALYSIS
Bilirubin, UA: NEGATIVE
GLUCOSE, UA: NEGATIVE
LEUKOCYTES UA: NEGATIVE
Nitrite, UA: NEGATIVE
PROTEIN UA: NEGATIVE
RBC, UA: NEGATIVE
Specific Gravity, UA: >1.03 — ABNORMAL HIGH (ref 1.005–1.030)
UUROB: 0.2 mg/dL (ref 0.2–1.0)
pH, UA: 6 (ref 5.0–7.5)

## 2016-06-11 LAB — CMP14+EGFR
ALBUMIN: 4.1 g/dL (ref 3.6–4.8)
ALK PHOS: 82 IU/L (ref 39–117)
ALT: 37 IU/L (ref 0–44)
AST: 35 IU/L (ref 0–40)
Albumin/Globulin Ratio: 1.5 (ref 1.2–2.2)
BILIRUBIN TOTAL: 1 mg/dL (ref 0.0–1.2)
BUN / CREAT RATIO: 15 (ref 10–24)
BUN: 16 mg/dL (ref 8–27)
CHLORIDE: 104 mmol/L (ref 96–106)
CO2: 24 mmol/L (ref 18–29)
Calcium: 8.9 mg/dL (ref 8.6–10.2)
Creatinine, Ser: 1.07 mg/dL (ref 0.76–1.27)
GFR calc non Af Amer: 74 mL/min/{1.73_m2} (ref >59)
GFR, EST AFRICAN AMERICAN: 86 mL/min/{1.73_m2} (ref >59)
GLOBULIN, TOTAL: 2.8 g/dL (ref 1.5–4.5)
Glucose: 86 mg/dL (ref 65–99)
Potassium: 3.8 mmol/L (ref 3.5–5.2)
SODIUM: 142 mmol/L (ref 134–144)
Total Protein: 6.9 g/dL (ref 6.0–8.5)

## 2016-06-11 LAB — LIPID PANEL
CHOLESTEROL TOTAL: 130 mg/dL (ref 100–199)
Chol/HDL Ratio: 3 ratio units (ref 0.0–5.0)
HDL: 44 mg/dL (ref >39)
LDL Calculated: 64 mg/dL (ref 0–99)
Triglycerides: 108 mg/dL (ref 0–149)
VLDL Cholesterol Cal: 22 mg/dL (ref 5–40)

## 2016-06-11 LAB — CBC WITH DIFFERENTIAL/PLATELET
BASOS ABS: 0 10*3/uL (ref 0.0–0.2)
Basos: 0 %
EOS (ABSOLUTE): 0.3 10*3/uL (ref 0.0–0.4)
EOS: 5 %
HEMATOCRIT: 37.5 % (ref 37.5–51.0)
HEMOGLOBIN: 12.4 g/dL — AB (ref 13.0–17.7)
Immature Grans (Abs): 0 10*3/uL (ref 0.0–0.1)
Immature Granulocytes: 0 %
LYMPHS ABS: 1.1 10*3/uL (ref 0.7–3.1)
Lymphs: 22 %
MCH: 30.3 pg (ref 26.6–33.0)
MCHC: 33.1 g/dL (ref 31.5–35.7)
MCV: 92 fL (ref 79–97)
MONOCYTES: 9 %
MONOS ABS: 0.5 10*3/uL (ref 0.1–0.9)
NEUTROS ABS: 3.1 10*3/uL (ref 1.4–7.0)
Neutrophils: 64 %
Platelets: 214 10*3/uL (ref 150–379)
RBC: 4.09 x10E6/uL — ABNORMAL LOW (ref 4.14–5.80)
RDW: 14.3 % (ref 12.3–15.4)
WBC: 4.9 10*3/uL (ref 3.4–10.8)

## 2016-06-11 LAB — PSA TOTAL (REFLEX TO FREE): PROSTATE SPECIFIC AG, SERUM: 2.4 ng/mL (ref 0.0–4.0)

## 2016-06-11 LAB — HEPATITIS C ANTIBODY

## 2016-06-11 LAB — HIV ANTIBODY (ROUTINE TESTING W REFLEX): HIV SCREEN 4TH GENERATION: NONREACTIVE

## 2016-06-11 NOTE — Progress Notes (Signed)
Patient aware.

## 2016-07-09 ENCOUNTER — Encounter: Payer: Self-pay | Admitting: Cardiology

## 2016-07-15 ENCOUNTER — Ambulatory Visit (INDEPENDENT_AMBULATORY_CARE_PROVIDER_SITE_OTHER): Payer: Managed Care, Other (non HMO) | Admitting: *Deleted

## 2016-07-15 DIAGNOSIS — I429 Cardiomyopathy, unspecified: Secondary | ICD-10-CM | POA: Diagnosis not present

## 2016-07-16 ENCOUNTER — Encounter: Payer: Self-pay | Admitting: Cardiology

## 2016-07-16 LAB — CUP PACEART REMOTE DEVICE CHECK
Date Time Interrogation Session: 20180420113713
HIGH POWER IMPEDANCE MEASURED VALUE: 62 Ohm
Implantable Lead Implant Date: 20130620
Implantable Lead Location: 753860
Implantable Lead Model: 181
Implantable Lead Serial Number: 316507
Lead Channel Pacing Threshold Pulse Width: 0.5 ms
MDC IDC MSMT LEADCHNL RV IMPEDANCE VALUE: 440 Ohm
MDC IDC MSMT LEADCHNL RV PACING THRESHOLD AMPLITUDE: 1 V
MDC IDC MSMT LEADCHNL RV SENSING INTR AMPL: 11.7 mV
MDC IDC PG IMPLANT DT: 20130620
MDC IDC STAT BRADY RV PERCENT PACED: 1 % — AB
Pulse Gen Serial Number: 1033528

## 2016-07-16 NOTE — Progress Notes (Signed)
Remote ICD transmission.   

## 2016-07-20 ENCOUNTER — Encounter: Payer: Self-pay | Admitting: Internal Medicine

## 2016-09-26 ENCOUNTER — Other Ambulatory Visit: Payer: Self-pay | Admitting: Cardiology

## 2016-09-26 DIAGNOSIS — I2510 Atherosclerotic heart disease of native coronary artery without angina pectoris: Secondary | ICD-10-CM

## 2016-10-14 ENCOUNTER — Telehealth: Payer: Self-pay | Admitting: Cardiology

## 2016-10-14 ENCOUNTER — Ambulatory Visit (INDEPENDENT_AMBULATORY_CARE_PROVIDER_SITE_OTHER): Payer: Managed Care, Other (non HMO) | Admitting: *Deleted

## 2016-10-14 DIAGNOSIS — I429 Cardiomyopathy, unspecified: Secondary | ICD-10-CM | POA: Diagnosis not present

## 2016-10-14 NOTE — Telephone Encounter (Signed)
LMOVM reminding pt to send remote transmission.   

## 2016-10-19 NOTE — Progress Notes (Signed)
Remote ICD transmission.   

## 2016-10-21 ENCOUNTER — Encounter: Payer: Self-pay | Admitting: Cardiology

## 2016-10-26 LAB — CUP PACEART REMOTE DEVICE CHECK
Battery Remaining Longevity: 59 mo
Battery Remaining Percentage: 56 %
Brady Statistic RV Percent Paced: 1 %
Date Time Interrogation Session: 20180721163736
HIGH POWER IMPEDANCE MEASURED VALUE: 65 Ohm
HighPow Impedance: 65 Ohm
Implantable Lead Implant Date: 20130620
Implantable Lead Model: 181
Implantable Lead Serial Number: 316507
Lead Channel Impedance Value: 460 Ohm
Lead Channel Pacing Threshold Amplitude: 1 V
Lead Channel Pacing Threshold Pulse Width: 0.5 ms
Lead Channel Setting Pacing Amplitude: 2.5 V
Lead Channel Setting Sensing Sensitivity: 0.5 mV
MDC IDC LEAD LOCATION: 753860
MDC IDC MSMT BATTERY VOLTAGE: 2.92 V
MDC IDC MSMT LEADCHNL RV SENSING INTR AMPL: 10.7 mV
MDC IDC PG IMPLANT DT: 20130620
MDC IDC PG SERIAL: 1033528
MDC IDC SET LEADCHNL RV PACING PULSEWIDTH: 0.5 ms

## 2016-12-01 ENCOUNTER — Encounter: Payer: Self-pay | Admitting: Internal Medicine

## 2016-12-02 NOTE — Progress Notes (Signed)
HPI: FU coronary artery disease. He had an acute anterior infarct in 2003. At that time, he had PCI of his LAD and diagonal. Note, the Lcx and RCA had no disease. Carotid Dopplers in June of 2009 showed normal carotids. Echocardiogram in May of 2013 showed an ejection fraction of 30-35%. There was mild mitral regurgitation and trace aortic insufficiency. There was mild left atrial enlargement. Patient had ICD placed in June of 2013. Nuclear study June 2015 showed an ejection fraction of 33%. There was scar in the LAD territory with minimal peri-infarct ischemia. Since last seen, he has had no chest pain, dyspnea, palpitations or syncope.   Current Outpatient Prescriptions  Medication Sig Dispense Refill  . aspirin EC 81 MG tablet Take 1 tablet (81 mg total) by mouth daily. 90 tablet 3  . atorvastatin (LIPITOR) 80 MG tablet TAKE 1 TABLET (80 MG TOTAL) BY MOUTH DAILY. 30 tablet 0  . carvedilol (COREG) 12.5 MG tablet TAKE 1 TABLET (12.5 MG TOTAL) BY MOUTH 2 (TWO) TIMES DAILY. 60 tablet 0  . CIALIS 20 MG tablet     . ramipril (ALTACE) 5 MG capsule TAKE 1 CAPSULE (5 MG TOTAL) BY MOUTH 2 (TWO) TIMES DAILY. 60 capsule 0   No current facility-administered medications for this visit.      Past Medical History:  Diagnosis Date  . AICD (automatic cardioverter/defibrillator) present   . Anginal pain (HCC)    NO RECENT ANGINA  . Bladder cancer ([place])   . CAD   . CARDIOMYOPATHY, ISCHEMIC   . CHF   . Chronic kidney disease   . HYPERLIPIDEMIA   . HYPERTENSION   . 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 Left ventricular dysfunction   . Myocardial infarction [facility]) 2003    Past Surgical History:  Procedure Laterality Date  . CARDIAC CATHETERIZATION    . CYSTOSCOPY W/ URETERAL STENT PLACEMENT Left 10/03/2013   Procedure: CYSTOSCOPY WITH RETROGRADE PYELOGRAM/URETERAL STENT PLACEMENT;  Surgeon: Sharyn Creamer, MD;  Location: [facility];  Service: Urology;  Laterality: Left;    . CYSTOSCOPY W/ URETERAL STENT PLACEMENT Left 03/15/2014   Procedure: CYSTOSCOPY WITH RETROGRADE PYELOGRAM/URETERAL STENT PLACEMENT;  Surgeon: Alexis Frock, MD;  Location: [facility];  Service: Urology;  Laterality: Left;  . CYSTOSCOPY WITH RETROGRADE PYELOGRAM, URETEROSCOPY AND STENT PLACEMENT Bilateral 11/14/2013   Procedure: CYSTOSCOPY WITH BILATERAL RETROGRADE PYELOGRAM, LEFT URETEROSCOPY WITH BIOPSY  AND STENT EXCHANGE;  Surgeon: Alexis Frock, MD;  Location: [facility];  Service: Urology;  Laterality: Bilateral;  . HERNIA REPAIR  2010   right inguinal  . ICD  09/16/2011  . IMPLANTABLE CARDIOVERTER DEFIBRILLATOR IMPLANT N/A 09/16/2011   Procedure: IMPLANTABLE CARDIOVERTER DEFIBRILLATOR IMPLANT;  Surgeon: Evans Lance, MD;  [facility] C[facility] LAB;  Service: Cardiovascular;  Laterality: N/A;  . kidney removed  03/17/2014  . pt had ear surgery    . ROBOT ASSITED LAPAROSCOPIC NEPHROURETERECTOMY Left 03/15/2014   Procedure: ROBOT ASSITED LAPAROSCOPIC NEPHROURETERECTOMY;  Surgeon: Alexis Frock, MD;  [facility]: [facility];  Service: Urology;  Laterality: Left;  . TRANSURETHRAL RESECTION OF BLADDER TUMOR WITH GYRUS (TURBT-GYRUS) N/A 10/03/2013   Procedure: TRANSURETHRAL RESECTION OF BLADDER TUMOR WITH GYRUS (TURBT-GYRUS);  Surgeon: Sharyn Creamer, MD;  [facility];  Service: Urology;  Laterality: N/A;  . TRANSURETHRAL RESECTION OF BLADDER TUMOR WITH GYRUS (TURBT-GYRUS) N/A 11/14/2013   Procedure: TRANSURETHRAL RESECTION OF BLADDER TUMOR WITH GYRUS (TURBT-GYRUS);  Surgeon: Alexis Frock, MD;  Location[facility];  Service: Urology;  Laterality: N/A;    Social History   Social History  . Marital status: Married    Spouse name: N/A  . Number of children: N/A  . Years of education: N/A   Occupational History  . Not on file.   Social History Main Topics  . Smoking status: Former Smoker    Packs/day: 0.50    Years: 6.00    Types: Cigarettes    Quit date: 09/16/1971  . Smokeless tobacco: Never  Used  . Alcohol use No  . Drug use: No  . Sexual activity: Yes   Other Topics Concern  . Not on file   Social History Narrative  . No narrative on file    Family History  Problem Relation Age of Onset  . Heart failure Unknown   . Cancer Father        prostate  . Heart Problems Maternal Uncle   . Cancer Paternal Uncle        lung  . Cancer Paternal Grandmother        lung  . Cancer Brother        lung    ROS:  He has developed significant leg weakness but no fevers or chills, productive cough, hemoptysis, dysphasia, odynophagia, melena, hematochezia, dysuria, hematuria, rash, seizure activity, orthopnea, PND, pedal edema, claudication. Remaining systems are negative.  Physical Exam: Well-developed well-nourished in no acute distress.  Skin is warm and dry.  HEENT is normal.  Neck is supple.  Chest is clear to auscultation with normal expansion.  Cardiovascular exam is regular rate and rhythm.  Abdominal exam nontender or distended. No masses palpated. Extremities show no edema. neuro grossly intact  ECG-  sinus rhythm at a rate of 61. Anterior infarct with T-wave inversion unchanged. personally reviewed  A/P  1 Coronary artery disease-patient remains chest pain-free. Continue aspirin and statin.  2 hypertension-blood pressure is controlled. Continue present medications.  3 hyperlipidemia-continue statin. Laboratories from March 2018 personally reviewed. Total cholesterol 130 with LDL 64. Liver functions normal.   4 ischemic cardiomyopathy-continue ACE inhibitor and beta blocker.  5 previous ICD-followed by electrophysiology.  6 preoperative evaluation-patient has developed bilateral leg weakness that may be secondary to a protruding disc. He may require surgery. He has not had any chest pain but has limited functional capacity. He would likely require a nuclear stress test preoperatively.   Kirk Ruths, MD

## 2016-12-03 ENCOUNTER — Ambulatory Visit (INDEPENDENT_AMBULATORY_CARE_PROVIDER_SITE_OTHER): Payer: Managed Care, Other (non HMO) | Admitting: Cardiology

## 2016-12-03 ENCOUNTER — Encounter: Payer: Self-pay | Admitting: Cardiology

## 2016-12-03 VITALS — BP 128/82 | HR 61 | Ht 68.0 in | Wt 169.0 lb

## 2016-12-03 DIAGNOSIS — I2510 Atherosclerotic heart disease of native coronary artery without angina pectoris: Secondary | ICD-10-CM

## 2016-12-03 DIAGNOSIS — I10 Essential (primary) hypertension: Secondary | ICD-10-CM

## 2016-12-03 DIAGNOSIS — E7800 Pure hypercholesterolemia, unspecified: Secondary | ICD-10-CM | POA: Diagnosis not present

## 2016-12-03 DIAGNOSIS — I429 Cardiomyopathy, unspecified: Secondary | ICD-10-CM

## 2016-12-03 NOTE — Patient Instructions (Signed)
Your physician wants you to follow-up in: ONE YEAR WITH DR CRENSHAW You will receive a reminder letter in the mail two months in advance. If you don't receive a letter, please call our office to schedule the follow-up appointment.   If you need a refill on your cardiac medications before your next appointment, please call your pharmacy.  

## 2016-12-11 NOTE — Progress Notes (Signed)
Cardiology Office Note Date:  12/14/2016  Patient ID:  [name] [name], [name] [name] 10-26-1955, MRN [id number] PCP:  Claretta Fraise, MD  Cardiologist:  Dr. [name] Electrophysiologist: Dr. Lovena Le    Chief Complaint: over due EP/device visit  History of Present Illness: [name] [name] is a 61 y.o. male with history of CAD (PCI I n2003), ICM w/ICD, HTN, HLD.  He comes in today to be seen for Dr. Lovena Le.  He was last seen by him in 2016.  Has been following with Dr. [name]., last eearlier this month without symptoms, pending likely back surgery, given limited functional likely to require stress testing pre-op if proceeded with..  Outside of his back trouble he is doing well.  Denies any kind of CP, palpitations or SOB, no dizziness, near syncope or syncope.  His weight is up by our scale though her reports stable weight at home at 173.   Device information: SJM single chamber ICD, implanted 09/16/11, Dr. Lovena Le   Past Medical History:  Diagnosis Date  . AICD (automatic cardioverter/defibrillator) present   . Anginal pain (HCC)    NO RECENT ANGINA  . Bladder cancer ([place])   . CAD   . CARDIOMYOPATHY, ISCHEMIC   . CHF   . Chronic kidney disease   . HYPERLIPIDEMIA   . HYPERTENSION   . 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 Left ventricular dysfunction   . Myocardial infarction [facility]) 2003    Past Surgical History:  Procedure Laterality Date  . CARDIAC CATHETERIZATION    . CYSTOSCOPY W/ URETERAL STENT PLACEMENT Left 10/03/2013   Procedure: CYSTOSCOPY WITH RETROGRADE PYELOGRAM/URETERAL STENT PLACEMENT;  Surgeon: Sharyn Creamer, MD;  [facility];  Service: Urology;  Laterality: Left;  . CYSTOSCOPY W/ URETERAL STENT PLACEMENT Left 03/15/2014   Procedure: CYSTOSCOPY WITH RETROGRADE PYELOGRAM/URETERAL STENT PLACEMENT;  Surgeon: Alexis Frock, MD;  [facility];  Service: Urology;  Laterality: Left;  . CYSTOSCOPY WITH RETROGRADE PYELOGRAM, URETEROSCOPY AND  STENT PLACEMENT Bilateral 11/14/2013   Procedure: CYSTOSCOPY WITH BILATERAL RETROGRADE PYELOGRAM, LEFT URETEROSCOPY WITH BIOPSY  AND STENT EXCHANGE;  Surgeon: Alexis Frock, MD;  [facility];  Service: Urology;  Laterality: Bilateral;  . HERNIA REPAIR  2010   right inguinal  . ICD  09/16/2011  . IMPLANTABLE CARDIOVERTER DEFIBRILLATOR IMPLANT N/A 09/16/2011   Procedure: IMPLANTABLE CARDIOVERTER DEFIBRILLATOR IMPLANT;  Surgeon: Evans Lance, MD;  Location[facility] CATH LAB;  Service: Cardiovascular;  Laterality: N/A;  . kidney removed  03/17/2014  . pt had ear surgery    . ROBOT ASSITED LAPAROSCOPIC NEPHROURETERECTOMY Left 03/15/2014   Procedure: ROBOT ASSITED LAPAROSCOPIC NEPHROURETERECTOMY;  Surgeon: Alexis Frock, MD;  [facility];  Service: Urology;  Laterality: Left;  . TRANSURETHRAL RESECTION OF BLADDER TUMOR WITH GYRUS (TURBT-GYRUS) N/A 10/03/2013   Procedure: TRANSURETHRAL RESECTION OF BLADDER TUMOR WITH GYRUS (TURBT-GYRUS);  Surgeon: Sharyn Creamer, MD;  [facility];  Service: Urology;  Laterality: N/A;  . TRANSURETHRAL RESECTION OF BLADDER TUMOR WITH GYRUS (TURBT-GYRUS) N/A 11/14/2013   Procedure: TRANSURETHRAL RESECTION OF BLADDER TUMOR WITH GYRUS (TURBT-GYRUS);  Surgeon: Alexis Frock, MD;  [facility];  Service: Urology;  Laterality: N/A;    Current Outpatient Prescriptions  Medication Sig Dispense Refill  . aspirin EC 81 MG tablet Take 1 tablet (81 mg total) by mouth daily. 90 tablet 3  . atorvastatin (LIPITOR) 80 MG tablet TAKE 1 TABLET (80 MG TOTAL) BY MOUTH DAILY. 30 tablet 0  . carvedilol (COREG) 12.5 MG tablet TAKE 1  TABLET (12.5 MG TOTAL) BY MOUTH 2 (TWO) TIMES DAILY. 60 tablet 0  . CIALIS 20 MG tablet     . ramipril (ALTACE) 5 MG capsule TAKE 1 CAPSULE (5 MG TOTAL) BY MOUTH 2 (TWO) TIMES DAILY. 60 capsule 0   No current facility-administered medications for this visit.     Allergies:   Plavix [clopidogrel]   Social History:  The patient  reports that  he quit smoking about 45 years ago. His smoking use included Cigarettes. He has a 3.00 pack-year smoking history. He has never used smokeless tobacco. He reports that he does not drink alcohol or use drugs.   Family History:  The patient's family history includes Cancer in his brother, father, paternal grandmother, and paternal uncle; Heart Problems in his maternal uncle; Heart failure in his unknown relative.    ROS:  Please see the history of present illness.  All other systems are reviewed and otherwise negative.   PHYSICAL EXAM:  VS:  BP 128/78   Pulse 62   Ht 5\' 8"  (1.727 m)   Wt 176 lb (79.8 kg)   BMI 26.76 kg/m  BMI: Body mass index is 26.76 kg/m. Well nourished, well developed, in no acute distress  HEENT: normocephalic, atraumatic  Neck: no JVD, carotid bruits or masses Cardiac:  RRR; no significant murmurs, no rubs, or gallops Lungs:  CTA b/l, no wheezing, rhonchi or rales  Abd: soft, nontender MS: no deformity or atrophy Ext: trace is any edema  Skin: warm and dry, no rash Neuro:  No gross deficits appreciated Psych: euthymic mood, full affect  ICD site is stable, no tethering or discomfort   EKG:  Not done tdoay ICD interrogation done today and reviewed by myself: battery and lead measurements are good, no VT or device observations  08/17/11: TTE Study Conclusions - Left ventricle: The cavity size was normal. Wall thickness was normal. Systolic function was moderately to severely reduced. The estimated ejection fraction was in the range of 30% to 35%. There is akinesis of the anteroseptal and apical myocardium. There is akinesis of the midinferior myocardium. Doppler parameters are consistent with abnormal left ventricular relaxation (grade 1 diastolic dysfunction). - Aortic valve: Trivial regurgitation. - Mitral valve: Mild regurgitation. - Left atrium: The atrium was mildly dilated.   09/26/13: stress myoview Impression Exercise Capacity:   Lexiscan with no exercise. BP Response:  Normal blood pressure response. Clinical Symptoms:  Chest pressure, cough ECG Impression:  No significant ST segment change suggestive of ischemia. Comparison with Prior Nuclear Study: No significant change from previous study  Overall Impression:  High risk stress nuclear study with a large scar in the entire LAD territory with minimal periinfarct ischemia and severe LV dysfunction. SDS 2.  LV Ejection Fraction: 33%.  LV Wall Motion:  Akinesis of the entire anterior wall, entire septum and the apex.    Recent Labs: 06/10/2016: ALT 37; BUN 16; Creatinine, Ser 1.07; Hemoglobin 12.4; Platelets 214; Potassium 3.8; Sodium 142  06/10/2016: Chol/HDL Ratio 3.0; Cholesterol, Total 130; HDL 44; LDL Calculated 64; Triglycerides 108   CrCl cannot be calculated (Patient's most recent lab result is older than the maximum 21 days allowed.).   Wt Readings from Last 3 Encounters:  12/14/16 176 lb (79.8 kg)  12/03/16 169 lb (76.7 kg)  10/01/15 172 lb 12.8 oz (78.4 kg)     Other studies reviewed: Additional studies/records reviewed today include: summarized above  ASSESSMENT AND PLAN:  1. ICD     Stable device function,  no changes made  2. ICM, chronic CHF     Home weights reported as stable, no exam findings of fluid OL, he has trace edema that he reports is chronic     CorVue is below threshold though on an upwards trend     Discussed importance of minimizing sodium, I get the impression he is pretty liberal with this     continue daily home weights     On BB/ACE  3. CAD      No anginal complaint      On ASA, statin, BB      C/w Dr. [name]   He will be seeing neurosurgeon later this week or early next, should surgery be decided he will call to touch base about scheduling stresstesting.  4. HTN     Looks OK, no changes    Disposition: F/u with Q 3 month remote device checks, in-clinic EP in 1 year, sooner if needed.  Current medicines are  reviewed at length with the patient today.  The patient did not have any concerns regarding medicines.  Venetia Night, PA-C 12/14/2016 3:40 PM     [place] [address] [place] [address] [phone] (office)  [phone] (fax)

## 2016-12-14 ENCOUNTER — Ambulatory Visit (INDEPENDENT_AMBULATORY_CARE_PROVIDER_SITE_OTHER): Payer: Managed Care, Other (non HMO) | Admitting: Physician Assistant

## 2016-12-14 VITALS — BP 128/78 | HR 62 | Ht 68.0 in | Wt 176.0 lb

## 2016-12-14 DIAGNOSIS — Z95810 Presence of automatic (implantable) cardiac defibrillator: Secondary | ICD-10-CM | POA: Diagnosis not present

## 2016-12-14 DIAGNOSIS — I2510 Atherosclerotic heart disease of native coronary artery without angina pectoris: Secondary | ICD-10-CM | POA: Diagnosis not present

## 2016-12-14 DIAGNOSIS — I10 Essential (primary) hypertension: Secondary | ICD-10-CM | POA: Diagnosis not present

## 2016-12-14 DIAGNOSIS — I255 Ischemic cardiomyopathy: Secondary | ICD-10-CM

## 2016-12-14 NOTE — Patient Instructions (Addendum)
Medication Instructions:   Your physician recommends that you continue on your current medications as directed. Please refer to the Current Medication list given to you today.   If you need a refill on your cardiac medications before your next appointment, please call your pharmacy.  Labwork: NONE ORDERED  TODAY    Testing/Procedures: NONE ORDERED  TODAY    Follow-Up:  Your physician wants you to follow-up in: [place] will receive a reminder letter in the mail two months in advance. If you don't receive a letter, please call our office to schedule the follow-up appointment.   Remote monitoring is used to monitor your Pacemaker of ICD from home. This monitoring reduces the number of office visits required to check your device to one time per year. It allows [place] to keep an eye on the functioning of your device to ensure it is working properly. You are scheduled for a device check from home on .AS SCHEDULED  You may send your transmission at any time that day. If you have a wireless device, the transmission will be sent automatically. After your physician reviews your transmission, you will receive a postcard with your next transmission date.    Any Other Special Instructions Will Be Listed Below (If Applicable).

## 2016-12-24 ENCOUNTER — Other Ambulatory Visit: Payer: Self-pay | Admitting: Cardiology

## 2016-12-24 DIAGNOSIS — I2510 Atherosclerotic heart disease of native coronary artery without angina pectoris: Secondary | ICD-10-CM

## 2017-01-13 ENCOUNTER — Ambulatory Visit (INDEPENDENT_AMBULATORY_CARE_PROVIDER_SITE_OTHER): Payer: Managed Care, Other (non HMO) | Admitting: *Deleted

## 2017-01-13 ENCOUNTER — Telehealth: Payer: Self-pay | Admitting: Cardiology

## 2017-01-13 DIAGNOSIS — I255 Ischemic cardiomyopathy: Secondary | ICD-10-CM | POA: Diagnosis not present

## 2017-01-13 NOTE — Telephone Encounter (Signed)
LMOVM reminding pt to send remote transmission.   

## 2017-01-14 ENCOUNTER — Encounter: Payer: Self-pay | Admitting: Cardiology

## 2017-01-14 NOTE — Progress Notes (Signed)
Remote ICD transmission.   

## 2017-01-20 ENCOUNTER — Encounter: Payer: Self-pay | Admitting: Cardiology

## 2017-01-23 ENCOUNTER — Other Ambulatory Visit: Payer: Self-pay | Admitting: Cardiology

## 2017-01-23 DIAGNOSIS — I2510 Atherosclerotic heart disease of native coronary artery without angina pectoris: Secondary | ICD-10-CM

## 2017-01-24 NOTE — Telephone Encounter (Signed)
REFILL 

## 2017-02-02 DIAGNOSIS — M4316 Spondylolisthesis, lumbar region: Secondary | ICD-10-CM | POA: Insufficient documentation

## 2017-02-02 DIAGNOSIS — R29898 Other symptoms and signs involving the musculoskeletal system: Secondary | ICD-10-CM | POA: Insufficient documentation

## 2017-02-04 LAB — CUP PACEART REMOTE DEVICE CHECK
Implantable Lead Implant Date: 20130620
Lead Channel Setting Pacing Amplitude: 2.5 V
MDC IDC LEAD LOCATION: 753860
MDC IDC LEAD SERIAL: 316507
MDC IDC PG IMPLANT DT: 20130620
MDC IDC SESS DTM: 20181109102515
MDC IDC SET LEADCHNL RV PACING PULSEWIDTH: 0.5 ms
MDC IDC SET LEADCHNL RV SENSING SENSITIVITY: 0.5 mV
Pulse Gen Serial Number: 1033528

## 2017-02-28 ENCOUNTER — Other Ambulatory Visit: Payer: Self-pay | Admitting: Cardiology

## 2017-02-28 DIAGNOSIS — I2510 Atherosclerotic heart disease of native coronary artery without angina pectoris: Secondary | ICD-10-CM

## 2017-03-09 ENCOUNTER — Telehealth: Payer: Self-pay | Admitting: *Deleted

## 2017-03-09 ENCOUNTER — Telehealth (HOSPITAL_COMMUNITY): Payer: Self-pay

## 2017-03-09 DIAGNOSIS — Z01818 Encounter for other preprocedural examination: Secondary | ICD-10-CM

## 2017-03-09 DIAGNOSIS — I255 Ischemic cardiomyopathy: Secondary | ICD-10-CM

## 2017-03-09 NOTE — Telephone Encounter (Signed)
Close encounter 

## 2017-03-09 NOTE — Telephone Encounter (Signed)
PT NEEDS TO BE SCHEDULED FOR AN STRESS TEST BEFORE SURGERY  03-17-17  RESULTS FAXED ATTENTION :  TO JUDY CASPER   FAX: [place]  CONTACT NUMBER [phone]

## 2017-03-09 NOTE — Addendum Note (Signed)
Addended by: Vennie Homans on: 03/09/2017 03:31 PM   Modules accepted: Orders

## 2017-03-09 NOTE — Telephone Encounter (Signed)
Lexiscan Myoview ordered and send to scheduler to schedule

## 2017-03-11 ENCOUNTER — Encounter (HOSPITAL_COMMUNITY): Payer: Managed Care, Other (non HMO)

## 2017-03-11 ENCOUNTER — Ambulatory Visit (HOSPITAL_COMMUNITY)
Admission: RE | Admit: 2017-03-11 | Discharge: 2017-03-11 | Disposition: A | Discharge status: Home / Self Care | Payer: Managed Care, Other (non HMO) | Source: Ambulatory Visit | Attending: Cardiovascular Disease | Admitting: Cardiovascular Disease

## 2017-03-11 DIAGNOSIS — Z01818 Encounter for other preprocedural examination: Secondary | ICD-10-CM | POA: Insufficient documentation

## 2017-03-11 DIAGNOSIS — I255 Ischemic cardiomyopathy: Secondary | ICD-10-CM | POA: Insufficient documentation

## 2017-03-11 LAB — MYOCARDIAL PERFUSION IMAGING
LV dias vol: 195 mL (ref 62–150)
LV sys vol: 147 mL
Peak HR: 83 {beats}/min
Rest HR: 58 {beats}/min
SDS: 2
SRS: 29
SSS: 31
TID: 1.11

## 2017-03-11 MED ORDER — REGADENOSON 0.4 MG/5ML IV SOLN
0.4000 mg | Freq: Once | INTRAVENOUS | Status: AC
  Administered 2017-03-11: 0.4 mg via INTRAVENOUS

## 2017-03-11 MED ORDER — TECHNETIUM TC 99M TETROFOSMIN IV KIT
31.9000 | PACK | Freq: Once | INTRAVENOUS | Status: AC | PRN
  Administered 2017-03-11: 31.9 via INTRAVENOUS
  Filled 2017-03-11: qty 32

## 2017-03-11 MED ORDER — TECHNETIUM TC 99M TETROFOSMIN IV KIT
10.3000 | PACK | Freq: Once | INTRAVENOUS | Status: AC | PRN
  Administered 2017-03-11: 10.3 via INTRAVENOUS
  Filled 2017-03-11: qty 11

## 2017-03-25 MED ORDER — ONDANSETRON HCL 4 MG/2ML IJ SOLN
4.00 | INTRAMUSCULAR | Status: DC
Start: ? — End: 2017-03-25

## 2017-03-25 MED ORDER — ACETAMINOPHEN 325 MG PO TABS
650.00 | ORAL_TABLET | ORAL | Status: DC
Start: ? — End: 2017-03-25

## 2017-03-25 MED ORDER — ZOLPIDEM TARTRATE 5 MG PO TABS
5.00 | ORAL_TABLET | ORAL | Status: DC
Start: ? — End: 2017-03-25

## 2017-03-25 MED ORDER — ASPIRIN EC 81 MG PO TBEC
81.00 | DELAYED_RELEASE_TABLET | ORAL | Status: DC
Start: 2017-03-24 — End: 2017-03-25

## 2017-03-25 MED ORDER — HYDROCODONE-ACETAMINOPHEN 10-325 MG PO TABS
ORAL_TABLET | ORAL | Status: DC
Start: ? — End: 2017-03-25

## 2017-03-25 MED ORDER — CARVEDILOL 12.5 MG PO TABS
12.50 | ORAL_TABLET | ORAL | Status: DC
Start: 2017-03-24 — End: 2017-03-25

## 2017-03-25 MED ORDER — ENOXAPARIN SODIUM 40 MG/0.4ML ~~LOC~~ SOLN
40.00 | SUBCUTANEOUS | Status: DC
Start: 2017-03-24 — End: 2017-03-25

## 2017-03-25 MED ORDER — CYCLOBENZAPRINE HCL 10 MG PO TABS
10.00 | ORAL_TABLET | ORAL | Status: DC
Start: 2017-03-23 — End: 2017-03-25

## 2017-03-25 MED ORDER — VITAMIN B-12 1000 MCG PO TABS
ORAL_TABLET | ORAL | Status: DC
Start: 2017-03-24 — End: 2017-03-25

## 2017-03-25 MED ORDER — ACETAMINOPHEN 500 MG PO TABS
1000.00 | ORAL_TABLET | ORAL | Status: DC
Start: ? — End: 2017-03-25

## 2017-03-25 MED ORDER — SODIUM CHLORIDE 0.9 % IV SOLN
INTRAVENOUS | Status: DC
Start: ? — End: 2017-03-25

## 2017-03-25 MED ORDER — LISINOPRIL 20 MG PO TABS
40.00 | ORAL_TABLET | ORAL | Status: DC
Start: 2017-03-24 — End: 2017-03-25

## 2017-03-25 MED ORDER — ACETAMINOPHEN 650 MG RE SUPP
650.00 | RECTAL | Status: DC
Start: ? — End: 2017-03-25

## 2017-03-25 MED ORDER — DOCUSATE SODIUM 100 MG PO CAPS
100.00 | ORAL_CAPSULE | ORAL | Status: DC
Start: 2017-03-23 — End: 2017-03-25

## 2017-03-25 MED ORDER — SENNA 8.6 MG PO TABS
ORAL_TABLET | ORAL | Status: DC
Start: 2017-03-23 — End: 2017-03-25

## 2017-03-25 MED ORDER — GENERIC EXTERNAL MEDICATION
Status: DC
Start: ? — End: 2017-03-25

## 2017-03-25 MED ORDER — ATORVASTATIN CALCIUM 80 MG PO TABS
80.00 | ORAL_TABLET | ORAL | Status: DC
Start: 2017-03-23 — End: 2017-03-25

## 2017-03-25 MED ORDER — HYDRALAZINE HCL 20 MG/ML IJ SOLN
10.00 | INTRAMUSCULAR | Status: DC
Start: ? — End: 2017-03-25

## 2017-03-25 MED ORDER — FAMOTIDINE 20 MG PO TABS
20.00 | ORAL_TABLET | ORAL | Status: DC
Start: 2017-03-23 — End: 2017-03-25

## 2017-03-25 MED ORDER — DIPHENHYDRAMINE HCL 25 MG PO CAPS
25.00 | ORAL_CAPSULE | ORAL | Status: DC
Start: ? — End: 2017-03-25

## 2017-03-25 MED ORDER — HYDROCODONE-ACETAMINOPHEN 5-325 MG PO TABS
ORAL_TABLET | ORAL | Status: DC
Start: ? — End: 2017-03-25

## 2017-03-30 ENCOUNTER — Other Ambulatory Visit: Payer: Self-pay | Admitting: *Deleted

## 2017-03-30 ENCOUNTER — Other Ambulatory Visit: Payer: Self-pay | Admitting: Cardiology

## 2017-03-30 DIAGNOSIS — I2510 Atherosclerotic heart disease of native coronary artery without angina pectoris: Secondary | ICD-10-CM

## 2017-03-30 NOTE — Patient Outreach (Addendum)
[facility]) Care Management  03/30/2017  [name] [name] 1955/06/10 [id number]   Subjective: Telephone call to patient's home number, no answer, message states voicemail full, and unable to leave a message.     Objective: Per KPN (Knowledge Performance Now, point of care tool), C[employer]a iCollaborate, and chart review, patient hospitalized 03/17/17 -03/23/17 for L4/5 spondylolisthesis.   Status post LUMBAR SPINE FUSION on 03/17/17 at [facility].   Patient also has a history of bladder cancer.      Assessment: Received Cign[employer] Transition of care referral on 03/25/17.   Transition of care follow up pending patient contact.     Plan: RNCM will call patient for 2nd telephone outreach attempt, transition of care follow up, within 10 business days if no return call.      Sandrea Boer H. Annia Friendly, BSN, [place] Management [facility] Telephonic CM Phone: [phone] Fax: [phone]

## 2017-03-31 ENCOUNTER — Ambulatory Visit: Payer: Self-pay | Admitting: *Deleted

## 2017-03-31 ENCOUNTER — Other Ambulatory Visit: Payer: Self-pay | Admitting: *Deleted

## 2017-03-31 NOTE — Patient Outreach (Signed)
[facility] [facility]) Care Management  03/31/2017  JAYDEN KRATOCHVIL 1955/06/25 [id number]   Subjective: Telephone call to patient's mobile number, no answer, left HIPAA compliant voicemail message, and requested call back.    Objective: Per KPN (Knowledge Performance Now, point of care tool), Cigna iCollaborate, and chart review, patient hospitalized 03/17/17 -03/23/17 for L4/5 spondylolisthesis.   Status post LUMBAR SPINE FUSION on 03/17/17 at [facility].   Patient also has a history of bladder cancer.      Assessment: Received Cign[employer] Transition of care referral on 03/25/17.   Transition of care follow up pending patient contact.     Plan: RNCM will call patient for 3rd telephone outreach attempt, transition of care follow up, within 10 business days if no return call.      Mickel Schreur H. Annia Friendly, BSN, [place] Management [facility] Telephonic CM Phone: [phone] Fax: [phone]

## 2017-04-01 ENCOUNTER — Encounter: Payer: Self-pay | Admitting: *Deleted

## 2017-04-01 ENCOUNTER — Ambulatory Visit: Payer: Self-pay | Admitting: *Deleted

## 2017-04-01 ENCOUNTER — Other Ambulatory Visit: Payer: Self-pay | Admitting: *Deleted

## 2017-04-01 NOTE — Patient Outreach (Addendum)
[facility]) Care Management  04/01/2017  [name] [name] 18-Apr-1955 [id number]   Subjective: Received voicemail message from Erin Hearing, states he is returning call, and requested call back. Telephone call to patient's mobile number, spoke with patient, and HIPAA verified.  Discussed [facility] Care Management Cigna Transition of care follow up, patient voiced understanding, and is in agreement to follow up.  Patient states he is doing well, currently at a rehab facility [facility]), receiving therapy, goals met, will be discharged on 04/02/17, home health therapy will be set up by [facility], patient has the number to call if services has not been initiated by 04/05/17, and states he has a follow up appointment with surgeon on 04/28/17.   States he was discharged from the hospital on 03/23/17 to [facility].  Patient states he is able to manage self care and has assistance as needed.   Patient voices understanding of medical diagnosis, surgery, and treatment plan.  States he is accessing his Christella Scheuermann benefits as needed via member services number on back of card or through [email or web address]  Patient states he does not have any education material, transition of care, care coordination, disease management, disease monitoring, transportation, community resource, or pharmacy needs at this time. States he is very appreciative of the follow up and is in agreement to receive [place] Management information.     Objective:Per KPN (Knowledge Performance Now, point of care tool), Cigna iCollaborate, and chart review, patient hospitalized12/20/18 -03/23/17 forL4/5 spondylolisthesis. Status post LUMBAR SPINE FUSION on 03/17/17 [facility]. Patient also has a history of bladder cancer.      Assessment: Received Cigna Transition of care referral on 03/25/17.Transition of care follow up completed, no care management needs, and will proceed with case closure.       Plan: RNCM will send patient successful outreach letter, [facility] pamphlet, and magnet. RNCM will send case closure due to follow up completed / no care management needs request to [facility] [place] [facility].     Magdalyn Arenivas H. Annia Friendly, BSN, [place] Management [facility] Telephonic CM Phone: [phone] Fax: [phone]

## 2017-04-18 ENCOUNTER — Ambulatory Visit (INDEPENDENT_AMBULATORY_CARE_PROVIDER_SITE_OTHER): Payer: Managed Care, Other (non HMO) | Admitting: *Deleted

## 2017-04-18 DIAGNOSIS — I255 Ischemic cardiomyopathy: Secondary | ICD-10-CM | POA: Diagnosis not present

## 2017-04-18 NOTE — Progress Notes (Signed)
Remote ICD transmission.   

## 2017-04-19 ENCOUNTER — Encounter: Payer: Self-pay | Admitting: Cardiology

## 2017-04-22 ENCOUNTER — Telehealth: Payer: Self-pay

## 2017-04-22 NOTE — Telephone Encounter (Signed)
Patient referred to [facility] clinic by Ria Clock, device RN.  Call to patient and provided ICM intro.  He agreed to monthly ICM follow up.  Reviewed last remote transmission 04/18/2017 and advised he had some days with fluid.  Reviewed fluid symptoms to report and he denied any symptoms at this time. Discussed low salt diet.  He had back surgery in December and still have difficulty walking.  Weight at MD office was approximately 176 lbs.  Provided ICM direct number and encouraged to call for fluid symptoms.  Scheduled ICM remote transmission for 05/05/2017.

## 2017-04-26 ENCOUNTER — Other Ambulatory Visit: Payer: Self-pay | Admitting: Cardiology

## 2017-05-05 ENCOUNTER — Ambulatory Visit (INDEPENDENT_AMBULATORY_CARE_PROVIDER_SITE_OTHER): Payer: Managed Care, Other (non HMO)

## 2017-05-05 ENCOUNTER — Telehealth: Payer: Self-pay | Admitting: Cardiology

## 2017-05-05 DIAGNOSIS — I255 Ischemic cardiomyopathy: Secondary | ICD-10-CM | POA: Diagnosis not present

## 2017-05-05 DIAGNOSIS — Z95810 Presence of automatic (implantable) cardiac defibrillator: Secondary | ICD-10-CM

## 2017-05-05 NOTE — Telephone Encounter (Signed)
Spoke with pt and reminded pt of remote transmission that is due today. Pt verbalized understanding.   

## 2017-05-09 ENCOUNTER — Other Ambulatory Visit: Payer: Self-pay | Admitting: Cardiology

## 2017-05-09 DIAGNOSIS — I2510 Atherosclerotic heart disease of native coronary artery without angina pectoris: Secondary | ICD-10-CM

## 2017-05-09 NOTE — Progress Notes (Signed)
EPIC Encounter for ICM Monitoring  Patient Name: [name] [name] is a 62 y.o. male Date: 05/09/2017 Primary Care Physican: Claretta Fraise, MD Primary Cardiologist: [name] Electrophysiologist: Lovena Le Dry Weight: 176 lbs        Heart Failure questions reviewed, pt asymptomatic. He monitors ankles for swelling.  Discussed limiting salt.    Thoracic impedance normal but was bnormal suggesting fluid accumulation from 2/6 to 2/8.  No diuretic  Recommendations:  Advised to limit salt intake to 2000 mg/day.  Encouraged to call for fluid symptoms.  Follow-up plan: ICM clinic phone appointment on 06/09/2017.    Copy of ICM check sent to Dr. Lovena Le.   3 month ICM trend: 05/06/2017    1 Year ICM trend:       Rosalene Billings, RN 05/09/2017 2:37 PM

## 2017-05-11 LAB — CUP PACEART REMOTE DEVICE CHECK
Date Time Interrogation Session: 20190213203359
Implantable Lead Serial Number: 316507
Lead Channel Setting Pacing Pulse Width: 0.5 ms
MDC IDC LEAD IMPLANT DT: 20130620
MDC IDC LEAD LOCATION: 753860
MDC IDC PG IMPLANT DT: 20130620
MDC IDC SET LEADCHNL RV PACING AMPLITUDE: 2.5 V
MDC IDC SET LEADCHNL RV SENSING SENSITIVITY: 0.5 mV
Pulse Gen Serial Number: 1033528

## 2017-06-08 ENCOUNTER — Other Ambulatory Visit: Payer: Self-pay | Admitting: Cardiology

## 2017-06-08 DIAGNOSIS — I2510 Atherosclerotic heart disease of native coronary artery without angina pectoris: Secondary | ICD-10-CM

## 2017-06-09 ENCOUNTER — Telehealth: Payer: Self-pay

## 2017-06-09 ENCOUNTER — Ambulatory Visit (INDEPENDENT_AMBULATORY_CARE_PROVIDER_SITE_OTHER): Payer: Managed Care, Other (non HMO)

## 2017-06-09 DIAGNOSIS — I255 Ischemic cardiomyopathy: Secondary | ICD-10-CM

## 2017-06-09 DIAGNOSIS — Z95810 Presence of automatic (implantable) cardiac defibrillator: Secondary | ICD-10-CM

## 2017-06-09 NOTE — Progress Notes (Signed)
EPIC Encounter for ICM Monitoring  Patient Name: [name] [name] is a 62 y.o. male Date: 06/09/2017 Primary Care Physican: Claretta Fraise, MD Primary Cardiologist: [name] Electrophysiologist: Lovena Le Dry Weight:  Previous weight 176 lbs       Attempted call to patient and unable to reach.  Left detailed message regarding transmission.  Transmission reviewed.    Thoracic impedance normal.  No diuretic  Recommendations: Left voice mail with ICM number and encouraged to call if experiencing any fluid symptoms.  Follow-up plan: ICM clinic phone appointment on 07/18/2017.   Copy of ICM check sent to Dr. Lovena Le.   3 month ICM trend: 06/09/2017    1 Year ICM trend:       Rosalene Billings, RN 06/09/2017 12:37 PM

## 2017-06-09 NOTE — Telephone Encounter (Signed)
Remote ICM transmission received.  Attempted call to patient and left detailed message per DPR regarding transmission and next ICM scheduled for 07/18/2017.  Advised to return call for any fluid symptoms or questions.   

## 2017-06-28 ENCOUNTER — Other Ambulatory Visit: Payer: Self-pay | Admitting: *Deleted

## 2017-06-28 DIAGNOSIS — I2510 Atherosclerotic heart disease of native coronary artery without angina pectoris: Secondary | ICD-10-CM

## 2017-06-28 MED ORDER — CARVEDILOL 12.5 MG PO TABS: 12.5000 mg | ORAL_TABLET | Freq: Two times a day (BID) | ORAL | 2 refills | Status: DC

## 2017-07-18 ENCOUNTER — Encounter: Payer: Managed Care, Other (non HMO) | Admitting: *Deleted

## 2017-07-18 ENCOUNTER — Telehealth: Payer: Self-pay | Admitting: Cardiology

## 2017-07-18 NOTE — Telephone Encounter (Signed)
LMOVM reminding pt to send remote transmission.   

## 2017-07-20 ENCOUNTER — Encounter: Payer: Self-pay | Admitting: Cardiology

## 2017-07-28 ENCOUNTER — Ambulatory Visit (INDEPENDENT_AMBULATORY_CARE_PROVIDER_SITE_OTHER): Payer: Managed Care, Other (non HMO) | Admitting: *Deleted

## 2017-07-28 DIAGNOSIS — I255 Ischemic cardiomyopathy: Secondary | ICD-10-CM

## 2017-07-28 DIAGNOSIS — Z95810 Presence of automatic (implantable) cardiac defibrillator: Secondary | ICD-10-CM | POA: Diagnosis not present

## 2017-07-28 NOTE — Progress Notes (Signed)
Remote ICD transmission.   

## 2017-07-29 ENCOUNTER — Telehealth: Payer: Self-pay

## 2017-07-29 NOTE — Telephone Encounter (Signed)
Remote ICM transmission received.  Attempted call to patient and left detailed message per DPR regarding transmission and next ICM scheduled for 08/29/2017.  Advised to return call for any fluid symptoms or questions.    

## 2017-07-29 NOTE — Progress Notes (Signed)
EPIC Encounter for ICM Monitoring  Patient Name: [name] [name] is a 62 y.o. male Date: 07/29/2017 Primary Care Physican: Claretta Fraise, MD Primary Cardiologist:Crenshaw Electrophysiologist:Taylor Dry Weight:172lbs  (noted by [place] office visit weight on 07/26/2017)        Attempted call to patient and unable to reach.  Left detailed message regarding transmission.  Transmission reviewed.    Thoracic impedance normal but was abnormal suggesting fluid accumulation from 07/18/2017 - 07/26/2017.  No diuretic  Recommendations: Left voice mail with ICM number and encouraged to call if experiencing any fluid symptoms.  Follow-up plan: ICM clinic phone appointment on 08/29/2017.    Copy of ICM check sent to Dr. Lovena Le.   3 month ICM trend: 07/28/2017    1 Year ICM trend:       Rosalene Billings, RN 07/29/2017 11:20 AM

## 2017-08-02 ENCOUNTER — Encounter: Payer: Self-pay | Admitting: Cardiology

## 2017-08-03 ENCOUNTER — Other Ambulatory Visit: Payer: Self-pay | Admitting: Cardiology

## 2017-08-03 DIAGNOSIS — I2510 Atherosclerotic heart disease of native coronary artery without angina pectoris: Secondary | ICD-10-CM

## 2017-08-03 NOTE — Telephone Encounter (Signed)
Rx(s) sent to pharmacy electronically.  

## 2017-08-16 LAB — CUP PACEART REMOTE DEVICE CHECK
Date Time Interrogation Session: 20190521211443
Implantable Pulse Generator Implant Date: 20130620
MDC IDC LEAD IMPLANT DT: 20130620
MDC IDC LEAD LOCATION: 753860
MDC IDC LEAD SERIAL: 316507
Pulse Gen Serial Number: 1033528

## 2017-08-29 ENCOUNTER — Ambulatory Visit (INDEPENDENT_AMBULATORY_CARE_PROVIDER_SITE_OTHER): Payer: Managed Care, Other (non HMO)

## 2017-08-29 ENCOUNTER — Telehealth: Payer: Self-pay

## 2017-08-29 DIAGNOSIS — Z95810 Presence of automatic (implantable) cardiac defibrillator: Secondary | ICD-10-CM

## 2017-08-29 DIAGNOSIS — I255 Ischemic cardiomyopathy: Secondary | ICD-10-CM

## 2017-08-29 NOTE — Progress Notes (Signed)
EPIC Encounter for ICM Monitoring  Patient Name: [name] [name] is a 62 y.o. male Date: 08/29/2017 Primary Care Physican: Patient, No Pcp Per Primary Cardiologist:Crenshaw Electrophysiologist:Taylor Dry Weight:173lbs(noted by [place] office visit weight on 08/23/2017)       Attempted call to patient and unable to reach.  Left detailed message, per DPR, regarding transmission.  Transmission reviewed.    Thoracic impedance close to baseline normal.  No diuretic  Recommendations: Left voice mail with ICM number and encouraged to call if experiencing any fluid symptoms.  Follow-up plan: ICM clinic phone appointment on 09/30/2017.    Copy of ICM check sent to Dr. Lovena Le.   3 month ICM trend: 08/29/2017    1 Year ICM trend:       Rosalene Billings, RN 08/29/2017 1:53 PM

## 2017-08-29 NOTE — Telephone Encounter (Signed)
Spoke with pt and reminded pt of remote transmission that is due today. Pt verbalized understanding.   

## 2017-08-30 ENCOUNTER — Telehealth: Payer: Self-pay

## 2017-08-30 NOTE — Telephone Encounter (Signed)
Remote ICM transmission received.  Attempted call to patient and left detailed message, per DPR, regarding transmission and next ICM scheduled for 09/30/2017.  Advised to return call for any fluid symptoms or questions.

## 2017-09-26 DIAGNOSIS — G1221 Amyotrophic lateral sclerosis: Secondary | ICD-10-CM

## 2017-09-26 HISTORY — DX: Amyotrophic lateral sclerosis: G12.21

## 2017-09-30 ENCOUNTER — Telehealth: Payer: Self-pay | Admitting: Cardiology

## 2017-09-30 NOTE — Telephone Encounter (Signed)
LMOVM reminding pt to send remote transmission.   

## 2017-10-03 ENCOUNTER — Ambulatory Visit (INDEPENDENT_AMBULATORY_CARE_PROVIDER_SITE_OTHER): Payer: Managed Care, Other (non HMO)

## 2017-10-03 DIAGNOSIS — I429 Cardiomyopathy, unspecified: Secondary | ICD-10-CM | POA: Diagnosis not present

## 2017-10-03 DIAGNOSIS — Z95810 Presence of automatic (implantable) cardiac defibrillator: Secondary | ICD-10-CM | POA: Diagnosis not present

## 2017-10-03 NOTE — Progress Notes (Signed)
EPIC Encounter for ICM Monitoring  Patient Name: [name] [name] is a 62 y.o. male Date: 10/03/2017 Primary Care Physican: Patient, No Pcp Per Primary Cardiologist:Crenshaw Electrophysiologist:Taylor Dry Weight:174lbs      Heart Failure questions reviewed, pt asymptomatic today.  On 09/15/2017 he was prescribed Lasix 20 mg x 7 days only for ankle edema by [facility] physician which resulted in the impedance returning to baseline.  He was diagnosed with Primary Lateral Sclerorsis 3 weeks ago and referred to neurologist in [facility].     Thoracic impedance abnormal suggesting fluid accumulation starting 09/26/2017 when accumulation started again after completing 7 days of Lasix prescription.  No diuretic - Only prescribed 7 days of Lasix on 6/20.    Recommendations: Advised to limit salt intake.  His PCP told only prescribed short term dose of 7 days and advised to follow up with the cardiologist in the event he may want to prescribed a long term dose.    Follow-up plan: ICM clinic phone appointment on 10/13/2017 to recheck fluid levels.   Recall appts for Dr [name] and Tommye Standard, PA in September and encouraged patient to call to schedule the appointments.   Copy of ICM check sent to Dr. Lovena Le and Dr [name] for review and recommendations if needed.   3 month ICM trend: 10/01/2017    1 Year ICM trend:       Rosalene Billings, RN 10/03/2017 3:40 PM

## 2017-10-06 DIAGNOSIS — G1221 Amyotrophic lateral sclerosis: Secondary | ICD-10-CM | POA: Insufficient documentation

## 2017-10-13 ENCOUNTER — Other Ambulatory Visit: Payer: Self-pay | Admitting: Cardiology

## 2017-10-13 ENCOUNTER — Telehealth: Payer: Self-pay | Admitting: Cardiology

## 2017-10-13 ENCOUNTER — Ambulatory Visit (INDEPENDENT_AMBULATORY_CARE_PROVIDER_SITE_OTHER): Payer: Managed Care, Other (non HMO)

## 2017-10-13 DIAGNOSIS — I429 Cardiomyopathy, unspecified: Secondary | ICD-10-CM

## 2017-10-13 DIAGNOSIS — Z95810 Presence of automatic (implantable) cardiac defibrillator: Secondary | ICD-10-CM

## 2017-10-13 DIAGNOSIS — I2510 Atherosclerotic heart disease of native coronary artery without angina pectoris: Secondary | ICD-10-CM

## 2017-10-13 NOTE — Telephone Encounter (Signed)
Rx request sent to pharmacy.  

## 2017-10-13 NOTE — Telephone Encounter (Signed)
Left message for patient to call back  

## 2017-10-13 NOTE — Telephone Encounter (Signed)
New Message   Pt states he would like to speak with a nurse to give an update on his health. Please call

## 2017-10-14 NOTE — Progress Notes (Signed)
EPIC Encounter for ICM Monitoring  Patient Name: [name] [name] is a 62 y.o. male Date: 10/14/2017 Primary Care Physican: Patient, No Pcp Per Primary Cardiologist:Crenshaw Electrophysiologist:Taylor Dry Weight:174lbs      Heart Failure questions reviewed, pt asymptomatic.  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  Has history of prescribed Lasix x 7 days only by PCP on 09/15/2017.  Message sent to Dr [name] as requested by patient to update him about he ALS diagnosis.   Thoracic impedance returned to normal since last ICM transmission on 10/03/2017 but was abnormal suggesting fluid accumulation from 09/26/2017 - 10/10/2017.  Prescribed dosage: No diuretic  Recommendations: No changes.  Encouraged to call for fluid symptoms.  Follow-up plan: ICM clinic phone appointment on 11/10/2017.  Recall appts for Dr [name] and Tommye Standard, PA in September and encouraged patient to call to schedule the appointments.   Copy of ICM check sent to Dr. Lovena Le.   3 month ICM trend: 10/13/2017    1 Year ICM trend:       Rosalene Billings, RN 10/14/2017 9:11 AM

## 2017-10-14 NOTE — Telephone Encounter (Signed)
Left message to call back  

## 2017-10-17 ENCOUNTER — Other Ambulatory Visit: Payer: Self-pay

## 2017-10-17 MED ORDER — RAMIPRIL 5 MG PO CAPS
5.0000 mg | ORAL_CAPSULE | Freq: Two times a day (BID) | ORAL | 2 refills | Status: DC
Start: 2017-10-17 — End: 2018-01-09

## 2017-10-18 NOTE — Telephone Encounter (Signed)
Left message for pt to call.

## 2017-10-19 NOTE — Telephone Encounter (Signed)
Several messages have been left for pt to call back.Will await a return call from pt ./cy

## 2017-10-27 ENCOUNTER — Ambulatory Visit (INDEPENDENT_AMBULATORY_CARE_PROVIDER_SITE_OTHER): Payer: Managed Care, Other (non HMO) | Admitting: *Deleted

## 2017-10-27 ENCOUNTER — Encounter: Payer: Self-pay | Admitting: Cardiology

## 2017-10-27 ENCOUNTER — Telehealth: Payer: Self-pay

## 2017-10-27 DIAGNOSIS — I429 Cardiomyopathy, unspecified: Secondary | ICD-10-CM | POA: Diagnosis not present

## 2017-10-27 NOTE — Telephone Encounter (Signed)
I spoke with the patient about this.

## 2017-10-27 NOTE — Progress Notes (Signed)
Remote ICD transmission.   

## 2017-11-10 ENCOUNTER — Ambulatory Visit (INDEPENDENT_AMBULATORY_CARE_PROVIDER_SITE_OTHER): Payer: Managed Care, Other (non HMO)

## 2017-11-10 ENCOUNTER — Telehealth: Payer: Self-pay | Admitting: Cardiology

## 2017-11-10 DIAGNOSIS — I429 Cardiomyopathy, unspecified: Secondary | ICD-10-CM | POA: Diagnosis not present

## 2017-11-10 DIAGNOSIS — Z95810 Presence of automatic (implantable) cardiac defibrillator: Secondary | ICD-10-CM | POA: Diagnosis not present

## 2017-11-10 NOTE — Telephone Encounter (Signed)
LMOVM reminding pt to send remote transmission.   

## 2017-11-11 ENCOUNTER — Telehealth: Payer: Self-pay | Admitting: Cardiology

## 2017-11-11 NOTE — Telephone Encounter (Signed)
New Message    Pt is calling to make Dr. [name] aware that he was diagnosed with ALS, he wasn't sure if he knew

## 2017-11-11 NOTE — Progress Notes (Signed)
EPIC Encounter for ICM Monitoring  Patient Name: [name] [name] is a 62 y.o. male Date: 11/11/2017 Primary Care Physican: Patient, No Pcp Per Primary Cardiologist:Crenshaw Electrophysiologist:Taylor Dry Weight:Previous weight 174lbs       Attempted call to patient and unable to reach.  Left detailed message, per DPR, regarding transmission.  Transmission reviewed.  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   Thoracic impedance normal.  Prescribed dosage: No diuretic  Recommendations:Left voice mail with ICM number and encouraged to call if experiencing any fluid symptoms.  Follow-up plan: ICM clinic phone appointment on 12/12/2017.       Copy of ICM check sent to Dr. Lovena Le.   3 month ICM trend: 11/10/2017    1 Year ICM trend:       Rosalene Billings, RN 11/11/2017 8:54 AM

## 2017-11-11 NOTE — Telephone Encounter (Signed)
Message fwd to [place].

## 2017-11-23 ENCOUNTER — Encounter: Payer: Self-pay | Admitting: Physician Assistant

## 2017-12-06 NOTE — Progress Notes (Signed)
Cardiology Office Note Date:  12/14/2017  Patient ID:  [name] [name], [name] [name] 1956-02-12, MRN [id number] PCP:  Curly Rim, MD  Cardiologist:  Dr. [name] Electrophysiologist: Dr. Lovena Le    Chief Complaint: annual EP/device visit  History of Present Illness: [name] [name] is a 62 y.o. male with history of CAD (PCI in 2003), ICM w/ICD, HTN, HLD.  He comes in today to be seen for Dr. Lovena Le.  He was last seen by him in 2016.  Following with Dr. [name].  I saw him Sept 2018 for EP service, outside of his back trouble he was doing well.  Denied any kind of CP, palpitations or SOB, no dizziness, near syncope or syncope.  His weight was up by our scale though he reported stable weights at home.  Cardiac-wise, he is doing well.  No CP, palpitations or SOB, no near syncope or syncope, no shocks.  He sees Dr. [name] next month.  Unfortunately he has had progressive LE (b/l) muscle weakness thigh particularly, and underwent lumbar back surgery which was though to be the etiology.  With no improvement, further w/u was done and he has been diagnosed with ALS in July.  He has had fairly steady progression, ambulating with a walker now.  He is following closely with the team caring/following this at [facility].  He has had labs, pulmonary testing with so far good results, no affect on his respiratory muscles.     Device information: SJM single chamber ICD, implanted 09/16/11, Dr. Lovena Le   Past Medical History:  Diagnosis Date  . AICD (automatic cardioverter/defibrillator) present   . 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 Anginal pain (H[facility])    NO RECENT ANGINA  . Bladder cancer ([place])   . CAD   . CARDIOMYOPATHY, ISCHEMIC   . CHF   . Chronic kidney disease   . HYPERLIPIDEMIA   . HYPERTENSION   . 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 Left ventricular dysfunction   . Myocardial infarction [facility]) 2003    Past Surgical History:  Procedure Laterality Date    . CARDIAC CATHETERIZATION    . CYSTOSCOPY W/ URETERAL STENT PLACEMENT Left 10/03/2013   Procedure: CYSTOSCOPY WITH RETROGRADE PYELOGRAM/URETERAL STENT PLACEMENT;  Surgeon: Sharyn Creamer, MD;  Location: [facility];  Service: Urology;  Laterality: Left;  . CYSTOSCOPY W/ URETERAL STENT PLACEMENT Left 03/15/2014   Procedure: CYSTOSCOPY WITH RETROGRADE PYELOGRAM/URETERAL STENT PLACEMENT;  Surgeon: Alexis Frock, MD;  Location: [facility];  Service: Urology;  Laterality: Left;  . CYSTOSCOPY WITH RETROGRADE PYELOGRAM, URETEROSCOPY AND STENT PLACEMENT Bilateral 11/14/2013   Procedure: CYSTOSCOPY WITH BILATERAL RETROGRADE PYELOGRAM, LEFT URETEROSCOPY WITH BIOPSY  AND STENT EXCHANGE;  Surgeon: Alexis Frock, MD;  Location: [facility];  Service: Urology;  Laterality: Bilateral;  . HERNIA REPAIR  2010   right inguinal  . ICD  09/16/2011  . IMPLANTABLE CARDIOVERTER DEFIBRILLATOR IMPLANT N/A 09/16/2011   Procedure: IMPLANTABLE CARDIOVERTER DEFIBRILLATOR IMPLANT;  Surgeon: Evans Lance, MD;  Location: [facility] CATH LAB;  Service: Cardiovascular;  Laterality: N/A;  . kidney removed  03/17/2014  . pt had ear surgery    . ROBOT ASSITED LAPAROSCOPIC NEPHROURETERECTOMY Left 03/15/2014   Procedure: ROBOT ASSITED LAPAROSCOPIC NEPHROURETERECTOMY;  Surgeon: Alexis Frock, MD;  Location: [facility];  Service: Urology;  Laterality: Left;  . TRANSURETHRAL RESECTION OF BLADDER TUMOR WITH GYRUS (TURBT-GYRUS) N/A 10/03/2013   Procedure: TRANSURETHRAL RESECTION OF BLADDER TUMOR WITH GYRUS (TURBT-GYRUS);  Surgeon: Sharyn Creamer, MD;  Location: [facility];  Service: Urology;  Laterality: N/A;  . TRANSURETHRAL RESECTION OF BLADDER TUMOR WITH GYRUS (TURBT-GYRUS) N/A 11/14/2013   Procedure: TRANSURETHRAL RESECTION OF BLADDER TUMOR WITH GYRUS (TURBT-GYRUS);  Surgeon: Alexis Frock, MD;  Location: [facility];  Service: Urology;  Laterality: N/A;    Current Outpatient Medications  Medication Sig Dispense Refill  . aspirin EC 81 MG tablet Take 1 tablet  (81 mg total) by mouth daily. 90 tablet 3  . atorvastatin (LIPITOR) 80 MG tablet TAKE 1 TABLET BY MOUTH DAILY 90 tablet 1  . carvedilol (COREG) 12.5 MG tablet TAKE 1 TABLET BY MOUTH TWICE DAILY 180 tablet 1  . CIALIS 20 MG tablet     . ramipril (ALTACE) 5 MG capsule Take 1 capsule (5 mg total) by mouth 2 (two) times daily. 60 capsule 2  . riluzole (RILUTEK) 50 MG tablet Take 50 mg by mouth 2 (two) times daily.     No current facility-administered medications for this visit.     Allergies:   Plavix [clopidogrel]   Social History:  The patient  reports that he quit smoking about 46 years ago. His smoking use included cigarettes. He has a 3.00 pack-year smoking history. He has never used smokeless tobacco. He reports that he does not drink alcohol or use drugs.   Family History:  The patient's family history includes Cancer in his brother, father, paternal grandmother, and paternal uncle; Heart Problems in his maternal uncle; Heart failure in his unknown relative.    ROS:  Please see the history of present illness.  All other systems are reviewed and otherwise negative.   PHYSICAL EXAM:  VS:  BP 124/80   Pulse (!) 55   Ht 5\' 8"  (1.727 m)   Wt 171 lb (77.6 kg)   BMI 26.00 kg/m  BMI: Body mass index is 26 kg/m. Well nourished, well developed, in no acute distress  HEENT: normocephalic, atraumatic  Neck: no JVD, carotid bruits or masses Cardiac:  RRR; no significant murmurs, no rubs, or gallops Lungs:  CTA b/l, no wheezing, rhonchi or rales  Abd: soft, nontender MS: no deformity or atrophy Ext: no edema  Skin: warm and dry, no rash Neuro:  No gross deficits appreciated Psych: euthymic mood, full affect  ICD site is stable, no tethering or discomfort   EKG:  Done today and reviewed by myself: SB 55bpm, T changes appear similar to his prior EKG ICD interrogation done today and reviewed by myself: battery and lead measurements are good, no episodes or therapies, he had magnet  reversion noted to correlate with day of his back surgery in Dec.   08/17/11: TTE Study Conclusions - Left ventricle: The cavity size was normal. Wall thickness was normal. Systolic function was moderately to severely reduced. The estimated ejection fraction was in the range of 30% to 35%. There is akinesis of the anteroseptal and apical myocardium. There is akinesis of the midinferior myocardium. Doppler parameters are consistent with abnormal left ventricular relaxation (grade 1 diastolic dysfunction). - Aortic valve: Trivial regurgitation. - Mitral valve: Mild regurgitation. - Left atrium: The atrium was mildly dilated.   09/26/13: stress myoview Impression Exercise Capacity:  Lexiscan with no exercise. BP Response:  Normal blood pressure response. Clinical Symptoms:  Chest pressure, cough ECG Impression:  No significant ST segment change suggestive of ischemia. Comparison with Prior Nuclear Study: No significant change from previous study  Overall Impression:  High risk stress nuclear study with a large scar in the entire LAD territory with minimal periinfarct ischemia  and severe LV dysfunction. SDS 2.  LV Ejection Fraction: 33%.  LV Wall Motion:  Akinesis of the entire anterior wall, entire septum and the apex.    Recent Labs: No results found for requested labs within last 8760 hours.  No results found for requested labs within last 8760 hours.   CrCl cannot be calculated (Patient's most recent lab result is older than the maximum 21 days allowed.).   Wt Readings from Last 3 Encounters:  12/14/17 171 lb (77.6 kg)  03/11/17 176 lb (79.8 kg)  12/14/16 176 lb (79.8 kg)     Other studies reviewed: Additional studies/records reviewed today include: summarized above  ASSESSMENT AND PLAN:  1. ICD     Stable device function, no changes made  2. ICM, chronic CHF     No exam findings or symptoms to suggest fluid OL.  CoreVue is at threshold     continue  daily home weights, L. Short, RN follows w/ICM clinic     On BB/ACE  3. CAD      No anginal complaint      On ASA, statin, BB      C/w Dr. [name]    4. HTN     Looks OK, no changes    Disposition: continue remotes as usual, in-clinic with EP in 1 year, sooner if needed  Current medicines are reviewed at length with the patient today.  The patient did not have any concerns regarding medicines.  Venetia Night, PA-C 12/14/2017 10:53 AM     [facility] [place] [address] [phone] (office)  [phone] (fax)

## 2017-12-08 LAB — CUP PACEART REMOTE DEVICE CHECK
Battery Remaining Percentage: 48 %
HIGH POWER IMPEDANCE MEASURED VALUE: 68 Ohm
Implantable Pulse Generator Implant Date: 20130620
Lead Channel Impedance Value: 490 Ohm
Lead Channel Sensing Intrinsic Amplitude: 11.7 mV
MDC IDC LEAD IMPLANT DT: 20130620
MDC IDC LEAD LOCATION: 753860
MDC IDC LEAD SERIAL: 316507
MDC IDC MSMT BATTERY REMAINING LONGEVITY: 50 mo
MDC IDC SESS DTM: 20190912131922
MDC IDC SET LEADCHNL RV PACING AMPLITUDE: 2.5 V
MDC IDC SET LEADCHNL RV PACING PULSEWIDTH: 0.5 ms
MDC IDC SET LEADCHNL RV SENSING SENSITIVITY: 0.5 mV
MDC IDC STAT BRADY RV PERCENT PACED: 1 % — AB
Pulse Gen Serial Number: 1033528

## 2017-12-12 ENCOUNTER — Ambulatory Visit (INDEPENDENT_AMBULATORY_CARE_PROVIDER_SITE_OTHER): Payer: Managed Care, Other (non HMO)

## 2017-12-12 DIAGNOSIS — Z95810 Presence of automatic (implantable) cardiac defibrillator: Secondary | ICD-10-CM

## 2017-12-12 DIAGNOSIS — I429 Cardiomyopathy, unspecified: Secondary | ICD-10-CM | POA: Diagnosis not present

## 2017-12-13 ENCOUNTER — Telehealth: Payer: Self-pay

## 2017-12-13 NOTE — Telephone Encounter (Signed)
Remote ICM transmission received.  Attempted call to patient and left detailed message, per DPR, regarding transmission and next ICM scheduled for 01/16/2018.  Advised to return call for any fluid symptoms or questions.

## 2017-12-13 NOTE — Progress Notes (Signed)
EPIC Encounter for ICM Monitoring  Patient Name: [name] [name] is a 62 y.o. male Date: 12/13/2017 Primary Care Physican: Patient, No Pcp Per Primary Cardiologist:Crenshaw Electrophysiologist:Taylor Dry Weight:Previous weight 174lbs        Attempted call to patient and unable to reach.  Left detailed message, per DPR, regarding transmission.  Transmission reviewed.   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   Thoracic impedance normal.  No diuretic  Recommendations: Left voice mail with ICM number and encouraged to call if experiencing any fluid symptoms.  Follow-up plan: ICM clinic phone appointment on 01/16/2018.   Office appointment scheduled 12/14/2017 with Tommye Standard, PA.    Copy of ICM check sent to Dr. Lovena Le.   3 month ICM trend: 12/12/2017    1 Year ICM trend:       Rosalene Billings, RN 12/13/2017 4:10 PM

## 2017-12-14 ENCOUNTER — Encounter: Payer: Self-pay | Admitting: Physician Assistant

## 2017-12-14 ENCOUNTER — Ambulatory Visit (INDEPENDENT_AMBULATORY_CARE_PROVIDER_SITE_OTHER): Payer: Managed Care, Other (non HMO) | Admitting: Physician Assistant

## 2017-12-14 VITALS — BP 124/80 | HR 55 | Ht 68.0 in | Wt 171.0 lb

## 2017-12-14 DIAGNOSIS — I509 Heart failure, unspecified: Secondary | ICD-10-CM

## 2017-12-14 DIAGNOSIS — I5022 Chronic systolic (congestive) heart failure: Secondary | ICD-10-CM | POA: Diagnosis not present

## 2017-12-14 DIAGNOSIS — I2510 Atherosclerotic heart disease of native coronary artery without angina pectoris: Secondary | ICD-10-CM | POA: Diagnosis not present

## 2017-12-14 DIAGNOSIS — I10 Essential (primary) hypertension: Secondary | ICD-10-CM | POA: Diagnosis not present

## 2017-12-14 DIAGNOSIS — Z95810 Presence of automatic (implantable) cardiac defibrillator: Secondary | ICD-10-CM | POA: Diagnosis not present

## 2017-12-14 NOTE — Patient Instructions (Addendum)
Medication Instructions:   Your physician recommends that you continue on your current medications as directed. Please refer to the Current Medication list given to you today.    If you need a refill on your cardiac medications before your next appointment, please call your pharmacy.  Labwork: NONE ORDERED  TODAY   Testing/Procedures: NONE ORDERED  TODAY    Follow-Up: Your physician wants you to follow-up in: [place] will receive a reminder letter in the mail two months in advance. If you don't receive a letter, please call our office to schedule the follow-up appointment.   Remote monitoring is used to monitor your Pacemaker of ICD from home. This monitoring reduces the number of office visits required to check your device to one time per year. It allows [place] to keep an eye on the functioning of your device to ensure it is working properly. You are scheduled for a device check from home on . 01-26-18 You may send your transmission at any time that day. If you have a wireless device, the transmission will be sent automatically. After your physician reviews your transmission, you will receive a postcard with your next transmission date.      Any Other Special Instructions Will Be Listed Below (If Applicable).

## 2017-12-22 ENCOUNTER — Other Ambulatory Visit: Payer: Self-pay | Admitting: Internal Medicine

## 2017-12-30 ENCOUNTER — Ambulatory Visit: Payer: Managed Care, Other (non HMO) | Admitting: Cardiology

## 2018-01-03 ENCOUNTER — Other Ambulatory Visit: Payer: Self-pay | Admitting: Cardiology

## 2018-01-03 DIAGNOSIS — I2510 Atherosclerotic heart disease of native coronary artery without angina pectoris: Secondary | ICD-10-CM

## 2018-01-09 ENCOUNTER — Other Ambulatory Visit: Payer: Self-pay | Admitting: Cardiology

## 2018-01-09 NOTE — Telephone Encounter (Signed)
Rx request sent to pharmacy.  

## 2018-01-16 ENCOUNTER — Ambulatory Visit (INDEPENDENT_AMBULATORY_CARE_PROVIDER_SITE_OTHER): Payer: Managed Care, Other (non HMO)

## 2018-01-16 DIAGNOSIS — I5022 Chronic systolic (congestive) heart failure: Secondary | ICD-10-CM

## 2018-01-16 DIAGNOSIS — Z95810 Presence of automatic (implantable) cardiac defibrillator: Secondary | ICD-10-CM

## 2018-01-17 ENCOUNTER — Telehealth: Payer: Self-pay

## 2018-01-17 NOTE — Telephone Encounter (Signed)
Remote ICM transmission received.  Attempted call to patient regarding ICM remote transmission and left detailed message, per DPR, with next ICM remote transmission date of 02/09/2018.  Advised to return call for any fluid symptoms or questions.

## 2018-01-17 NOTE — Progress Notes (Signed)
EPIC Encounter for ICM Monitoring  Patient Name: [name] [name] is a 62 y.o. male Date: 01/17/2018 Primary Care Physican: Curly Rim, MD Primary Cardiologist:Crenshaw Electrophysiologist:Taylor Dry Weight:Previous weight174lbs        Attempted call to patient and unable to reach.  Left detailed message, per DPR, regarding transmission.  Transmission reviewed.    Thoracic impedance normal.   No diuretic  Recommendations: Left voice mail with ICM number and encouraged to call if experiencing any fluid symptoms.  Follow-up plan: ICM clinic phone appointment on 02/09/2018.   Office appointment scheduled 02/14/2018 with Dr. [name].    Copy of ICM check sent to Dr. Lovena Le.   3 month ICM trend: 01/16/2018    1 Year ICM trend:       Rosalene Billings, RN 01/17/2018 10:05 AM

## 2018-02-02 NOTE — Progress Notes (Signed)
HPI: FU coronary artery disease. He had an acute anterior infarct in 2003. At that time, he had PCI of his LAD and diagonal. Note, the Lcx and RCA had no disease. Carotid Dopplers in June of 2009 showed normal carotids. Echocardiogram in May of 2013 showed an ejection fraction of 30-35%. There was mild mitral regurgitation and trace aortic insufficiency. There was mild left atrial enlargement. Patient had ICD placed in June of 2013. Nuclear study December 2018 showed ejection fraction 25%.  There was prior infarct but no ischemia.  Since last seen,  he denies dyspnea, chest pain, palpitations or syncope.  No pedal edema.  He has been diagnosed with ALS.  Current Outpatient Medications  Medication Sig Dispense Refill  . aspirin EC 81 MG tablet Take 1 tablet (81 mg total) by mouth daily. 90 tablet 3  . atorvastatin (LIPITOR) 80 MG tablet TAKE 1 TABLET BY MOUTH DAILY 90 tablet 1  . carvedilol (COREG) 12.5 MG tablet TAKE 1 TABLET BY MOUTH TWICE DAILY 180 tablet 1  . CIALIS 20 MG tablet     . ramipril (ALTACE) 5 MG capsule TAKE 1 CAPSULE BY MOUTH 2 TIMES DAILY (NEED TO SEE DR BEFORE MORE REFILLS) 180 capsule 1  . riluzole (RILUTEK) 50 MG tablet Take 50 mg by mouth 2 (two) times daily.     No current facility-administered medications for this visit.      Past Medical History:  Diagnosis Date  . AICD (automatic cardioverter/defibrillator) present   . 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 Anginal pain (HCC)    NO RECENT ANGINA  . Bladder cancer ([place])   . CAD   . CARDIOMYOPATHY, ISCHEMIC   . CHF   . Chronic kidney disease   . HYPERLIPIDEMIA   . HYPERTENSION   . 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 Left ventricular dysfunction   . Myocardial infarction [facility]) 2003    Past Surgical History:  Procedure Laterality Date  . CARDIAC CATHETERIZATION    . CYSTOSCOPY W/ URETERAL STENT PLACEMENT Left 10/03/2013   Procedure: CYSTOSCOPY WITH RETROGRADE  PYELOGRAM/URETERAL STENT PLACEMENT;  Surgeon: Sharyn Creamer, MD;  Location: [facility];  Service: Urology;  Laterality: Left;  . CYSTOSCOPY W/ URETERAL STENT PLACEMENT Left 03/15/2014   Procedure: CYSTOSCOPY WITH RETROGRADE PYELOGRAM/URETERAL STENT PLACEMENT;  Surgeon: Alexis Frock, MD;  Location: [facility];  Service: Urology;  Laterality: Left;  . CYSTOSCOPY WITH RETROGRADE PYELOGRAM, URETEROSCOPY AND STENT PLACEMENT Bilateral 11/14/2013   Procedure: CYSTOSCOPY WITH BILATERAL RETROGRADE PYELOGRAM, LEFT URETEROSCOPY WITH BIOPSY  AND STENT EXCHANGE;  Surgeon: Alexis Frock, MD;  Location: [facility];  Service: Urology;  Laterality: Bilateral;  . HERNIA REPAIR  2010   right inguinal  . ICD  09/16/2011  . IMPLANTABLE CARDIOVERTER DEFIBRILLATOR IMPLANT N/A 09/16/2011   Procedure: IMPLANTABLE CARDIOVERTER DEFIBRILLATOR IMPLANT;  Surgeon: Evans Lance, MD;  Location: [facility] CATH LAB;  Service: Cardiovascular;  Laterality: N/A;  . kidney removed  03/17/2014  . pt had ear surgery    . ROBOT ASSITED LAPAROSCOPIC NEPHROURETERECTOMY Left 03/15/2014   Procedure: ROBOT ASSITED LAPAROSCOPIC NEPHROURETERECTOMY;  Surgeon: Alexis Frock, MD;  Location: [facility];  Service: Urology;  Laterality: Left;  . TRANSURETHRAL RESECTION OF BLADDER TUMOR WITH GYRUS (TURBT-GYRUS) N/A 10/03/2013   Procedure: TRANSURETHRAL RESECTION OF BLADDER TUMOR WITH GYRUS (TURBT-GYRUS);  Surgeon: Sharyn Creamer, MD;  Location: [facility];  Service: Urology;  Laterality: N/A;  . TRANSURETHRAL RESECTION OF BLADDER TUMOR WITH GYRUS (TURBT-GYRUS) N/A  11/14/2013   Procedure: TRANSURETHRAL RESECTION OF BLADDER TUMOR WITH GYRUS (TURBT-GYRUS);  Surgeon: Alexis Frock, MD;  Location: [facility];  Service: Urology;  Laterality: N/A;    Social History   Socioeconomic History  . Marital status: Married    Spouse name: Not on file  . Number of children: Not on file  . Years of education: Not on file  . Highest education level: Not on file  Occupational History    . Not on file  Social Needs  . Financial resource strain: Not on file  . Food insecurity:    Worry: Not on file    Inability: Not on file  . Transportation needs:    Medical: Not on file    Non-medical: Not on file  Tobacco Use  . Smoking status: Former Smoker    Packs/day: 0.50    Years: 6.00    Pack years: 3.00    Types: Cigarettes    Last attempt to quit: 09/16/1971    Years since quitting: 46.4  . Smokeless tobacco: Never Used  Substance and Sexual Activity  . Alcohol use: No  . Drug use: No  . Sexual activity: Yes  Lifestyle  . Physical activity:    Days per week: Not on file    Minutes per session: Not on file  . Stress: Not on file  Relationships  . Social connections:    Talks on phone: Not on file    Gets together: Not on file    Attends religious service: Not on file    Active member of club or organization: Not on file    Attends meetings of clubs or organizations: Not on file    Relationship status: Not on file  . Intimate partner violence:    Fear of current or ex partner: Not on file    Emotionally abused: Not on file    Physically abused: Not on file    Forced sexual activity: Not on file  Other Topics Concern  . Not on file  Social History Narrative  . Not on file    Family History  Problem Relation Age of Onset  . Cancer Father        prostate  . Heart Problems Maternal Uncle   . Cancer Paternal Uncle        lung  . Cancer Paternal Grandmother        lung  . Cancer Brother        lung  . Heart failure Unknown     ROS: Bilateral lower extremity weakness but no fevers or chills, productive cough, hemoptysis, dysphasia, odynophagia, melena, hematochezia, dysuria, hematuria, rash, seizure activity, orthopnea, PND, pedal edema, claudication. Remaining systems are negative.  Physical Exam: Well-developed well-nourished in no acute distress.  Skin is warm and dry.  HEENT is normal.  Neck is supple.  Chest is clear to auscultation with  normal expansion.  Cardiovascular exam is regular rate and rhythm.  Abdominal exam nontender or distended. No masses palpated. Extremities show no edema. neuro bilateral lower ext weakness   A/P  1 coronary artery disease-patient denies chest pain.  Plan to continue medical therapy with aspirin and statin.  Last nuclear study showed no ischemia.  2 hypertension-patient's blood pressure is controlled.  Continue present medications and follow.  3 hyperlipidemia-continue statin.  Check lipids and liver.  4 ischemic cardiomyopathy-continue ACE inhibitor and beta-blocker.  No evidence of congestive heart failure at present.  Check potassium and renal function.  5 prior ICD-followed by  electrophysiology.  6 recent diagnosis of ALS-management per neurology.  Kirk Ruths, MD

## 2018-02-09 ENCOUNTER — Ambulatory Visit (INDEPENDENT_AMBULATORY_CARE_PROVIDER_SITE_OTHER): Payer: Managed Care, Other (non HMO)

## 2018-02-09 ENCOUNTER — Ambulatory Visit (INDEPENDENT_AMBULATORY_CARE_PROVIDER_SITE_OTHER): Payer: Managed Care, Other (non HMO) | Admitting: *Deleted

## 2018-02-09 DIAGNOSIS — I429 Cardiomyopathy, unspecified: Secondary | ICD-10-CM | POA: Diagnosis not present

## 2018-02-09 DIAGNOSIS — I5022 Chronic systolic (congestive) heart failure: Secondary | ICD-10-CM

## 2018-02-09 DIAGNOSIS — Z95810 Presence of automatic (implantable) cardiac defibrillator: Secondary | ICD-10-CM

## 2018-02-09 NOTE — Progress Notes (Signed)
Remote ICD transmission.   

## 2018-02-10 ENCOUNTER — Telehealth: Payer: Self-pay

## 2018-02-10 NOTE — Telephone Encounter (Signed)
Remote ICM transmission received.  Attempted call to patient regarding ICM remote transmission and left detailed message, per DPR, with next ICM remote transmission date of 03/13/2018.  Advised to return call for any fluid symptoms or questions.   

## 2018-02-10 NOTE — Progress Notes (Signed)
EPIC Encounter for ICM Monitoring  Patient Name: [name] [name] is a 62 y.o. male Date: 02/10/2018 Primary Care Physican: Curly Rim, MD Primary Cardiologist:Crenshaw Electrophysiologist:Taylor Last Weight: 174lbs  Today's Weight:  unknown      Attempted call to patient and unable to reach.  Left detailed message, per DPR, regarding transmission.  Transmission reviewed.    Thoracic impedance just starting downward trend 02/11/2018 suggesting fluid accumulation from 01/31/2018 - 02/06/2018.   No diuretic  Recommendations:  Left voice mail with ICM number and encouraged to call if experiencing any fluid symptoms.  Follow-up plan: ICM clinic phone appointment on 03/13/2018.    Copy of ICM check sent to Dr. Lovena Le.   3 month ICM trend: 02/10/2018    1 Year ICM trend:       Rosalene Billings, RN 02/10/2018 8:43 AM

## 2018-02-14 ENCOUNTER — Ambulatory Visit (INDEPENDENT_AMBULATORY_CARE_PROVIDER_SITE_OTHER): Payer: Managed Care, Other (non HMO) | Admitting: Cardiology

## 2018-02-14 ENCOUNTER — Encounter: Payer: Self-pay | Admitting: Cardiology

## 2018-02-14 VITALS — BP 116/74 | HR 68 | Ht 68.0 in | Wt 167.8 lb

## 2018-02-14 DIAGNOSIS — I2510 Atherosclerotic heart disease of native coronary artery without angina pectoris: Secondary | ICD-10-CM | POA: Diagnosis not present

## 2018-02-14 DIAGNOSIS — I10 Essential (primary) hypertension: Secondary | ICD-10-CM | POA: Diagnosis not present

## 2018-02-14 DIAGNOSIS — E7800 Pure hypercholesterolemia, unspecified: Secondary | ICD-10-CM | POA: Diagnosis not present

## 2018-02-14 NOTE — Patient Instructions (Addendum)
Medication Instructions:  NO CHANGE If you need a refill on your cardiac medications before your next appointment, please call your pharmacy.   Lab work: Your physician recommends that you return for lab work PRIOR TO EATING If you have labs (blood work) drawn today and your tests are completely normal, you will receive your results only by: . MyChart Message (if you have MyChart) OR . A paper copy in the mail If you have any lab test that is abnormal or we need to change your treatment, we will call you to review the results.  Follow-Up: At [facility], you and your health needs are our priority.  As part of our continuing mission to provide you with exceptional heart care, we have created designated Provider Care Teams.  These Care Teams include your primary Cardiologist (physician) and Advanced Practice Providers (APPs -  Physician Assistants and Nurse Practitioners) who all work together to provide you with the care you need, when you need it. You will need a follow up appointment in 12 months.  Please call our office 2 months in advance to schedule this appointment.  You may see BRIAN CRENSHAW MD or one of the following Advanced Practice Providers on your designated Care Team:   Luke Kilroy, PA-C Krista Kroeger, PA-C . Callie Goodrich, PA-C   

## 2018-02-15 ENCOUNTER — Other Ambulatory Visit: Payer: Self-pay | Admitting: Cardiology

## 2018-02-15 DIAGNOSIS — I2510 Atherosclerotic heart disease of native coronary artery without angina pectoris: Secondary | ICD-10-CM

## 2018-02-16 LAB — COMPREHENSIVE METABOLIC PANEL
A/G RATIO: 1.4 (ref 1.2–2.2)
ALBUMIN: 4 g/dL (ref 3.6–4.8)
ALT: 46 IU/L — ABNORMAL HIGH (ref 0–44)
AST: 40 IU/L (ref 0–40)
Alkaline Phosphatase: 118 IU/L — ABNORMAL HIGH (ref 39–117)
BILIRUBIN TOTAL: 0.9 mg/dL (ref 0.0–1.2)
BUN / CREAT RATIO: 18 (ref 10–24)
BUN: 20 mg/dL (ref 8–27)
CALCIUM: 9.3 mg/dL (ref 8.6–10.2)
CHLORIDE: 103 mmol/L (ref 96–106)
CO2: 22 mmol/L (ref 20–29)
Creatinine, Ser: 1.09 mg/dL (ref 0.76–1.27)
GFR, EST AFRICAN AMERICAN: 82 mL/min/{1.73_m2} (ref >59)
GFR, EST NON AFRICAN AMERICAN: 71 mL/min/{1.73_m2} (ref >59)
Globulin, Total: 2.9 g/dL (ref 1.5–4.5)
Glucose: 97 mg/dL (ref 65–99)
Potassium: 4.2 mmol/L (ref 3.5–5.2)
Sodium: 138 mmol/L (ref 134–144)
TOTAL PROTEIN: 6.9 g/dL (ref 6.0–8.5)

## 2018-02-16 LAB — LIPID PANEL
CHOL/HDL RATIO: 3.9 ratio (ref 0.0–5.0)
Cholesterol, Total: 143 mg/dL (ref 100–199)
HDL: 37 mg/dL — ABNORMAL LOW (ref >39)
LDL Calculated: 81 mg/dL (ref 0–99)
Triglycerides: 127 mg/dL (ref 0–149)
VLDL Cholesterol Cal: 25 mg/dL (ref 5–40)

## 2018-02-22 ENCOUNTER — Other Ambulatory Visit: Payer: Self-pay | Admitting: *Deleted

## 2018-02-22 ENCOUNTER — Encounter: Payer: Self-pay | Admitting: *Deleted

## 2018-02-22 DIAGNOSIS — R7989 Other specified abnormal findings of blood chemistry: Secondary | ICD-10-CM

## 2018-02-22 DIAGNOSIS — R945 Abnormal results of liver function studies: Principal | ICD-10-CM

## 2018-03-13 ENCOUNTER — Ambulatory Visit (INDEPENDENT_AMBULATORY_CARE_PROVIDER_SITE_OTHER): Payer: Managed Care, Other (non HMO)

## 2018-03-13 DIAGNOSIS — I5022 Chronic systolic (congestive) heart failure: Secondary | ICD-10-CM | POA: Diagnosis not present

## 2018-03-13 DIAGNOSIS — Z95810 Presence of automatic (implantable) cardiac defibrillator: Secondary | ICD-10-CM

## 2018-03-14 NOTE — Progress Notes (Signed)
EPIC Encounter for ICM Monitoring  Patient Name: [name] [name] is a 62 y.o. male Date: 03/14/2018 Primary Care Physican: Curly Rim, MD Primary Cardiologist:Crenshaw Electrophysiologist:Taylor Last Weight: 174lbs     Today's Weight:  unknown                                                          Transmission reviewed.    Thoracic impedance normal.   No diuretic  Recommendations:  None  Follow-up plan: ICM clinic phone appointment on 04/17/2018.    Copy of ICM check sent to Dr. Lovena Le.   3 month ICM trend: 03/13/2018    1 Year ICM trend:       Rosalene Billings, RN 03/14/2018 11:27 AM

## 2018-04-11 LAB — CUP PACEART REMOTE DEVICE CHECK
Implantable Lead Implant Date: 20130620
Implantable Lead Location: 753860
Implantable Lead Model: 181
Implantable Lead Serial Number: 316507
Implantable Pulse Generator Implant Date: 20130620
MDC IDC SESS DTM: 20200114134905
Pulse Gen Serial Number: 1033528

## 2018-04-17 ENCOUNTER — Ambulatory Visit (INDEPENDENT_AMBULATORY_CARE_PROVIDER_SITE_OTHER): Payer: Managed Care, Other (non HMO)

## 2018-04-17 DIAGNOSIS — Z95810 Presence of automatic (implantable) cardiac defibrillator: Secondary | ICD-10-CM | POA: Diagnosis not present

## 2018-04-17 DIAGNOSIS — I5022 Chronic systolic (congestive) heart failure: Secondary | ICD-10-CM | POA: Diagnosis not present

## 2018-04-18 ENCOUNTER — Telehealth: Payer: Self-pay

## 2018-04-18 NOTE — Progress Notes (Signed)
EPIC Encounter for ICM Monitoring  Patient Name: [name] [name] is a 63 y.o. male Date: 04/18/2018 Primary Care Physican: Corrington, Delsa Grana, MD Primary Cardiologist:Crenshaw Electrophysiologist:Taylor Last Weight:174lbs Today's Weight: unknown  Attempted call to patient.  Left detailed message per DPR regarding transmission. Transmission reviewed.   Thoracic impedance normal.  No diuretic  Recommendations:Left voice mail with ICM number and encouraged to call if experiencing any fluid symptoms.  Follow-up plan: ICM clinic phone appointment on2/24/2020.   Copy of ICM check sent to Dr.Taylor.   3 month ICM trend: 04/17/2018    1 Year ICM trend:       Rosalene Billings, RN 04/18/2018 1:13 PM

## 2018-04-18 NOTE — Telephone Encounter (Signed)
Remote ICM transmission received.  Attempted call to patient regarding ICM remote transmission and left detailed message, per DPR, with next ICM remote transmission date of 05/22/2018.  Advised to return call for any fluid symptoms or questions.   

## 2018-05-22 ENCOUNTER — Ambulatory Visit (INDEPENDENT_AMBULATORY_CARE_PROVIDER_SITE_OTHER): Payer: Managed Care, Other (non HMO)

## 2018-05-22 DIAGNOSIS — Z95810 Presence of automatic (implantable) cardiac defibrillator: Secondary | ICD-10-CM | POA: Diagnosis not present

## 2018-05-22 DIAGNOSIS — I5022 Chronic systolic (congestive) heart failure: Secondary | ICD-10-CM

## 2018-05-23 ENCOUNTER — Ambulatory Visit (INDEPENDENT_AMBULATORY_CARE_PROVIDER_SITE_OTHER): Payer: Managed Care, Other (non HMO) | Admitting: *Deleted

## 2018-05-23 DIAGNOSIS — I429 Cardiomyopathy, unspecified: Secondary | ICD-10-CM | POA: Diagnosis not present

## 2018-05-23 NOTE — Progress Notes (Signed)
EPIC Encounter for ICM Monitoring  Patient Name: [name] [name] is a 63 y.o. male Date: 05/23/2018 Primary Care Physican: Corrington, Delsa Grana, MD Primary Cardiologist:Crenshaw Electrophysiologist:Taylor Last Weight:174lbs Today's Weight: unknown  Attempted call to patient.  Left detailed message per DPR regarding transmission. Transmission reviewed.   Thoracic impedancenormal.  No diuretic  Recommendations:Left voice mail with ICM number and encouraged to call if experiencing any fluid symptoms.  Follow-up plan: ICM clinic phone appointment on3/30/2020.   Copy of ICM check sent to Dr.Taylor.  3 month ICM trend: 05/22/2018    1 Year ICM trend:       Rosalene Billings, RN 05/23/2018 1:10 PM

## 2018-05-27 LAB — CUP PACEART REMOTE DEVICE CHECK
Implantable Lead Implant Date: 20130620
Implantable Lead Location: 753860
Implantable Lead Model: 181
Implantable Lead Serial Number: 316507
Implantable Pulse Generator Implant Date: 20130620
MDC IDC SESS DTM: 20200229085403
Pulse Gen Serial Number: 1033528

## 2018-05-29 ENCOUNTER — Encounter: Payer: Self-pay | Admitting: *Deleted

## 2018-05-31 ENCOUNTER — Encounter: Payer: Self-pay | Admitting: Cardiology

## 2018-05-31 NOTE — Progress Notes (Signed)
Remote ICD transmission.   

## 2018-06-05 ENCOUNTER — Telehealth: Payer: Self-pay | Admitting: Cardiology

## 2018-06-05 NOTE — Telephone Encounter (Signed)
° °  [facility] faxing lab results per patient

## 2018-06-07 NOTE — Telephone Encounter (Signed)
Spoke with pt, aware I have not gotten any labs.  Labs copied from care everywhere for dr crenshaw's review.

## 2018-06-26 ENCOUNTER — Other Ambulatory Visit: Payer: Self-pay

## 2018-06-27 ENCOUNTER — Telehealth: Payer: Self-pay

## 2018-06-27 NOTE — Telephone Encounter (Signed)
Spoke with patient to remind of missed remote transmission 

## 2018-07-03 NOTE — Progress Notes (Signed)
No ICM remote transmission received for 06/26/2018 and next ICM transmission scheduled for 07/17/2018.   

## 2018-07-25 ENCOUNTER — Telehealth: Payer: Self-pay

## 2018-07-25 ENCOUNTER — Other Ambulatory Visit: Payer: Self-pay

## 2018-07-25 NOTE — Telephone Encounter (Signed)
Left message for patient to remind of missed remote transmission.  

## 2018-07-31 ENCOUNTER — Telehealth: Payer: Self-pay | Admitting: Internal Medicine

## 2018-07-31 ENCOUNTER — Other Ambulatory Visit: Payer: Self-pay

## 2018-07-31 MED ORDER — RAMIPRIL 5 MG PO CAPS: ORAL_CAPSULE | ORAL | 1 refills | Status: DC

## 2018-07-31 NOTE — Telephone Encounter (Signed)
[facility] attempted to contact patient on 07/25/18 per notes in Epic. Pt's last [place] transmission was received on 05/22/18, monitor last updated on 05/30/18. Pt is overdue for ICM transmissions.

## 2018-07-31 NOTE — Telephone Encounter (Signed)
New message:    Patient would like to up date insurance.

## 2018-07-31 NOTE — Telephone Encounter (Signed)
New message:    Patient states some one called him concering his device. Please call patient.

## 2018-08-01 NOTE — Telephone Encounter (Signed)
Spoke w/ pt and he stated that he hasn't sent recently b/c he didn't have insurance. He now has medicaid and agrees to send transmission today. I informed him that I would send a message to billing to department to call him and get his new insurance information. Pt verbalized understanding.

## 2018-08-01 NOTE — Telephone Encounter (Signed)
ICM follow up scheduled for 08/02/2018.

## 2018-08-02 ENCOUNTER — Other Ambulatory Visit: Payer: Self-pay

## 2018-08-08 NOTE — Progress Notes (Signed)
No ICM remote transmission received for 08/02/2018 and next ICM transmission scheduled for 08/23/2018.

## 2018-08-22 ENCOUNTER — Ambulatory Visit (INDEPENDENT_AMBULATORY_CARE_PROVIDER_SITE_OTHER): Payer: Medicare Other | Admitting: *Deleted

## 2018-08-22 DIAGNOSIS — I429 Cardiomyopathy, unspecified: Secondary | ICD-10-CM

## 2018-08-22 DIAGNOSIS — I5022 Chronic systolic (congestive) heart failure: Secondary | ICD-10-CM

## 2018-08-23 ENCOUNTER — Ambulatory Visit (INDEPENDENT_AMBULATORY_CARE_PROVIDER_SITE_OTHER): Payer: Medicare Other

## 2018-08-23 DIAGNOSIS — Z95810 Presence of automatic (implantable) cardiac defibrillator: Secondary | ICD-10-CM

## 2018-08-23 DIAGNOSIS — I5022 Chronic systolic (congestive) heart failure: Secondary | ICD-10-CM | POA: Diagnosis not present

## 2018-08-23 LAB — CUP PACEART REMOTE DEVICE CHECK
Date Time Interrogation Session: 20200527063000
Implantable Lead Implant Date: 20130620
Implantable Lead Location: 753860
Implantable Lead Model: 181
Implantable Lead Serial Number: 316507
Implantable Pulse Generator Implant Date: 20130620
Pulse Gen Serial Number: 1033528

## 2018-08-25 NOTE — Progress Notes (Signed)
EPIC Encounter for ICM Monitoring  Patient Name: [name] [name] is a 63 y.o. male Date: 08/25/2018 Primary Care Physican: Curly Rim, MD Primary Cardiologist:Crenshaw Electrophysiologist:Taylor Last Weight:174lbs  Transmission reviewed and results sent via mychart.   Thoracic impedancenormal with the exception impedance suggested fluid accumulation from 08/05/2018 - 08/09/2018.  No diuretic  Recommendations: Recommendation to limit salt and fluid intake.  Follow-up plan: ICM clinic phone appointment on6/29/2020.   Copy of ICM check sent to Dr.Taylor.   3 month ICM trend: 08/22/2018    1 Year ICM trend:       Rosalene Billings, RN 08/25/2018 1:39 PM

## 2018-08-31 NOTE — Progress Notes (Signed)
Remote ICD transmission.   

## 2018-09-13 ENCOUNTER — Other Ambulatory Visit: Payer: Self-pay

## 2018-09-13 NOTE — Telephone Encounter (Signed)
REFILL 

## 2018-09-14 ENCOUNTER — Other Ambulatory Visit: Payer: Self-pay

## 2018-09-15 ENCOUNTER — Telehealth: Payer: Self-pay | Admitting: Cardiology

## 2018-09-15 DIAGNOSIS — I2510 Atherosclerotic heart disease of native coronary artery without angina pectoris: Secondary | ICD-10-CM

## 2018-09-15 MED ORDER — ATORVASTATIN CALCIUM 80 MG PO TABS: 80.0000 mg | ORAL_TABLET | Freq: Every day | ORAL | 1 refills | Status: DC

## 2018-09-15 NOTE — Telephone Encounter (Signed)
New Message               *STAT* If patient is at the pharmacy, call can be transferred to refill team.   1. Which medications need to be refilled? (please list name of each medication and dose if known) Atorvasation   2. Which pharmacy/location (including street and city if local pharmacy) is medication to be sent to [facility]  3. Do they need a 30 day or 90 day supply? [id number]  Pharmacy is waiting for the order

## 2018-09-15 NOTE — Telephone Encounter (Signed)
Rx(s) sent to pharmacy electronically.  

## 2018-09-25 ENCOUNTER — Ambulatory Visit (INDEPENDENT_AMBULATORY_CARE_PROVIDER_SITE_OTHER): Payer: Medicare Other

## 2018-09-25 DIAGNOSIS — Z95810 Presence of automatic (implantable) cardiac defibrillator: Secondary | ICD-10-CM

## 2018-09-25 DIAGNOSIS — I5022 Chronic systolic (congestive) heart failure: Secondary | ICD-10-CM | POA: Diagnosis not present

## 2018-09-27 NOTE — Progress Notes (Signed)
EPIC Encounter for ICM Monitoring  Patient Name: [name] [name] is a 63 y.o. male Date: 09/27/2018 Primary Care Physican: Curly Rim, MD Primary Cardiologist:Crenshaw Electrophysiologist:Taylor Last Weight:174lbs  Transmission reviewed and results sent via mychart.   Corvue thoracic impedancenormal.  No diuretic  Recommendations: Recommendation to limit salt and fluid intake.  Follow-up plan: ICM clinic phone appointment on8/05/2018.   Copy of ICM check sent to Dr.Taylor.    Direct trend viewer: 09/26/2018      Rosalene Billings, RN 09/27/2018 1:35 PM

## 2018-10-30 ENCOUNTER — Ambulatory Visit (INDEPENDENT_AMBULATORY_CARE_PROVIDER_SITE_OTHER): Payer: Medicare Other

## 2018-10-30 DIAGNOSIS — I5022 Chronic systolic (congestive) heart failure: Secondary | ICD-10-CM

## 2018-10-30 DIAGNOSIS — Z95810 Presence of automatic (implantable) cardiac defibrillator: Secondary | ICD-10-CM | POA: Diagnosis not present

## 2018-11-01 NOTE — Progress Notes (Signed)
EPIC Encounter for ICM Monitoring  Patient Name: [name] [name] is a 63 y.o. male Date: 11/01/2018 Primary Care Physican: Corrington, Delsa Grana, MD Primary Cardiologist:Crenshaw Electrophysiologist:Taylor Last Weight:174lbs  Attempted call to patient and unable to reach.  Left detailed message per DPR regarding transmission. Transmission reviewed.   Corvue thoracic impedanceslightly below baseline normal.  No diuretic  Recommendations:Unable to reach.    Follow-up plan: ICM clinic phone appointment on9/10/2018.   Copy of ICM check sent to Dr.Taylor.  3 month ICM trend: 10/30/2018    1 Year ICM trend:       Rosalene Billings, RN 11/01/2018 3:16 PM

## 2018-11-22 ENCOUNTER — Ambulatory Visit (INDEPENDENT_AMBULATORY_CARE_PROVIDER_SITE_OTHER): Payer: Medicare Other | Admitting: *Deleted

## 2018-11-22 DIAGNOSIS — I429 Cardiomyopathy, unspecified: Secondary | ICD-10-CM | POA: Diagnosis not present

## 2018-11-22 LAB — CUP PACEART REMOTE DEVICE CHECK
Date Time Interrogation Session: 20200826170446
Implantable Lead Implant Date: 20130620
Implantable Lead Location: 753860
Implantable Lead Model: 181
Implantable Lead Serial Number: 316507
Implantable Pulse Generator Implant Date: 20130620
Pulse Gen Serial Number: 1033528

## 2018-11-24 ENCOUNTER — Telehealth: Payer: Self-pay | Admitting: Cardiology

## 2018-11-24 DIAGNOSIS — I2510 Atherosclerotic heart disease of native coronary artery without angina pectoris: Secondary | ICD-10-CM

## 2018-11-24 MED ORDER — CARVEDILOL 12.5 MG PO TABS: 12.5000 mg | ORAL_TABLET | Freq: Two times a day (BID) | ORAL | 0 refills | Status: DC

## 2018-11-24 NOTE — Telephone Encounter (Signed)
°*  STAT* If patient is at the pharmacy, call can be transferred to refill team.   1. Which medications need to be refilled? (please list name of each medication and dose if known)  carvedilol (COREG) 12.5 MG tablet  2. Which pharmacy/location (including street and city if local pharmacy) is medication to be sent to? [id number] - [place], [place] - [address]  3. Do they need a 30 day or 90 day supply? 30  Patient is in the process of switching mail order pharmacies. He will need a new RX sent to  [place], [place] [facility].   In the mean time, [facility] will need [facility] 30 day fill

## 2018-11-24 NOTE — Telephone Encounter (Signed)
Pt's medication was sent to pt's pharmacy as requested. Confirmation received.  °

## 2018-11-30 ENCOUNTER — Encounter: Payer: Self-pay | Admitting: Cardiology

## 2018-11-30 NOTE — Progress Notes (Signed)
Remote ICD transmission.   

## 2018-12-05 ENCOUNTER — Ambulatory Visit (INDEPENDENT_AMBULATORY_CARE_PROVIDER_SITE_OTHER): Payer: Medicare Other

## 2018-12-05 ENCOUNTER — Telehealth: Payer: Self-pay

## 2018-12-05 DIAGNOSIS — I5022 Chronic systolic (congestive) heart failure: Secondary | ICD-10-CM

## 2018-12-05 DIAGNOSIS — Z95810 Presence of automatic (implantable) cardiac defibrillator: Secondary | ICD-10-CM

## 2018-12-05 NOTE — Telephone Encounter (Signed)
Remote ICM transmission received.  Attempted call to patient regarding ICM remote transmission and left message to return call   

## 2018-12-05 NOTE — Progress Notes (Signed)
EPIC Encounter for ICM Monitoring  Patient Name: [name] [name] is a 63 y.o. male Date: 12/05/2018 Primary Care Physican: Curly Rim, MD Primary Cardiologist:Crenshaw Electrophysiologist:Taylor Last Weight:174lbs  Attempted call to patient and unable to reach.  Left message to return call. Transmission reviewed.   Corvue thoracic impedancesuggesting possible ongoing fluid accumulation since 12/03/2018.  No diuretic  Recommendations:Unable to reach.    Follow-up plan: ICM clinic phone appointment on 12/13/2018 to recheck fluid levels.  91 day device clinic remote transmission 02/21/2019.     Copy of ICM check sent to Dr. Lovena Le and Dr [name].   3 month ICM trend: 12/05/2018    1 Year ICM trend:       Rosalene Billings, RN 12/05/2018 10:45 AM

## 2018-12-13 ENCOUNTER — Ambulatory Visit (INDEPENDENT_AMBULATORY_CARE_PROVIDER_SITE_OTHER): Payer: Medicare Other

## 2018-12-13 DIAGNOSIS — I5022 Chronic systolic (congestive) heart failure: Secondary | ICD-10-CM

## 2018-12-13 DIAGNOSIS — Z95810 Presence of automatic (implantable) cardiac defibrillator: Secondary | ICD-10-CM

## 2018-12-13 NOTE — Progress Notes (Signed)
EPIC Encounter for ICM Monitoring  Patient Name: [name] [name] is a 63 y.o. male Date: 12/13/2018 Primary Care Physican: Curly Rim, MD Primary Cardiologist:Crenshaw Electrophysiologist:Taylor Last Weight:174lbs  Transmission reviewed.   Corvue thoracic impedancereturned to normal since 12/05/2018 remote transmission.  No diuretic  Recommendations:None  Follow-up plan: ICM clinic phone appointment on 01/16/2019.  91 day device clinic remote transmission 02/21/2019.     Copy of ICM check sent to Dr. Lovena Le.  3 month ICM trend: 12/13/2018    1 Year ICM trend:       Rosalene Billings, RN 12/13/2018 9:36 AM

## 2018-12-21 ENCOUNTER — Other Ambulatory Visit: Payer: Self-pay | Admitting: Cardiology

## 2019-01-16 ENCOUNTER — Ambulatory Visit (INDEPENDENT_AMBULATORY_CARE_PROVIDER_SITE_OTHER): Payer: Medicare Other

## 2019-01-16 DIAGNOSIS — I5022 Chronic systolic (congestive) heart failure: Secondary | ICD-10-CM | POA: Diagnosis not present

## 2019-01-16 DIAGNOSIS — Z95810 Presence of automatic (implantable) cardiac defibrillator: Secondary | ICD-10-CM | POA: Diagnosis not present

## 2019-01-19 NOTE — Progress Notes (Signed)
EPIC Encounter for ICM Monitoring  Patient Name: [name] [name] is a 63 y.o. male Date: 01/19/2019 Primary Care Physican: Curly Rim, MD Primary Cardiologist:Crenshaw Electrophysiologist:Taylor Last Weight:174lbs  Transmission reviewed.   Corvue thoracic impedancereturned to normal since 12/05/2018 remote transmission.  No diuretic  Recommendations:None  Follow-up plan: ICM clinic phone appointment on12/11/2018. 91 day device clinic remote transmission 02/21/2019. OV with Dr Lovena Le 02/21/2019  Copy of ICM check sent to Dr.Taylor.   3 month ICM trend: 01/16/2019    1 Year ICM trend:       Rosalene Billings, RN 01/19/2019 2:37 PM

## 2019-02-08 ENCOUNTER — Other Ambulatory Visit: Payer: Self-pay | Admitting: Cardiology

## 2019-02-08 DIAGNOSIS — I2510 Atherosclerotic heart disease of native coronary artery without angina pectoris: Secondary | ICD-10-CM

## 2019-02-16 ENCOUNTER — Other Ambulatory Visit: Payer: Self-pay | Admitting: Cardiology

## 2019-02-16 DIAGNOSIS — I2510 Atherosclerotic heart disease of native coronary artery without angina pectoris: Secondary | ICD-10-CM

## 2019-02-21 ENCOUNTER — Other Ambulatory Visit: Payer: Self-pay

## 2019-02-21 ENCOUNTER — Encounter: Payer: Self-pay | Admitting: Internal Medicine

## 2019-02-21 ENCOUNTER — Ambulatory Visit (INDEPENDENT_AMBULATORY_CARE_PROVIDER_SITE_OTHER): Payer: Medicare Other | Admitting: *Deleted

## 2019-02-21 ENCOUNTER — Ambulatory Visit (INDEPENDENT_AMBULATORY_CARE_PROVIDER_SITE_OTHER): Payer: Medicare Other | Admitting: Internal Medicine

## 2019-02-21 VITALS — BP 120/82 | HR 68 | Ht 68.0 in | Wt 170.0 lb

## 2019-02-21 DIAGNOSIS — I255 Ischemic cardiomyopathy: Secondary | ICD-10-CM | POA: Diagnosis not present

## 2019-02-21 DIAGNOSIS — Z95810 Presence of automatic (implantable) cardiac defibrillator: Secondary | ICD-10-CM

## 2019-02-21 LAB — CUP PACEART REMOTE DEVICE CHECK
Date Time Interrogation Session: 20201125113301
Implantable Lead Implant Date: 20130620
Implantable Lead Location: 753860
Implantable Lead Model: 181
Implantable Lead Serial Number: 316507
Implantable Pulse Generator Implant Date: 20130620
Pulse Gen Serial Number: 1033528

## 2019-02-21 NOTE — Progress Notes (Signed)
HPI [name] [name] returns today for ICD followup. He is a pleasant 63 yo man with an ICM, s/p MI, LV dysfunction s/p ICD insertion. He has been diagnosed with ALS. In the interim, he has done well with no chest pain. He has some weakness in his legs and a little in his left arm. No ICD therapies. Allergies  Allergen Reactions  . Plavix [Clopidogrel] Itching and Rash     Current Outpatient Medications  Medication Sig Dispense Refill  . aspirin EC 81 MG tablet Take 1 tablet (81 mg total) by mouth daily. 90 tablet 3  . atorvastatin (LIPITOR) 80 MG tablet Take 1 tablet (80 mg total) by mouth daily. Please call and schedule an appt for further refills 90 tablet 0  . carvedilol (COREG) 12.5 MG tablet Take 1 tablet (12.5 mg total) by mouth 2 (two) times daily with a meal. 180 tablet 0  . CIALIS 20 MG tablet     . ramipril (ALTACE) 5 MG capsule TAKE 1 CAPSULE BY MOUTH  TWICE DAILY need office visit 180 capsule 3  . riluzole (RILUTEK) 50 MG tablet Take 50 mg by mouth 2 (two) times daily.     No current facility-administered medications for this visit.      Past Medical History:  Diagnosis Date  . AICD (automatic cardioverter/defibrillator) present   . 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 Anginal pain (HCC)    NO RECENT ANGINA  . Bladder cancer ([place])   . CAD   . CARDIOMYOPATHY, ISCHEMIC   . CHF   . Chronic kidney disease   . HYPERLIPIDEMIA   . HYPERTENSION   . 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 Left ventricular dysfunction   . Myocardial infarction ([place]) 2003    ROS:   All systems reviewed and negative except as noted in the HPI.   Past Surgical History:  Procedure Laterality Date  . CARDIAC CATHETERIZATION    . CYSTOSCOPY W/ URETERAL STENT PLACEMENT Left 10/03/2013   Procedure: CYSTOSCOPY WITH RETROGRADE PYELOGRAM/URETERAL STENT PLACEMENT;  Surgeon: Sharyn Creamer, MD;  [facility];  Service: Urology;  Laterality: Left;   . CYSTOSCOPY W/ URETERAL STENT PLACEMENT Left 03/15/2014   Procedure: CYSTOSCOPY WITH RETROGRADE PYELOGRAM/URETERAL STENT PLACEMENT;  Surgeon: Alexis Frock, MD;  [facility];  Service: Urology;  Laterality: Left;  . CYSTOSCOPY WITH RETROGRADE PYELOGRAM, URETEROSCOPY AND STENT PLACEMENT Bilateral 11/14/2013   Procedure: CYSTOSCOPY WITH BILATERAL RETROGRADE PYELOGRAM, LEFT URETEROSCOPY WITH BIOPSY  AND STENT EXCHANGE;  Surgeon: Alexis Frock, MD;  [facility];  Service: Urology;  Laterality: Bilateral;  . HERNIA REPAIR  2010   right inguinal  . ICD  09/16/2011  . IMPLANTABLE CARDIOVERTER DEFIBRILLATOR IMPLANT N/A 09/16/2011   Procedure: IMPLANTABLE CARDIOVERTER DEFIBRILLATOR IMPLANT;  Surgeon: Evans Lance, MD;  Location: [facility] CATH LAB;  Service: Cardiovascular;  Laterality: N/A;  . kidney removed  03/17/2014  . pt had ear surgery    . ROBOT ASSITED LAPAROSCOPIC NEPHROURETERECTOMY Left 03/15/2014   Procedure: ROBOT ASSITED LAPAROSCOPIC NEPHROURETERECTOMY;  Surgeon: Alexis Frock, MD;  [facility];  Service: Urology;  Laterality: Left;  . TRANSURETHRAL RESECTION OF BLADDER TUMOR WITH GYRUS (TURBT-GYRUS) N/A 10/03/2013   Procedure: TRANSURETHRAL RESECTION OF BLADDER TUMOR WITH GYRUS (TURBT-GYRUS);  Surgeon: Sharyn Creamer, MD;  [facility];  Service: Urology;  Laterality: N/A;  . TRANSURETHRAL RESECTION OF BLADDER TUMOR WITH GYRUS (TURBT-GYRUS) N/A 11/14/2013   Procedure: TRANSURETHRAL RESECTION OF BLADDER TUMOR WITH  GYRUS (TURBT-GYRUS);  Surgeon: Alexis Frock, MD;  [facility];  Service: Urology;  Laterality: N/A;     Family History  Problem Relation Age of Onset  . Cancer Father        prostate  . Heart Problems Maternal Uncle   . Cancer Paternal Uncle        lung  . Cancer Paternal Grandmother        lung  . Cancer Brother        lung  . Heart failure Unknown      Social History   Socioeconomic History  . Marital status: Married    Spouse name: Not  on file  . Number of children: Not on file  . Years of education: Not on file  . Highest education level: Not on file  Occupational History  . Not on file  Social Needs  . Financial resource strain: Not on file  . Food insecurity    Worry: Not on file    Inability: Not on file  . Transportation needs    Medical: Not on file    Non-medical: Not on file  Tobacco Use  . Smoking status: Former Smoker    Packs/day: 0.50    Years: 6.00    Pack years: 3.00    Types: Cigarettes    Quit date: 09/16/1971    Years since quitting: 47.4  . Smokeless tobacco: Never Used  Substance and Sexual Activity  . Alcohol use: No  . Drug use: No  . Sexual activity: Yes  Lifestyle  . Physical activity    Days per week: Not on file    Minutes per session: Not on file  . Stress: Not on file  Relationships  . Social Herbalist on phone: Not on file    Gets together: Not on file    Attends religious service: Not on file    Active member of club or organization: Not on file    Attends meetings of clubs or organizations: Not on file    Relationship status: Not on file  . Intimate partner violence    Fear of current or ex partner: Not on file    Emotionally abused: Not on file    Physically abused: Not on file    Forced sexual activity: Not on file  Other Topics Concern  . Not on file  Social History Narrative  . Not on file     BP 120/82   Pulse 68   Ht 5\' 8"  (1.727 m)   Wt 170 lb (77.1 kg)   SpO2 91%   BMI 25.85 kg/m   Physical Exam:  Well appearing NAD HEENT: Unremarkable Neck:  No JVD, no thyromegally Lymphatics:  No adenopathy Back:  No CVA tenderness Lungs:  Clear with no wheezes HEART:  Regular rate rhythm, no murmurs, no rubs, no clicks Abd:  soft, positive bowel sounds, no organomegally, no rebound, no guarding Ext:  2 plus pulses, no edema, no cyanosis, no clubbing Skin:  No rashes no nodules Neuro:  CN II through XII intact, motor grossly intact  EKG - NSR  with septal MI  DEVICE  Normal device function.  See PaceArt for details.   Assess/Plan: 1. Chronic systolic heart failure - his symptoms are class 2. He will continue his current meds. 2. ICD - his [employer] single chamber ICD is working normally. We will recheck in several months. 3. CAD - he is s/p anterior MI. He denies anginal  symptoms.   Mikle Bosworth.D.

## 2019-02-21 NOTE — Patient Instructions (Signed)

## 2019-02-25 LAB — CUP PACEART INCLINIC DEVICE CHECK
Battery Remaining Longevity: 33 mo
Brady Statistic RV Percent Paced: 0 %
Date Time Interrogation Session: 20201125174100
HighPow Impedance: 75.375
Implantable Lead Implant Date: 20130620
Implantable Lead Location: 753860
Implantable Lead Model: 181
Implantable Lead Serial Number: 316507
Implantable Pulse Generator Implant Date: 20130620
Lead Channel Impedance Value: 525 Ohm
Lead Channel Pacing Threshold Amplitude: 0.75 V
Lead Channel Pacing Threshold Pulse Width: 0.5 ms
Lead Channel Sensing Intrinsic Amplitude: 12 mV
Lead Channel Setting Pacing Amplitude: 2.5 V
Lead Channel Setting Pacing Pulse Width: 0.5 ms
Lead Channel Setting Sensing Sensitivity: 0.5 mV
Pulse Gen Serial Number: 1033528

## 2019-02-26 NOTE — Addendum Note (Signed)
Addended by: Rose Phi on: 02/26/2019 04:36 PM   Modules accepted: Orders

## 2019-03-02 NOTE — Progress Notes (Deleted)
HPI: FU coronary artery disease. He had an acute anterior infarct in 2003. At that time, he had PCI of his LAD and diagonal. Note, the Lcx and RCA had no disease. Carotid Dopplers in June of 2009 showed normal carotids. Echocardiogram in May of 2013 showed an ejection fraction of 30-35%. There was mild mitral regurgitation and trace aortic insufficiency. There was mild left atrial enlargement. Patient had ICD placed in June of 2013. Nuclear study December 2018 showed ejection fraction 25%.  There was prior infarct but no ischemia.  Since last seen,  Current Outpatient Medications  Medication Sig Dispense Refill  . aspirin EC 81 MG tablet Take 1 tablet (81 mg total) by mouth daily. 90 tablet 3  . atorvastatin (LIPITOR) 80 MG tablet Take 1 tablet (80 mg total) by mouth daily. Please call and schedule an appt for further refills 90 tablet 0  . carvedilol (COREG) 12.5 MG tablet Take 1 tablet (12.5 mg total) by mouth 2 (two) times daily with a meal. 180 tablet 0  . CIALIS 20 MG tablet     . ramipril (ALTACE) 5 MG capsule TAKE 1 CAPSULE BY MOUTH  TWICE DAILY need office visit 180 capsule 3  . riluzole (RILUTEK) 50 MG tablet Take 50 mg by mouth 2 (two) times daily.     No current facility-administered medications for this visit.      Past Medical History:  Diagnosis Date  . AICD (automatic cardioverter/defibrillator) present   . 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 Anginal pain (HCC)    NO RECENT ANGINA  . Bladder cancer ([place])   . CAD   . CARDIOMYOPATHY, ISCHEMIC   . CHF   . Chronic kidney disease   . HYPERLIPIDEMIA   . HYPERTENSION   . 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 Left ventricular dysfunction   . Myocardial infarction [facility]) 2003    Past Surgical History:  Procedure Laterality Date  . CARDIAC CATHETERIZATION    . CYSTOSCOPY W/ URETERAL STENT PLACEMENT Left 10/03/2013   Procedure: CYSTOSCOPY WITH RETROGRADE PYELOGRAM/URETERAL  STENT PLACEMENT;  Surgeon: Sharyn Creamer, MD;  [facility];  Service: Urology;  Laterality: Left;  . CYSTOSCOPY W/ URETERAL STENT PLACEMENT Left 03/15/2014   Procedure: CYSTOSCOPY WITH RETROGRADE PYELOGRAM/URETERAL STENT PLACEMENT;  Surgeon: Alexis Frock, MD;  [facility];  Service: Urology;  Laterality: Left;  . CYSTOSCOPY WITH RETROGRADE PYELOGRAM, URETEROSCOPY AND STENT PLACEMENT Bilateral 11/14/2013   Procedure: CYSTOSCOPY WITH BILATERAL RETROGRADE PYELOGRAM, LEFT URETEROSCOPY WITH BIOPSY  AND STENT EXCHANGE;  Surgeon: Alexis Frock, MD;  [facility];  Service: Urology;  Laterality: Bilateral;  . HERNIA REPAIR  2010   right inguinal  . ICD  09/16/2011  . IMPLANTABLE CARDIOVERTER DEFIBRILLATOR IMPLANT N/A 09/16/2011   Procedure: IMPLANTABLE CARDIOVERTER DEFIBRILLATOR IMPLANT;  Surgeon: Evans Lance, MD;  Location: [facility] CATH LAB;  Service: Cardiovascular;  Laterality: N/A;  . kidney removed  03/17/2014  . pt had ear surgery    . ROBOT ASSITED LAPAROSCOPIC NEPHROURETERECTOMY Left 03/15/2014   Procedure: ROBOT ASSITED LAPAROSCOPIC NEPHROURETERECTOMY;  Surgeon: Alexis Frock, MD;  [facility];  Service: Urology;  Laterality: Left;  . TRANSURETHRAL RESECTION OF BLADDER TUMOR WITH GYRUS (TURBT-GYRUS) N/A 10/03/2013   Procedure: TRANSURETHRAL RESECTION OF BLADDER TUMOR WITH GYRUS (TURBT-GYRUS);  Surgeon: Sharyn Creamer, MD;  [facility];  Service: Urology;  Laterality: N/A;  . TRANSURETHRAL RESECTION OF BLADDER TUMOR WITH GYRUS (TURBT-GYRUS) N/A 11/14/2013   Procedure:  TRANSURETHRAL RESECTION OF BLADDER TUMOR WITH GYRUS (TURBT-GYRUS);  Surgeon: Alexis Frock, MD;  [facility];  Service: Urology;  Laterality: N/A;    Social History   Socioeconomic History  . Marital status: Married    Spouse name: Not on file  . Number of children: Not on file  . Years of education: Not on file  . Highest education level: Not on file  Occupational History  . Not on file   Social Needs  . Financial resource strain: Not on file  . Food insecurity    Worry: Not on file    Inability: Not on file  . Transportation needs    Medical: Not on file    Non-medical: Not on file  Tobacco Use  . Smoking status: Former Smoker    Packs/day: 0.50    Years: 6.00    Pack years: 3.00    Types: Cigarettes    Quit date: 09/16/1971    Years since quitting: 47.4  . Smokeless tobacco: Never Used  Substance and Sexual Activity  . Alcohol use: No  . Drug use: No  . Sexual activity: Yes  Lifestyle  . Physical activity    Days per week: Not on file    Minutes per session: Not on file  . Stress: Not on file  Relationships  . Social Herbalist on phone: Not on file    Gets together: Not on file    Attends religious service: Not on file    Active member of club or organization: Not on file    Attends meetings of clubs or organizations: Not on file    Relationship status: Not on file  . Intimate partner violence    Fear of current or ex partner: Not on file    Emotionally abused: Not on file    Physically abused: Not on file    Forced sexual activity: Not on file  Other Topics Concern  . Not on file  Social History Narrative  . Not on file    Family History  Problem Relation Age of Onset  . Cancer Father        prostate  . Heart Problems Maternal Uncle   . Cancer Paternal Uncle        lung  . Cancer Paternal Grandmother        lung  . Cancer Brother        lung  . Heart failure Unknown     ROS: no fevers or chills, productive cough, hemoptysis, dysphasia, odynophagia, melena, hematochezia, dysuria, hematuria, rash, seizure activity, orthopnea, PND, pedal edema, claudication. Remaining systems are negative.  Physical Exam: Well-developed well-nourished in no acute distress.  Skin is warm and dry.  HEENT is normal.  Neck is supple.  Chest is clear to auscultation with normal expansion.  Cardiovascular exam is regular rate and rhythm.   Abdominal exam nontender or distended. No masses palpated. Extremities show no edema. neuro grossly intact  ECG- personally reviewed  A/P  1 coronary artery disease-no recurrent chest pain.  Most recent nuclear study showed no ischemia.  Continue medical therapy with aspirin and statin.  2 prior ICD-managed by Dr. Lovena Le.  3 hypertension-blood pressure is controlled.  Continue present medications and follow.  4 hyperlipidemia-continue statin.  Check lipids and liver.  5 ischemic cardiomyopathy-continue present dose of beta-blocker and ACE inhibitor.  Check potassium and renal function.  6 ALS-Per neurology.  Kirk Ruths, MD

## 2019-03-07 ENCOUNTER — Ambulatory Visit (INDEPENDENT_AMBULATORY_CARE_PROVIDER_SITE_OTHER): Payer: Medicare Other

## 2019-03-07 DIAGNOSIS — Z95810 Presence of automatic (implantable) cardiac defibrillator: Secondary | ICD-10-CM | POA: Diagnosis not present

## 2019-03-07 DIAGNOSIS — I5022 Chronic systolic (congestive) heart failure: Secondary | ICD-10-CM

## 2019-03-07 NOTE — Progress Notes (Signed)
EPIC Encounter for ICM Monitoring  Patient Name: [name] [name] is a 63 y.o. male Date: 03/07/2019 Primary Care Physican: Curly Rim, MD Primary Cardiologist:Crenshaw Electrophysiologist:Taylor Last Weight:174lbs  Transmission reviewed.   Corvue thoracic impedance normal.  No diuretic  Recommendations:None  Follow-up plan: ICM clinic phone appointment on1/18/2021. 91 day device clinic remote transmission 05/23/2019. OV with Dr Dr [name] on 04/26/2019.  Copy of ICM check sent to Dr.Taylor.   3 month ICM trend: 03/07/2019    1 Year ICM trend:       Rosalene Billings, RN 03/07/2019 4:47 PM

## 2019-03-09 ENCOUNTER — Ambulatory Visit: Payer: Medicare Other | Admitting: Cardiology

## 2019-03-20 NOTE — Progress Notes (Signed)
ICD remote 

## 2019-04-16 ENCOUNTER — Ambulatory Visit (INDEPENDENT_AMBULATORY_CARE_PROVIDER_SITE_OTHER): Payer: Medicare Other

## 2019-04-16 DIAGNOSIS — I5022 Chronic systolic (congestive) heart failure: Secondary | ICD-10-CM

## 2019-04-16 DIAGNOSIS — Z95810 Presence of automatic (implantable) cardiac defibrillator: Secondary | ICD-10-CM | POA: Diagnosis not present

## 2019-04-20 ENCOUNTER — Telehealth: Payer: Self-pay

## 2019-04-20 NOTE — Telephone Encounter (Signed)
Remote ICM transmission received.  Attempted call to patient regarding ICM remote transmission and left detailed message per DPR.  Advised to return call for any fluid symptoms or questions.  

## 2019-04-20 NOTE — Progress Notes (Signed)
EPIC Encounter for ICM Monitoring  Patient Name: [name] [name] is a 64 y.o. male Date: 04/20/2019 Primary Care Physican: Corrington, Delsa Grana, MD Primary Cardiologist:Crenshaw Electrophysiologist:Taylor Last Weight:174lbs  Attempted call to patient and unable to reach.  Left detailed message per DPR regarding transmission. Transmission reviewed.   Corvue thoracic impedance normal but suggest possible fluid accumulation from  1/10 - 1/14 and 1/15 - 1/19.  No diuretic  Recommendations:Left voice mail with ICM number and encouraged to call if experiencing any fluid symptoms.  Follow-up plan: ICM clinic phone appointment on2/25/2021. 91 day device clinic remote transmission 05/23/2019. OV with Dr Dr [name] on 04/26/2019.  Copy of ICM check sent to Dr.Taylor.  3 month ICM trend: 04/18/2019      Rosalene Billings, RN 04/20/2019 8:13 AM

## 2019-04-23 NOTE — Progress Notes (Deleted)
HPI: FU coronary artery disease. He had an acute anterior infarct in 2003. At that time, he had PCI of his LAD and diagonal. Note, the Lcx and RCA had no disease. Carotid Dopplers in June of 2009 showed normal carotids. Echocardiogram in May of 2013 showed an ejection fraction of 30-35%. There was mild mitral regurgitation and trace aortic insufficiency. There was mild left atrial enlargement. Patient had ICD placed in June of 2013. Nuclear study December 2018 showed ejection fraction 25%.  There was prior infarct but no ischemia.  Since last seen,   Current Outpatient Medications  Medication Sig Dispense Refill  . aspirin EC 81 MG tablet Take 1 tablet (81 mg total) by mouth daily. 90 tablet 3  . atorvastatin (LIPITOR) 80 MG tablet Take 1 tablet (80 mg total) by mouth daily. Please call and schedule an appt for further refills 90 tablet 0  . carvedilol (COREG) 12.5 MG tablet Take 1 tablet (12.5 mg total) by mouth 2 (two) times daily with a meal. 180 tablet 0  . CIALIS 20 MG tablet     . ramipril (ALTACE) 5 MG capsule TAKE 1 CAPSULE BY MOUTH  TWICE DAILY need office visit 180 capsule 3  . riluzole (RILUTEK) 50 MG tablet Take 50 mg by mouth 2 (two) times daily.     No current facility-administered medications for this visit.     Past Medical History:  Diagnosis Date  . AICD (automatic cardioverter/defibrillator) present   . 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 Anginal pain (HCC)    NO RECENT ANGINA  . Bladder cancer ([place])   . CAD   . CARDIOMYOPATHY, ISCHEMIC   . CHF   . Chronic kidney disease   . HYPERLIPIDEMIA   . HYPERTENSION   . 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 Left ventricular dysfunction   . Myocardial infarction [facility]) 2003    Past Surgical History:  Procedure Laterality Date  . CARDIAC CATHETERIZATION    . CYSTOSCOPY W/ URETERAL STENT PLACEMENT Left 10/03/2013   Procedure: CYSTOSCOPY WITH RETROGRADE PYELOGRAM/URETERAL  STENT PLACEMENT;  Surgeon: Sharyn Creamer, MD;  [facility];  Service: Urology;  Laterality: Left;  . CYSTOSCOPY W/ URETERAL STENT PLACEMENT Left 03/15/2014   Procedure: CYSTOSCOPY WITH RETROGRADE PYELOGRAM/URETERAL STENT PLACEMENT;  Surgeon: Alexis Frock, MD;  [facility];  Service: Urology;  Laterality: Left;  . CYSTOSCOPY WITH RETROGRADE PYELOGRAM, URETEROSCOPY AND STENT PLACEMENT Bilateral 11/14/2013   Procedure: CYSTOSCOPY WITH BILATERAL RETROGRADE PYELOGRAM, LEFT URETEROSCOPY WITH BIOPSY  AND STENT EXCHANGE;  Surgeon: Alexis Frock, MD;  [facility];  Service: Urology;  Laterality: Bilateral;  . HERNIA REPAIR  2010   right inguinal  . ICD  09/16/2011  . IMPLANTABLE CARDIOVERTER DEFIBRILLATOR IMPLANT N/A 09/16/2011   Procedure: IMPLANTABLE CARDIOVERTER DEFIBRILLATOR IMPLANT;  Surgeon: Evans Lance, MD;  Location: [facility] CATH LAB;  Service: Cardiovascular;  Laterality: N/A;  . kidney removed  03/17/2014  . pt had ear surgery    . ROBOT ASSITED LAPAROSCOPIC NEPHROURETERECTOMY Left 03/15/2014   Procedure: ROBOT ASSITED LAPAROSCOPIC NEPHROURETERECTOMY;  Surgeon: Alexis Frock, MD;  [facility];  Service: Urology;  Laterality: Left;  . TRANSURETHRAL RESECTION OF BLADDER TUMOR WITH GYRUS (TURBT-GYRUS) N/A 10/03/2013   Procedure: TRANSURETHRAL RESECTION OF BLADDER TUMOR WITH GYRUS (TURBT-GYRUS);  Surgeon: Sharyn Creamer, MD;  [facility];  Service: Urology;  Laterality: N/A;  . TRANSURETHRAL RESECTION OF BLADDER TUMOR WITH GYRUS (TURBT-GYRUS) N/A 11/14/2013   Procedure:  TRANSURETHRAL RESECTION OF BLADDER TUMOR WITH GYRUS (TURBT-GYRUS);  Surgeon: Alexis Frock, MD;  [facility];  Service: Urology;  Laterality: N/A;    Social History   Socioeconomic History  . Marital status: Married    Spouse name: Not on file  . Number of children: Not on file  . Years of education: Not on file  . Highest education level: Not on file  Occupational History  . Not on file    Tobacco Use  . Smoking status: Former Smoker    Packs/day: 0.50    Years: 6.00    Pack years: 3.00    Types: Cigarettes    Quit date: 09/16/1971    Years since quitting: 47.6  . Smokeless tobacco: Never Used  Substance and Sexual Activity  . Alcohol use: No  . Drug use: No  . Sexual activity: Yes  Other Topics Concern  . Not on file  Social History Narrative  . Not on file   Social Determinants of Health   Financial Resource Strain:   . Difficulty of Paying Living Expenses: Not on file  Food Insecurity:   . Worried About Charity fundraiser in the Last Year: Not on file  . Ran Out of Food in the Last Year: Not on file  Transportation Needs:   . Lack of Transportation (Medical): Not on file  . Lack of Transportation (Non-Medical): Not on file  Physical Activity:   . Days of Exercise per Week: Not on file  . Minutes of Exercise per Session: Not on file  Stress:   . Feeling of Stress : Not on file  Social Connections:   . Frequency of Communication with Friends and Family: Not on file  . Frequency of Social Gatherings with Friends and Family: Not on file  . Attends Religious Services: Not on file  . Active Member of Clubs or Organizations: Not on file  . Attends Archivist Meetings: Not on file  . Marital Status: Not on file  Intimate Partner Violence:   . Fear of Current or Ex-Partner: Not on file  . Emotionally Abused: Not on file  . Physically Abused: Not on file  . Sexually Abused: Not on file    Family History  Problem Relation Age of Onset  . Cancer Father        prostate  . Heart Problems Maternal Uncle   . Cancer Paternal Uncle        lung  . Cancer Paternal Grandmother        lung  . Cancer Brother        lung  . Heart failure Unknown     ROS: no fevers or chills, productive cough, hemoptysis, dysphasia, odynophagia, melena, hematochezia, dysuria, hematuria, rash, seizure activity, orthopnea, PND, pedal edema, claudication. Remaining  systems are negative.  Physical Exam: Well-developed well-nourished in no acute distress.  Skin is warm and dry.  HEENT is normal.  Neck is supple.  Chest is clear to auscultation with normal expansion.  Cardiovascular exam is regular rate and rhythm.  Abdominal exam nontender or distended. No masses palpated. Extremities show no edema. neuro grossly intact  ECG- personally reviewed  A/P  1 coronary artery disease-patient doing well with no chest pain.  Continue aspirin and statin.  2 hypertension-blood pressure controlled.  Continue present medications.  3 hyperlipidemia-continue statin.  4 prior ICD-managed by electrophysiology.  5 ischemic cardiomyopathy-continue ACE inhibitor and beta-blocker.  Kirk Ruths, MD

## 2019-04-26 ENCOUNTER — Ambulatory Visit: Payer: Medicare Other | Admitting: Cardiology

## 2019-04-30 ENCOUNTER — Ambulatory Visit (INDEPENDENT_AMBULATORY_CARE_PROVIDER_SITE_OTHER): Payer: Medicare Other | Admitting: Cardiology

## 2019-04-30 ENCOUNTER — Encounter: Payer: Self-pay | Admitting: Cardiology

## 2019-04-30 ENCOUNTER — Other Ambulatory Visit: Payer: Self-pay

## 2019-04-30 VITALS — BP 113/72 | HR 76 | Temp 97.2°F | Ht 68.0 in | Wt 172.0 lb

## 2019-04-30 DIAGNOSIS — E7800 Pure hypercholesterolemia, unspecified: Secondary | ICD-10-CM | POA: Diagnosis not present

## 2019-04-30 DIAGNOSIS — I2510 Atherosclerotic heart disease of native coronary artery without angina pectoris: Secondary | ICD-10-CM | POA: Diagnosis not present

## 2019-04-30 DIAGNOSIS — I10 Essential (primary) hypertension: Secondary | ICD-10-CM | POA: Diagnosis not present

## 2019-04-30 DIAGNOSIS — I255 Ischemic cardiomyopathy: Secondary | ICD-10-CM

## 2019-04-30 NOTE — Progress Notes (Signed)
HPI: FU coronary artery disease. He had an acute anterior infarct in 2003. At that time, he had PCI of his LAD and diagonal. Note, the Lcx and RCA had no disease. Carotid Dopplers in June of 2009 showed normal carotids. Echocardiogram in May of 2013 showed an ejection fraction of 30-35%. There was mild mitral regurgitation and trace aortic insufficiency. There was mild left atrial enlargement. Patient had ICD placed in June of 2013. Nuclear study December 2018 showed ejection fraction 25%.  There was prior infarct but no ischemia.  Since last seen,there is no dyspnea, chest pain, palpitations or syncope.   Current Outpatient Medications  Medication Sig Dispense Refill  . aspirin EC 81 MG tablet Take 1 tablet (81 mg total) by mouth daily. 90 tablet 3  . atorvastatin (LIPITOR) 80 MG tablet Take 1 tablet (80 mg total) by mouth daily. Please call and schedule an appt for further refills 90 tablet 0  . carvedilol (COREG) 12.5 MG tablet Take 1 tablet (12.5 mg total) by mouth 2 (two) times daily with a meal. 180 tablet 0  . CIALIS 20 MG tablet     . ramipril (ALTACE) 5 MG capsule TAKE 1 CAPSULE BY MOUTH  TWICE DAILY need office visit 180 capsule 3  . riluzole (RILUTEK) 50 MG tablet Take 50 mg by mouth 2 (two) times daily.     No current facility-administered medications for this visit.     Past Medical History:  Diagnosis Date  . AICD (automatic cardioverter/defibrillator) present   . 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 Anginal pain ([facility])    NO RECENT ANGINA  . Bladder cancer ([place])   . CAD   . CARDIOMYOPATHY, ISCHEMIC   . CHF   . Chronic kidney disease   . HYPERLIPIDEMIA   . HYPERTENSION   . 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 Left ventricular dysfunction   . Myocardial infarction [facility]) 2003    Past Surgical History:  Procedure Laterality Date  . CARDIAC CATHETERIZATION    . CYSTOSCOPY W/ URETERAL STENT PLACEMENT Left 10/03/2013   Procedure: CYSTOSCOPY WITH RETROGRADE PYELOGRAM/URETERAL STENT PLACEMENT;  Surgeon: Sharyn Creamer, MD;  Location: [facility];  Service: Urology;  Laterality: Left;  . CYSTOSCOPY W/ URETERAL STENT PLACEMENT Left 03/15/2014   Procedure: CYSTOSCOPY WITH RETROGRADE PYELOGRAM/URETERAL STENT PLACEMENT;  Surgeon: Alexis Frock, MD;  Location: [facility];  Service: Urology;  Laterality: Left;  . CYSTOSCOPY WITH RETROGRADE PYELOGRAM, URETEROSCOPY AND STENT PLACEMENT Bilateral 11/14/2013   Procedure: CYSTOSCOPY WITH BILATERAL RETROGRADE PYELOGRAM, LEFT URETEROSCOPY WITH BIOPSY  AND STENT EXCHANGE;  Surgeon: Alexis Frock, MD;  Location: [facility];  Service: Urology;  Laterality: Bilateral;  . HERNIA REPAIR  2010   right inguinal  . ICD  09/16/2011  . IMPLANTABLE CARDIOVERTER DEFIBRILLATOR IMPLANT N/A 09/16/2011   Procedure: IMPLANTABLE CARDIOVERTER DEFIBRILLATOR IMPLANT;  Surgeon: Evans Lance, MD;  Location: [facility] CATH LAB;  Service: Cardiovascular;  Laterality: N/A;  . kidney removed  03/17/2014  . pt had ear surgery    . ROBOT ASSITED LAPAROSCOPIC NEPHROURETERECTOMY Left 03/15/2014   Procedure: ROBOT ASSITED LAPAROSCOPIC NEPHROURETERECTOMY;  Surgeon: Alexis Frock, MD;  Location: [facility];  Service: Urology;  Laterality: Left;  . TRANSURETHRAL RESECTION OF BLADDER TUMOR WITH GYRUS (TURBT-GYRUS) N/A 10/03/2013   Procedure: TRANSURETHRAL RESECTION OF BLADDER TUMOR WITH GYRUS (TURBT-GYRUS);  Surgeon: Sharyn Creamer, MD;  Location: [facility];  Service: Urology;  Laterality: N/A;  . TRANSURETHRAL RESECTION OF BLADDER TUMOR WITH  GYRUS (TURBT-GYRUS) N/A 11/14/2013   Procedure: TRANSURETHRAL RESECTION OF BLADDER TUMOR WITH GYRUS (TURBT-GYRUS);  Surgeon: Alexis Frock, MD;  Location: [facility];  Service: Urology;  Laterality: N/A;    Social History   Socioeconomic History  . Marital status: Married    Spouse name: Not on file  . Number of children: Not on file  . Years of education: Not on file  . Highest education  level: Not on file  Occupational History  . Not on file  Tobacco Use  . Smoking status: Former Smoker    Packs/day: 0.50    Years: 6.00    Pack years: 3.00    Types: Cigarettes    Quit date: 09/16/1971    Years since quitting: 47.6  . Smokeless tobacco: Never Used  Substance and Sexual Activity  . Alcohol use: No  . Drug use: No  . Sexual activity: Yes  Other Topics Concern  . Not on file  Social History Narrative  . Not on file   Social Determinants of Health   Financial Resource Strain:   . Difficulty of Paying Living Expenses: Not on file  Food Insecurity:   . Worried About Charity fundraiser in the Last Year: Not on file  . Ran Out of Food in the Last Year: Not on file  Transportation Needs:   . Lack of Transportation (Medical): Not on file  . Lack of Transportation (Non-Medical): Not on file  Physical Activity:   . Days of Exercise per Week: Not on file  . Minutes of Exercise per Session: Not on file  Stress:   . Feeling of Stress : Not on file  Social Connections:   . Frequency of Communication with Friends and Family: Not on file  . Frequency of Social Gatherings with Friends and Family: Not on file  . Attends Religious Services: Not on file  . Active Member of Clubs or Organizations: Not on file  . Attends Archivist Meetings: Not on file  . Marital Status: Not on file  Intimate Partner Violence:   . Fear of Current or Ex-Partner: Not on file  . Emotionally Abused: Not on file  . Physically Abused: Not on file  . Sexually Abused: Not on file    Family History  Problem Relation Age of Onset  . Cancer Father        prostate  . Heart Problems Maternal Uncle   . Cancer Paternal Uncle        lung  . Cancer Paternal Grandmother        lung  . Cancer Brother        lung  . Heart failure Unknown     ROS: Progressive weakness from ALS but no fevers or chills, productive cough, hemoptysis, dysphasia, odynophagia, melena, hematochezia, dysuria,  hematuria, rash, seizure activity, orthopnea, PND, pedal edema, claudication. Remaining systems are negative.  Physical Exam: Well-developed well-nourished in no acute distress.  Skin is warm and dry.  HEENT is normal.  Neck is supple.  Chest is clear to auscultation with normal expansion.  Cardiovascular exam is regular rate and rhythm.  Abdominal exam nontender or distended. No masses palpated. Extremities show no edema. neuro weakness and lower extremities bilaterally and to a lesser extent upper extremities.  A/P  1 coronary artery disease-patient doing well with no chest pain.  Continue aspirin and statin.  2 hypertension-blood pressure controlled.  Continue present medications.  Renal function monitored by primary care.  3 hyperlipidemia-continue statin.  Lipids  and liver monitored by primary care.  4 prior ICD-managed by electrophysiology.  5 ischemic cardiomyopathy-continue ACE inhibitor and beta-blocker.  6 ALS-Per neurology  Kirk Ruths, MD

## 2019-04-30 NOTE — Patient Instructions (Signed)

## 2019-05-23 ENCOUNTER — Ambulatory Visit (INDEPENDENT_AMBULATORY_CARE_PROVIDER_SITE_OTHER): Payer: Medicare Other | Admitting: *Deleted

## 2019-05-23 DIAGNOSIS — Z95810 Presence of automatic (implantable) cardiac defibrillator: Secondary | ICD-10-CM

## 2019-05-23 LAB — CUP PACEART REMOTE DEVICE CHECK
Battery Remaining Longevity: 25 mo
Battery Remaining Percentage: 24 %
Battery Voltage: 2.8 V
Brady Statistic RV Percent Paced: 1 %
Date Time Interrogation Session: 20210224151303
HighPow Impedance: 66 Ohm
HighPow Impedance: 66 Ohm
Implantable Lead Implant Date: 20130620
Implantable Lead Location: 753860
Implantable Lead Model: 181
Implantable Lead Serial Number: 316507
Implantable Pulse Generator Implant Date: 20130620
Lead Channel Impedance Value: 450 Ohm
Lead Channel Pacing Threshold Amplitude: 0.75 V
Lead Channel Pacing Threshold Pulse Width: 0.5 ms
Lead Channel Sensing Intrinsic Amplitude: 11.7 mV
Lead Channel Setting Pacing Amplitude: 2.5 V
Lead Channel Setting Pacing Pulse Width: 0.5 ms
Lead Channel Setting Sensing Sensitivity: 0.5 mV
Pulse Gen Serial Number: 1033528

## 2019-05-24 ENCOUNTER — Ambulatory Visit (INDEPENDENT_AMBULATORY_CARE_PROVIDER_SITE_OTHER): Payer: Medicare Other

## 2019-05-24 DIAGNOSIS — Z95810 Presence of automatic (implantable) cardiac defibrillator: Secondary | ICD-10-CM | POA: Diagnosis not present

## 2019-05-24 DIAGNOSIS — I5022 Chronic systolic (congestive) heart failure: Secondary | ICD-10-CM

## 2019-05-24 NOTE — Progress Notes (Signed)
ICD Remote  

## 2019-05-25 NOTE — Progress Notes (Signed)
EPIC Encounter for ICM Monitoring  Patient Name: [name] [name] is a 64 y.o. male Date: 05/25/2019 Primary Care Physican: Curly Rim, MD Primary Cardiologist:Crenshaw Electrophysiologist:Taylor Last Weight:174lbs  Transmission reviewed.   Corvue thoracic impedance normal..  No diuretic  Recommendations:None  Follow-up plan: ICM clinic phone appointment on3/29/2021. 91 day device clinic remote transmission5/26/2021.   Copy of ICM check sent to Dr.Taylor.  3 month ICM trend: 05/23/2019    1 Year ICM trend:       Rosalene Billings, RN 05/25/2019 4:48 PM

## 2019-05-29 DIAGNOSIS — C801 Malignant (primary) neoplasm, unspecified: Secondary | ICD-10-CM | POA: Insufficient documentation

## 2019-06-03 ENCOUNTER — Other Ambulatory Visit: Payer: Self-pay | Admitting: Cardiology

## 2019-06-03 DIAGNOSIS — I2510 Atherosclerotic heart disease of native coronary artery without angina pectoris: Secondary | ICD-10-CM

## 2019-06-25 ENCOUNTER — Ambulatory Visit (INDEPENDENT_AMBULATORY_CARE_PROVIDER_SITE_OTHER): Payer: Medicare Other

## 2019-06-25 DIAGNOSIS — I5022 Chronic systolic (congestive) heart failure: Secondary | ICD-10-CM | POA: Diagnosis not present

## 2019-06-25 DIAGNOSIS — Z95810 Presence of automatic (implantable) cardiac defibrillator: Secondary | ICD-10-CM

## 2019-06-27 NOTE — Progress Notes (Signed)
EPIC Encounter for ICM Monitoring  Patient Name: [name] [name] is a 64 y.o. male Date: 06/27/2019 Primary Care Physican: Curly Rim, MD Primary Cardiologist:Crenshaw Electrophysiologist:Taylor Last Weight:174lbs  Transmission reviewed.  Corvue thoracic impedance normal.  No diuretic  Recommendations:None  Follow-up plan: ICM clinic phone appointment on5/05/2019. 91 day device clinic remote transmission5/26/2021.   Copy of ICM check sent to Dr.Taylor.  3 month direct trend through 3/30   3 month ICM trend: 06/25/2019    1 Year ICM trend:        Rosalene Billings, RN 06/27/2019 1:53 PM

## 2019-07-20 ENCOUNTER — Other Ambulatory Visit: Payer: Self-pay | Admitting: Cardiology

## 2019-07-20 DIAGNOSIS — I2510 Atherosclerotic heart disease of native coronary artery without angina pectoris: Secondary | ICD-10-CM

## 2019-07-20 NOTE — Telephone Encounter (Signed)
Rx(s) sent to pharmacy electronically.  

## 2019-07-31 ENCOUNTER — Telehealth: Payer: Self-pay

## 2019-07-31 NOTE — Telephone Encounter (Signed)
Spoke with patient to remind of missed remote transmission 

## 2019-08-01 NOTE — Progress Notes (Signed)
No ICM remote transmission received for 07/30/2019 and next ICM transmission scheduled for 08/23/2019.

## 2019-08-22 ENCOUNTER — Ambulatory Visit (INDEPENDENT_AMBULATORY_CARE_PROVIDER_SITE_OTHER): Payer: Medicare Other | Admitting: *Deleted

## 2019-08-22 DIAGNOSIS — I255 Ischemic cardiomyopathy: Secondary | ICD-10-CM

## 2019-08-23 LAB — CUP PACEART REMOTE DEVICE CHECK
Date Time Interrogation Session: 20210527105334
Implantable Lead Implant Date: 20130620
Implantable Lead Location: 753860
Implantable Lead Model: 181
Implantable Lead Serial Number: 316507
Implantable Pulse Generator Implant Date: 20130620
Pulse Gen Serial Number: 1033528

## 2019-08-24 ENCOUNTER — Ambulatory Visit (INDEPENDENT_AMBULATORY_CARE_PROVIDER_SITE_OTHER): Payer: Medicare Other

## 2019-08-24 DIAGNOSIS — Z95810 Presence of automatic (implantable) cardiac defibrillator: Secondary | ICD-10-CM | POA: Diagnosis not present

## 2019-08-24 DIAGNOSIS — I5022 Chronic systolic (congestive) heart failure: Secondary | ICD-10-CM

## 2019-08-24 NOTE — Progress Notes (Signed)
Remote ICD transmission.   

## 2019-08-24 NOTE — Progress Notes (Signed)
EPIC Encounter for ICM Monitoring  Patient Name: [name] [name] is a 64 y.o. male Date: 08/24/2019 Primary Care Physican: Corrington, Delsa Grana, MD Primary Cardiologist:Crenshaw Electrophysiologist:Taylor Last Weight:174lbs  Attempted call to patient and unable to reach.  Left detailed message per DPR regarding transmission. Transmission reviewed.   Corvue thoracic impedance suggesting possible ongoing fluid accumulation since 08/20/2019.  No diuretic  Recommendations:Unable to reach but left message to return call for any symptoms. Advised to limit salt intake.    Follow-up plan: ICM clinic phone appointment on6/09/2019 to recheck fluid levels. 91 day device clinic remote transmission8/25/2021.   Copy of ICM check sent to Dr.Taylor and Dr [name] for review.   3 month ICM trend: 08/22/2019    1 Year ICM trend:       Rosalene Billings, RN 08/24/2019 11:10 AM

## 2019-09-03 ENCOUNTER — Ambulatory Visit (INDEPENDENT_AMBULATORY_CARE_PROVIDER_SITE_OTHER): Payer: Medicare Other

## 2019-09-03 DIAGNOSIS — I5022 Chronic systolic (congestive) heart failure: Secondary | ICD-10-CM

## 2019-09-03 DIAGNOSIS — Z95810 Presence of automatic (implantable) cardiac defibrillator: Secondary | ICD-10-CM

## 2019-09-05 NOTE — Progress Notes (Signed)
EPIC Encounter for ICM Monitoring  Patient Name: [name] [name] is a 64 y.o. male Date: 09/05/2019 Primary Care Physican: Curly Rim, MD Primary Cardiologist:Crenshaw Electrophysiologist:Taylor Last Weight:174lbs  Transmission reviewed.   Corvue thoracic impedance returned to normal.  No diuretic  Recommendations: None.    Follow-up plan: ICM clinic phone appointment on6/28/2021. 91 day device clinic remote transmission8/25/2021.   Copy of ICM check sent to Dr.Taylor  3 month ICM trend: 09/03/2019    1 Year ICM trend:       Rosalene Billings, RN 09/05/2019 9:22 AM

## 2019-09-24 ENCOUNTER — Ambulatory Visit (INDEPENDENT_AMBULATORY_CARE_PROVIDER_SITE_OTHER): Payer: Medicare Other

## 2019-09-24 DIAGNOSIS — I5022 Chronic systolic (congestive) heart failure: Secondary | ICD-10-CM | POA: Diagnosis not present

## 2019-09-24 DIAGNOSIS — Z95810 Presence of automatic (implantable) cardiac defibrillator: Secondary | ICD-10-CM

## 2019-09-26 NOTE — Progress Notes (Signed)
EPIC Encounter for ICM Monitoring  Patient Name: [name] [name] is a 64 y.o. male Date: 09/26/2019 Primary Care Physican: Curly Rim, MD Primary Cardiologist:Crenshaw Electrophysiologist:Taylor 09/19/2019 Office Weight:174lbs  Transmission reviewed.  Corvue thoracic impedancenormal.  No diuretic  Recommendations: None.  Follow-up plan: ICM clinic phone appointment on8/04/2019. 91 day device clinic remote transmission8/25/2021.   Copy of ICM check sent to Dr.Taylor  3 month ICM trend: 09/24/2019    1 Year ICM trend:       Rosalene Billings, RN 09/26/2019 8:40 AM

## 2019-10-29 ENCOUNTER — Ambulatory Visit (INDEPENDENT_AMBULATORY_CARE_PROVIDER_SITE_OTHER): Payer: Medicare Other

## 2019-10-29 DIAGNOSIS — I5022 Chronic systolic (congestive) heart failure: Secondary | ICD-10-CM | POA: Diagnosis not present

## 2019-10-29 DIAGNOSIS — Z95810 Presence of automatic (implantable) cardiac defibrillator: Secondary | ICD-10-CM

## 2019-11-02 NOTE — Progress Notes (Signed)
EPIC Encounter for ICM Monitoring  Patient Name: [name] [name] is a 64 y.o. male Date: 11/02/2019 Primary Care Physican: Curly Rim, MD Primary Cardiologist:Crenshaw Electrophysiologist:Taylor 11/02/2019 Office Weight:174lbs  Spoke with patient and reports he has some fluid in his feet occasionally.  PCP prescribed Furosemide as needed.  Denies fluid symptoms.  He has ALS.  Corvue thoracic impedancenormal but was suggesting possible fluid accumulation from 7/20 - 7/28.  Prescribed: Furosemide 40 mg take 1 tablet daily but PCP that prescribed said he could take it as needed.    Recommendations:No changes and encouraged to call if experiencing any fluid symptoms.  Follow-up plan: ICM clinic phone appointment on 12/10/2019. 91 day device clinic remote transmission8/25/2021.   EP/Cardiology Office Visits: Recall for 03/05/2020 with Dr Lovena Le and 04/29/2020 with Dr. [name].    Copy of ICM check sent to Dr. Lovena Le.   3 month ICM trend: 10/29/2019    1 Year ICM trend:       Rosalene Billings, RN 11/02/2019 8:58 AM

## 2019-11-21 ENCOUNTER — Ambulatory Visit (INDEPENDENT_AMBULATORY_CARE_PROVIDER_SITE_OTHER): Payer: Medicare Other | Admitting: *Deleted

## 2019-11-21 DIAGNOSIS — I5022 Chronic systolic (congestive) heart failure: Secondary | ICD-10-CM

## 2019-11-21 DIAGNOSIS — I255 Ischemic cardiomyopathy: Secondary | ICD-10-CM

## 2019-11-23 LAB — CUP PACEART REMOTE DEVICE CHECK
Battery Remaining Longevity: 19 mo
Battery Remaining Percentage: 18 %
Battery Voltage: 2.75 V
Brady Statistic RV Percent Paced: 1 %
Date Time Interrogation Session: 20210825074125
HighPow Impedance: 81 Ohm
HighPow Impedance: 81 Ohm
Implantable Lead Implant Date: 20130620
Implantable Lead Location: 753860
Implantable Lead Model: 181
Implantable Lead Serial Number: 316507
Implantable Pulse Generator Implant Date: 20130620
Lead Channel Impedance Value: 530 Ohm
Lead Channel Pacing Threshold Amplitude: 0.75 V
Lead Channel Pacing Threshold Pulse Width: 0.5 ms
Lead Channel Sensing Intrinsic Amplitude: 12 mV
Lead Channel Setting Pacing Amplitude: 2.5 V
Lead Channel Setting Pacing Pulse Width: 0.5 ms
Lead Channel Setting Sensing Sensitivity: 0.5 mV
Pulse Gen Serial Number: 1033528

## 2019-11-26 NOTE — Progress Notes (Signed)
Remote ICD transmission.   

## 2019-12-10 ENCOUNTER — Ambulatory Visit (INDEPENDENT_AMBULATORY_CARE_PROVIDER_SITE_OTHER): Payer: Medicare Other

## 2019-12-10 DIAGNOSIS — Z95810 Presence of automatic (implantable) cardiac defibrillator: Secondary | ICD-10-CM

## 2019-12-10 DIAGNOSIS — I5022 Chronic systolic (congestive) heart failure: Secondary | ICD-10-CM

## 2019-12-14 NOTE — Progress Notes (Signed)
EPIC Encounter for ICM Monitoring  Patient Name: [name] [name] is a 64 y.o. male Date: 12/14/2019 Primary Care Physican: Curly Rim, MD Primary Cardiologist:Crenshaw Electrophysiologist:Taylor 12/14/2019 OfficeWeight:174lbs  Spoke with patient and reports he took PRN Furosemide during decreased impedance because he had some swelling in his feet.  He has ALS.  Corvue thoracic impedancenormal but was suggesting possible fluid accumulation from 8/27-9/7.  Prescribed: Furosemide 40 mg take 1 tablet daily but PCP that prescribed said he could take it as needed.    Recommendations:No changes and encouraged to call if experiencing any fluid symptoms.  Follow-up plan: ICM clinic phone appointment on 01/14/2020. 91 day device clinic remote transmission11/24/2021.   EP/Cardiology Office Visits: Recall for 03/05/2020 with Dr Lovena Le and 04/29/2020 with Dr. [name].    Copy of ICM check sent to Dr. Lovena Le.   3 month ICM trend: 12/10/2019    1 Year ICM trend:       Rosalene Billings, RN 12/14/2019 2:09 PM

## 2020-01-14 ENCOUNTER — Ambulatory Visit (INDEPENDENT_AMBULATORY_CARE_PROVIDER_SITE_OTHER): Payer: Medicare Other

## 2020-01-14 DIAGNOSIS — Z95810 Presence of automatic (implantable) cardiac defibrillator: Secondary | ICD-10-CM

## 2020-01-14 DIAGNOSIS — I5022 Chronic systolic (congestive) heart failure: Secondary | ICD-10-CM

## 2020-01-15 NOTE — Progress Notes (Signed)
EPIC Encounter for ICM Monitoring  Patient Name: [name] [name] is a 64 y.o. male Date: 01/15/2020 Primary Care Physican: Curly Rim, MD Primary Cardiologist:Crenshaw Electrophysiologist:Taylor 12/14/2019 OfficeWeight:174lbs  Transmission reviewed.  Corvue thoracic impedancenormal.  Prescribed: Furosemide 40 mg take 1 tablet daily prescribed by PCP and said he could take it as needed.  Recommendations:No changes.  Follow-up plan: ICM clinic phone appointment on11/19/2021. 91 day device clinic remote transmission11/24/2021.   EP/Cardiology Office Visits:Recall for 03/05/2020 with Dr Lovena Le.  2/1/2022with Dr. [name].  Copy of ICM check sent to Dr.Taylor.     3 month ICM trend: 01/14/2020    1 Year ICM trend:       Rosalene Billings, RN 01/15/2020 11:18 AM

## 2020-01-18 ENCOUNTER — Other Ambulatory Visit: Payer: Self-pay | Admitting: Cardiology

## 2020-02-15 ENCOUNTER — Ambulatory Visit (INDEPENDENT_AMBULATORY_CARE_PROVIDER_SITE_OTHER): Payer: Medicare Other

## 2020-02-15 ENCOUNTER — Telehealth: Payer: Self-pay

## 2020-02-15 DIAGNOSIS — Z95810 Presence of automatic (implantable) cardiac defibrillator: Secondary | ICD-10-CM | POA: Diagnosis not present

## 2020-02-15 DIAGNOSIS — I5022 Chronic systolic (congestive) heart failure: Secondary | ICD-10-CM | POA: Diagnosis not present

## 2020-02-15 NOTE — Telephone Encounter (Signed)
Remote ICM transmission received.  Attempted call to patient regarding ICM remote transmission and left detailed message per DPR.  Advised to return call for any fluid symptoms or questions. Next ICM remote transmission scheduled 02/20/2020.

## 2020-02-15 NOTE — Progress Notes (Signed)
EPIC Encounter for ICM Monitoring  Patient Name: [name] [name] is a 64 y.o. male Date: 02/15/2020 Primary Care Physican: Curly Rim, MD Primary Cardiologist:Crenshaw Electrophysiologist:Taylor 12/14/2019 OfficeWeight:174lbs  Attempted call to patient and unable to reach.  Left detailed message per DPR regarding transmission. Transmission reviewed.   Corvue thoracic impedancesuggesting possible fluid accumulation starting 02/11/2020 but trending back toward baseline.  Prescribed: Furosemide 40 mg take 1 tablet daily prescribed by PCP and said he could take it as needed.  Recommendations:Left voice mail with ICM number and encouraged to call if experiencing any fluid symptoms.  Follow-up plan: ICM clinic phone appointment on11/24/2021 to recheck fluid levels. 91 day device clinic remote transmission11/24/2021.   EP/Cardiology Office Visits:Recall for 03/05/2020 with Dr Lovena Le.  2/1/2022with Dr. [name].  Copy of ICM check sent to Dr.Taylor and Dr [name].    3 month ICM trend: 02/15/2020    1 Year ICM trend:       Rosalene Billings, RN 02/15/2020 8:58 AM

## 2020-02-20 ENCOUNTER — Ambulatory Visit (INDEPENDENT_AMBULATORY_CARE_PROVIDER_SITE_OTHER): Payer: Medicare Other

## 2020-02-20 DIAGNOSIS — Z95810 Presence of automatic (implantable) cardiac defibrillator: Secondary | ICD-10-CM

## 2020-02-20 DIAGNOSIS — I5022 Chronic systolic (congestive) heart failure: Secondary | ICD-10-CM

## 2020-02-20 NOTE — Progress Notes (Signed)
EPIC Encounter for ICM Monitoring  Patient Name: [name] [name] is a 64 y.o. male Date: 02/20/2020 Primary Care Physican: Curly Rim, MD Primary Cardiologist:Crenshaw Electrophysiologist:Taylor 12/14/2019 OfficeWeight:174lbs  Transmission reviewed.   Corvue thoracic impedancesuggesting fluid levels returned to normal.  Prescribed: Furosemide 40 mg take 1 tablet dailyprescribed byPCP andsaid he could take it as needed.  Recommendations:No changes  Follow-up plan: ICM clinic phone appointment on1/05/2020. 91 day device clinic remote transmission2/23/2022.   EP/Cardiology Office Visits:Recall for 03/05/2020 with Dr Lovena Le.2/1/2022with Dr. [name].  Copy of ICM check sent to Dr.Taylor .   3 month ICM trend: 02/19/2020      Rosalene Billings, RN 02/20/2020 1:52 PM

## 2020-03-31 ENCOUNTER — Ambulatory Visit (INDEPENDENT_AMBULATORY_CARE_PROVIDER_SITE_OTHER): Payer: Medicare Other

## 2020-03-31 DIAGNOSIS — I5022 Chronic systolic (congestive) heart failure: Secondary | ICD-10-CM

## 2020-03-31 DIAGNOSIS — Z95810 Presence of automatic (implantable) cardiac defibrillator: Secondary | ICD-10-CM | POA: Diagnosis not present

## 2020-04-02 ENCOUNTER — Telehealth: Payer: Self-pay

## 2020-04-02 NOTE — Progress Notes (Signed)
EPIC Encounter for ICM Monitoring  Patient Name: [name] [name] is a 65 y.o. male Date: 04/02/2020 Primary Care Physican: Vivien Presto, MD Primary Cardiologist:Crenshaw Electrophysiologist:Taylor 12/14/2019 OfficeWeight:174lbs  Attempted call to patient and unable to reach.  Left detailed message per DPR regarding transmission. Transmission reviewed.   Corvue thoracic impedancesuggesting fluid levels returned to normal.  Prescribed: Furosemide 40 mg take 1 tablet dailyprescribed byPCP andsaid he could take it as needed.  Recommendations:Left voice mail with ICM number and encouraged to call if experiencing any fluid symptoms.  Follow-up plan: ICM clinic phone appointment on1/05/2020.91 day device clinic remote transmission2/23/2022.   EP/Cardiology Office Visits:Recall for 03/05/2020 with Dr Ladona Ridgel.2/1/2022with Dr. Jens Som.  Copy of ICM check sent to Dr.Taylor.   3 month ICM trend: 03/31/2020.    1 Year ICM trend:       Karie Soda, RN 04/02/2020 2:18 PM

## 2020-04-02 NOTE — Telephone Encounter (Signed)
Remote ICM transmission received.  Attempted call to patient regarding ICM remote transmission and left detailed message per DPR.  Advised to return call for any fluid symptoms or questions. Next ICM remote transmission scheduled 05/06/2020.     

## 2020-04-07 NOTE — Progress Notes (Signed)
Next ICM remote transmission scheduled for 05/06/2020.

## 2020-05-06 ENCOUNTER — Ambulatory Visit (INDEPENDENT_AMBULATORY_CARE_PROVIDER_SITE_OTHER): Payer: Medicare Other

## 2020-05-06 DIAGNOSIS — I5022 Chronic systolic (congestive) heart failure: Secondary | ICD-10-CM

## 2020-05-06 DIAGNOSIS — Z95810 Presence of automatic (implantable) cardiac defibrillator: Secondary | ICD-10-CM

## 2020-05-07 ENCOUNTER — Telehealth: Payer: Self-pay

## 2020-05-07 NOTE — Progress Notes (Signed)
EPIC Encounter for ICM Monitoring  Patient Name: [name] [name] is a 65 y.o. male Date: 05/07/2020 Primary Care Physican: Curly Rim, MD Primary Cardiologist:Crenshaw Electrophysiologist:Taylor 12/14/2019 OfficeWeight:174lbs  Attempted call to patient and unable to reach.  Left detailed message per DPR regarding transmission. Transmission reviewed.   Corvue thoracic impedancesuggestingpossible fluid accumulation starting 04/30/2020.  Prescribed: Furosemide 40 mg take 1 tablet dailyprescribed byPCP andsaid he could take it as needed.  Recommendations:Left voice mail with ICM number and encouraged to call if experiencing any fluid symptoms.  Follow-up plan: ICM clinic phone appointment on2/17/2022 to recheck fluid levels.91 day device clinic remote transmission2/23/2022.   EP/Cardiology Office Visits:Recall for 03/05/2020 with Dr Lovena Le.2/1/2022with Dr. [name].  Copy of ICM check sent to Dr.Taylor and Dr [name].   3 month ICM trend: 05/06/2020.    1 Year ICM trend:       Rosalene Billings, RN 05/07/2020 9:20 AM

## 2020-05-07 NOTE — Telephone Encounter (Signed)
Remote ICM transmission received.  Attempted call to patient regarding ICM remote transmission and left detailed message per DPR.  Advised to return call for any fluid symptoms or questions.  

## 2020-05-15 ENCOUNTER — Ambulatory Visit (INDEPENDENT_AMBULATORY_CARE_PROVIDER_SITE_OTHER): Payer: Medicare Other

## 2020-05-15 DIAGNOSIS — Z95810 Presence of automatic (implantable) cardiac defibrillator: Secondary | ICD-10-CM

## 2020-05-15 DIAGNOSIS — I5022 Chronic systolic (congestive) heart failure: Secondary | ICD-10-CM

## 2020-05-21 NOTE — Progress Notes (Signed)
EPIC Encounter for ICM Monitoring  Patient Name: [name] [name] is a 65 y.o. male Date: 05/21/2020 Primary Care Physican: Curly Rim, MD Primary Cardiologist:Crenshaw Electrophysiologist:Taylor 12/14/2019 OfficeWeight:174lbs  Transmission reviewed.  Corvue thoracic impedancesuggesting fluid levels returned to normal.  Prescribed: Furosemide 40 mg take 1 tablet dailyprescribed byPCP andsaid he could take it as needed.  Recommendations:No changes.  Follow-up plan: ICM clinic phone appointment on3/28/2022.91 day device clinic remote transmission5/25/2022.   EP/Cardiology Office Visits:Recall for 03/05/2020 with Dr Lovena Le.2/1/2022with Dr. [name].  Copy of ICM check sent to Dr.Taylor.  3 month ICM trend: 05/15/2020.    1 Year ICM trend:       Rosalene Billings, RN 05/21/2020 4:35 PM

## 2020-06-22 ENCOUNTER — Other Ambulatory Visit: Payer: Self-pay | Admitting: Cardiology

## 2020-06-22 DIAGNOSIS — I2510 Atherosclerotic heart disease of native coronary artery without angina pectoris: Secondary | ICD-10-CM

## 2020-06-23 ENCOUNTER — Telehealth: Payer: Self-pay

## 2020-06-23 ENCOUNTER — Ambulatory Visit (INDEPENDENT_AMBULATORY_CARE_PROVIDER_SITE_OTHER): Payer: Medicare Other

## 2020-06-23 DIAGNOSIS — I5022 Chronic systolic (congestive) heart failure: Secondary | ICD-10-CM

## 2020-06-23 DIAGNOSIS — Z95810 Presence of automatic (implantable) cardiac defibrillator: Secondary | ICD-10-CM

## 2020-06-23 NOTE — Progress Notes (Signed)
EPIC Encounter for ICM Monitoring  Patient Name: CHEVY SWEIGERT is a 65 y.o. male Date: 06/23/2020 Primary Care Physican: Curly Rim, MD Primary Cardiologist:Crenshaw Electrophysiologist:Taylor Last OfficeWeight:174lbs  Attempted call to patient and unable to reach.  Left detailed message per DPR regarding transmission. Transmission reviewed.   Corvue thoracic impedancesuggesting possible fluid accumulation starting 06/20/2020.  Prescribed: Furosemide 40 mg take 1 tablet dailyprescribed byPCP andsaid he could take it as needed.  Recommendations:Left voice mail with ICM number and encouraged to call if experiencing any fluid symptoms.  Follow-up plan: ICM clinic phone appointment on4/07/2020 to recheck fluid levels.91 day device clinic remote transmission5/25/2022.   EP/Cardiology Office Visits:Recall for 03/05/2020 with Dr Lovena Le.2/1/2022with Dr. [name].  Copy of ICM check sent to Dr.Taylor.  3 month ICM trend: 06/23/2020.    1 Year ICM trend:       Rosalene Billings, RN 06/23/2020 3:31 PM

## 2020-06-23 NOTE — Telephone Encounter (Signed)
Remote ICM transmission received.  Attempted call to patient regarding ICM remote transmission and left detailed message per DPR.  Advised to return call for any fluid symptoms or questions. Next ICM remote transmission scheduled 07/01/2020.

## 2020-07-01 ENCOUNTER — Ambulatory Visit (INDEPENDENT_AMBULATORY_CARE_PROVIDER_SITE_OTHER): Payer: Medicare Other

## 2020-07-01 DIAGNOSIS — Z95810 Presence of automatic (implantable) cardiac defibrillator: Secondary | ICD-10-CM

## 2020-07-01 DIAGNOSIS — I5022 Chronic systolic (congestive) heart failure: Secondary | ICD-10-CM

## 2020-07-02 NOTE — Progress Notes (Signed)
EPIC Encounter for ICM Monitoring  Patient Name: [name] [name] is a 65 y.o. male Date: 07/02/2020 Primary Care Physican: Curly Rim, MD Primary Cardiologist:Crenshaw Electrophysiologist:Taylor Last OfficeWeight:174lbs  Transmission reviewed.   Corvue thoracic impedancesuggestingfluid levels returned to normal.  Prescribed: Furosemide 40 mg take 1 tablet dailyprescribed byPCP andsaid he could take it as needed.  Recommendations:No changes  Follow-up plan: ICM clinic phone appointment on5/04/2020.91 day device clinic remote transmission5/25/2022.   EP/Cardiology Office Visits:Recall for 03/05/2020 with Dr Lovena Le.2/1/2022with Dr. [name].  Copy of ICM check sent to Dr.Taylor.  3 month ICM trend: 07/01/2020.    1 Year ICM trend:       Rosalene Billings, RN 07/02/2020 10:58 AM

## 2020-07-28 ENCOUNTER — Telehealth: Payer: Self-pay

## 2020-07-28 ENCOUNTER — Ambulatory Visit (INDEPENDENT_AMBULATORY_CARE_PROVIDER_SITE_OTHER): Payer: Medicare Other

## 2020-07-28 DIAGNOSIS — I5022 Chronic systolic (congestive) heart failure: Secondary | ICD-10-CM | POA: Diagnosis not present

## 2020-07-28 DIAGNOSIS — Z95810 Presence of automatic (implantable) cardiac defibrillator: Secondary | ICD-10-CM | POA: Diagnosis not present

## 2020-07-28 NOTE — Telephone Encounter (Signed)
Remote ICM transmission received.  Attempted call to patient regarding ICM remote transmission and left detailed message per DPR.  Advised to return call for any fluid symptoms or questions. Next ICM remote transmission scheduled 09/08/2020.     

## 2020-07-28 NOTE — Progress Notes (Signed)
EPIC Encounter for ICM Monitoring  Patient Name: [name] [name] is a 65 y.o. male Date: 07/28/2020 Primary Care Physican: Curly Rim, MD Primary Cardiologist:Crenshaw Electrophysiologist:Taylor LastOfficeWeight:174lbs  Attempted call to patient and unable to reach.  Left detailed message per DPR regarding transmission. Transmission reviewed.   Corvue thoracic impedancesuggestingnormal fluid levels.  Prescribed: Furosemide 40 mg take 1 tablet dailyprescribed byPCP andsaid he could take it as needed.  Recommendations:Left voice mail with ICM number and encouraged to call if experiencing any fluid symptoms.  Follow-up plan: ICM clinic phone appointment on6/13/2022.91 day device clinic remote transmission5/25/2022.   EP/Cardiology Office Visits:Recall for 03/05/2020 with Dr Lovena Le.Recall 04/29/2020 with Dr [name]  Copy of ICM check sent to Dr.Taylor.  3 month ICM trend: 07/28/2020.    1 Year ICM trend:       Rosalene Billings, RN 07/28/2020 4:42 PM

## 2020-07-29 ENCOUNTER — Other Ambulatory Visit: Payer: Self-pay | Admitting: Cardiology

## 2020-07-29 DIAGNOSIS — I2510 Atherosclerotic heart disease of native coronary artery without angina pectoris: Secondary | ICD-10-CM

## 2020-08-20 ENCOUNTER — Ambulatory Visit (INDEPENDENT_AMBULATORY_CARE_PROVIDER_SITE_OTHER): Payer: Medicare Other

## 2020-08-20 DIAGNOSIS — I255 Ischemic cardiomyopathy: Secondary | ICD-10-CM

## 2020-08-21 LAB — CUP PACEART REMOTE DEVICE CHECK
Battery Remaining Longevity: 8 mo
Battery Remaining Percentage: 8 %
Battery Voltage: 2.65 V
Brady Statistic RV Percent Paced: 1 %
Date Time Interrogation Session: 20220525112240
HighPow Impedance: 69 Ohm
HighPow Impedance: 69 Ohm
Implantable Lead Implant Date: 20130620
Implantable Lead Location: 753860
Implantable Lead Model: 181
Implantable Lead Serial Number: 316507
Implantable Pulse Generator Implant Date: 20130620
Lead Channel Impedance Value: 440 Ohm
Lead Channel Pacing Threshold Amplitude: 0.75 V
Lead Channel Pacing Threshold Pulse Width: 0.5 ms
Lead Channel Sensing Intrinsic Amplitude: 11.7 mV
Lead Channel Setting Pacing Amplitude: 2.5 V
Lead Channel Setting Pacing Pulse Width: 0.5 ms
Lead Channel Setting Sensing Sensitivity: 0.5 mV
Pulse Gen Serial Number: 1033528

## 2020-09-08 ENCOUNTER — Ambulatory Visit (INDEPENDENT_AMBULATORY_CARE_PROVIDER_SITE_OTHER): Payer: Medicare Other

## 2020-09-08 DIAGNOSIS — Z95810 Presence of automatic (implantable) cardiac defibrillator: Secondary | ICD-10-CM | POA: Diagnosis not present

## 2020-09-08 DIAGNOSIS — I5022 Chronic systolic (congestive) heart failure: Secondary | ICD-10-CM | POA: Diagnosis not present

## 2020-09-10 ENCOUNTER — Telehealth: Payer: Self-pay

## 2020-09-10 NOTE — Progress Notes (Signed)
EPIC Encounter for ICM Monitoring  Patient Name: [name] [name] is a 65 y.o. male Date: 09/10/2020 Primary Care Physican: Curly Rim, MD Primary Cardiologist: [name] Electrophysiologist: Lovena Le Last Office Weight: 174 lbs       Attempted call to patient and unable to reach.  Left detailed message per DPR regarding transmission. Transmission reviewed.    Corvue thoracic impedance suggesting normal fluid levels.   Prescribed: Furosemide 40 mg take 1 tablet daily prescribed by PCP and said he could take it as needed.     Recommendations: Left voice mail with ICM number and encouraged to call if experiencing any fluid symptoms.   Follow-up plan: ICM clinic phone appointment on 10/13/2020.  91 day device clinic remote transmission 11/19/2020.      EP/Cardiology Office Visits: Recall for 03/05/2020 with Dr Lovena Le.  Recall 04/29/2020 with Dr [name]   Copy of ICM check sent to Dr. Lovena Le.  3 month ICM trend: 09/08/2020.    1 Year ICM trend:       Rosalene Billings, RN 09/10/2020 11:08 AM

## 2020-09-10 NOTE — Telephone Encounter (Signed)
Remote ICM transmission received.  Attempted call to patient regarding ICM remote transmission and left detailed message per DPR.  Advised to return call for any fluid symptoms or questions. Next ICM remote transmission scheduled 10/13/2020.

## 2020-09-11 NOTE — Progress Notes (Signed)
Remote ICD transmission.   

## 2020-09-20 ENCOUNTER — Other Ambulatory Visit: Payer: Self-pay | Admitting: Cardiology

## 2020-09-20 DIAGNOSIS — I2510 Atherosclerotic heart disease of native coronary artery without angina pectoris: Secondary | ICD-10-CM

## 2020-10-08 NOTE — Progress Notes (Signed)
HPI: FU coronary artery disease. He had an acute anterior infarct in 2003. At that time, he had PCI of his LAD and diagonal. Note, the Lcx and RCA had no disease. Carotid Dopplers in June of 2009 showed normal carotids. Echocardiogram in May of 2013 showed an ejection fraction of 30-35%. There was mild mitral regurgitation and trace aortic insufficiency. There was mild left atrial enlargement. Patient had ICD placed in June of 2013. Nuclear study December 2018 showed ejection fraction 25%.  There was prior infarct but no ischemia.  Since last seen, patient denies dyspnea, chest pain, palpitations or syncope.  He has noticed bilateral ankle edema.  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  He has weakness in his lower extremities bilaterally and now mild weakness in his upper extremities.  Current Outpatient Medications  Medication Sig Dispense Refill   aspirin EC 81 MG tablet Take 1 tablet (81 mg total) by mouth daily. 90 tablet 3   atorvastatin (LIPITOR) 80 MG tablet TAKE 1 TABLET BY MOUTH  DAILY 90 tablet 3   carvedilol (COREG) 12.5 MG tablet Take 1 tablet (12.5 mg total) by mouth 2 (two) times daily with a meal. 60 tablet 0   ramipril (ALTACE) 5 MG capsule TAKE 1 CAPSULE BY MOUTH  TWICE DAILY 180 capsule 3   riluzole (RILUTEK) 50 MG tablet Take 50 mg by mouth 2 (two) times daily.     CIALIS 20 MG tablet  (Patient not taking: Reported on 10/13/2020)     No current facility-administered medications for this visit.     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   Anginal pain (HCC)    NO RECENT ANGINA   Bladder cancer ([place])    CAD    CARDIOMYOPATHY, ISCHEMIC    CHF    Chronic kidney disease    HYPERLIPIDEMIA    HYPERTENSION    ICD (implantable cardiac defibrillator) in place 09/16/2011   Left ventricular dysfunction    Myocardial infarction [facility]) 2003    Past Surgical History:  Procedure  Laterality Date   CARDIAC CATHETERIZATION     CYSTOSCOPY W/ URETERAL STENT PLACEMENT Left 10/03/2013   Procedure: CYSTOSCOPY WITH RETROGRADE PYELOGRAM/URETERAL STENT PLACEMENT;  Surgeon: Sharyn Creamer, MD;  Location: [facility];  Service: Urology;  Laterality: Left;   CYSTOSCOPY W/ URETERAL STENT PLACEMENT Left 03/15/2014   Procedure: CYSTOSCOPY WITH RETROGRADE PYELOGRAM/URETERAL STENT PLACEMENT;  Surgeon: Alexis Frock, MD;  Location: [facility];  Service: Urology;  Laterality: Left;   CYSTOSCOPY WITH RETROGRADE PYELOGRAM, URETEROSCOPY AND STENT PLACEMENT Bilateral 11/14/2013   Procedure: CYSTOSCOPY WITH BILATERAL RETROGRADE PYELOGRAM, LEFT URETEROSCOPY WITH BIOPSY  AND STENT EXCHANGE;  Surgeon: Alexis Frock, MD;  Location: [facility];  Service: Urology;  Laterality: Bilateral;   HERNIA REPAIR  2010   right inguinal   ICD  09/16/2011   IMPLANTABLE CARDIOVERTER DEFIBRILLATOR IMPLANT N/A 09/16/2011   Procedure: IMPLANTABLE CARDIOVERTER DEFIBRILLATOR IMPLANT;  Surgeon: Evans Lance, MD;  Location: [facility] CATH LAB;  Service: Cardiovascular;  Laterality: N/A;   kidney removed  03/17/2014   pt had ear surgery     ROBOT ASSITED LAPAROSCOPIC NEPHROURETERECTOMY Left 03/15/2014   Procedure: ROBOT ASSITED LAPAROSCOPIC NEPHROURETERECTOMY;  Surgeon: Alexis Frock, MD;  Location: [facility];  Service: Urology;  Laterality: Left;   TRANSURETHRAL RESECTION OF BLADDER TUMOR WITH GYRUS (TURBT-GYRUS) N/A 10/03/2013   Procedure: TRANSURETHRAL RESECTION OF BLADDER TUMOR WITH GYRUS (TURBT-GYRUS);  Surgeon: Quillian Quince  Stann Ore, MD;  Location: [facility];  Service: Urology;  Laterality: N/A;   TRANSURETHRAL RESECTION OF BLADDER TUMOR WITH GYRUS (TURBT-GYRUS) N/A 11/14/2013   Procedure: TRANSURETHRAL RESECTION OF BLADDER TUMOR WITH GYRUS (TURBT-GYRUS);  Surgeon: Alexis Frock, MD;  Location: [facility];  Service: Urology;  Laterality: N/A;    Social History   Socioeconomic History   Marital status: Married    Spouse name: Not on file    Number of children: Not on file   Years of education: Not on file   Highest education level: Not on file  Occupational History   Not on file  Tobacco Use   Smoking status: Former    Packs/day: 0.50    Years: 6.00    Pack years: 3.00    Types: Cigarettes    Quit date: 09/16/1971    Years since quitting: 49.1   Smokeless tobacco: Never  Vaping Use   Vaping Use: Never used  Substance and Sexual Activity   Alcohol use: No   Drug use: No   Sexual activity: Yes  Other Topics Concern   Not on file  Social History Narrative   Not on file   Social Determinants of Health   Financial Resource Strain: Not on file  Food Insecurity: Not on file  Transportation Needs: Not on file  Physical Activity: Not on file  Stress: Not on file  Social Connections: Not on file  Intimate Partner Violence: Not on file    Family History  Problem Relation Age of Onset   Cancer Father        prostate   Heart Problems Maternal Uncle    Cancer Paternal Uncle        lung   Cancer Paternal Grandmother        lung   Cancer Brother        lung   Heart failure Unknown     ROS: no fevers or chills, productive cough, hemoptysis, dysphasia, odynophagia, melena, hematochezia, dysuria, hematuria, rash, seizure activity, orthopnea, PND, pedal edema, claudication. Remaining systems are negative.  Physical Exam: Well-developed well-nourished in no acute distress.  Skin is warm and dry.  HEENT is normal.  Neck is supple.  Chest is clear to auscultation with normal expansion.  Cardiovascular exam is regular rate and rhythm.  Abdominal exam nontender or distended. No masses palpated. Extremities show 1+edema. neuro increased weakness lower extremities and upper extremities.  ECG-sinus rhythm at a rate of 60, anterior lateral T wave inversion.  Personally reviewed  A/P  1 coronary artery disease-patient denies chest pain.  Continue medical therapy with aspirin and statin.  2 prior ICD-Per  electrophysiology.  3 hypertension-blood pressure controlled.    4 hyperlipidemia-continue statin.  5 ischemic cardiomyopathy-patient has developed some CHF symptoms.  We will repeat echocardiogram.  Continue carvedilol.  Discontinue ramipril.  In 48 hours we will begin Entresto 24/26 twice daily.  Check potassium and renal function 1 week later.  Add Lasix 20 mg daily as needed.  6 ALS-per neurology.  Kirk Ruths, MD

## 2020-10-13 ENCOUNTER — Other Ambulatory Visit: Payer: Self-pay

## 2020-10-13 ENCOUNTER — Ambulatory Visit (INDEPENDENT_AMBULATORY_CARE_PROVIDER_SITE_OTHER): Payer: Medicare Other | Admitting: Cardiology

## 2020-10-13 ENCOUNTER — Encounter: Payer: Self-pay | Admitting: Cardiology

## 2020-10-13 ENCOUNTER — Ambulatory Visit (INDEPENDENT_AMBULATORY_CARE_PROVIDER_SITE_OTHER): Payer: Medicare Other

## 2020-10-13 VITALS — BP 103/73 | HR 62 | Ht 68.0 in | Wt 174.0 lb

## 2020-10-13 DIAGNOSIS — I255 Ischemic cardiomyopathy: Secondary | ICD-10-CM | POA: Diagnosis not present

## 2020-10-13 DIAGNOSIS — I5022 Chronic systolic (congestive) heart failure: Secondary | ICD-10-CM

## 2020-10-13 DIAGNOSIS — E7800 Pure hypercholesterolemia, unspecified: Secondary | ICD-10-CM | POA: Diagnosis not present

## 2020-10-13 DIAGNOSIS — Z95810 Presence of automatic (implantable) cardiac defibrillator: Secondary | ICD-10-CM

## 2020-10-13 DIAGNOSIS — I10 Essential (primary) hypertension: Secondary | ICD-10-CM | POA: Diagnosis not present

## 2020-10-13 DIAGNOSIS — I2510 Atherosclerotic heart disease of native coronary artery without angina pectoris: Secondary | ICD-10-CM

## 2020-10-13 MED ORDER — SACUBITRIL-VALSARTAN 24-26 MG PO TABS: 1.0000 | ORAL_TABLET | Freq: Two times a day (BID) | ORAL | 11 refills | Status: DC

## 2020-10-13 MED ORDER — FUROSEMIDE 20 MG PO TABS: 20.0000 mg | ORAL_TABLET | Freq: Every day | ORAL | 3 refills | Status: DC | PRN

## 2020-10-13 NOTE — Patient Instructions (Signed)
Medication Instructions:   STOP THE RAMIPRIL  ON Wednesday START ENTRESTO 24/26 MG ONE TABLET TWICE DAILY  TAKE FUROSEMIDE 20 MG ONCE DAILY AS NEEDED FOR FLUID  *If you need a refill on your cardiac medications before your next appointment, please call your pharmacy*   Lab Work:  Your physician recommends that you return for lab work in: [place]  If you have labs (blood work) drawn today and your tests are completely normal, you will receive your results only by: [employer] (if you have MyChart) OR A paper copy in the mail If you have any lab test that is abnormal or we need to change your treatment, we will call you to review the results.   Testing/Procedures:  Your physician has requested that you have an echocardiogram. Echocardiography is a painless test that uses sound waves to create images of your heart. It provides your doctor with information about the size and shape of your heart and how well your heart's chambers and valves are working. This procedure takes approximately one hour. There are no restrictions for this procedure. [place]   Follow-Up: At [facility], you and your health needs are our priority.  As part of our continuing mission to provide you with exceptional heart care, we have created designated Provider Care Teams.  These Care Teams include your primary Cardiologist (physician) and Advanced Practice Providers (APPs -  Physician Assistants and Nurse Practitioners) who all work together to provide you with the care you need, when you need it.  We recommend signing up for the patient portal called "MyChart".  Sign up information is provided on this After Visit Summary.  MyChart is used to connect with patients for Virtual Visits (Telemedicine).  Patients are able to view lab/test results, encounter notes, upcoming appointments, etc.  Non-urgent messages can be sent to your provider as well.   To learn more about what you can do  with MyChart, go to [email or web address].    Your next appointment:   4-5 month(s)  The format for your next appointment:   In Person  Provider:   Kirk Ruths, MD

## 2020-10-15 ENCOUNTER — Telehealth: Payer: Self-pay | Admitting: Cardiology

## 2020-10-15 NOTE — Telephone Encounter (Signed)
Pt c/o medication issue:  1. Name of Medication: Entresto  2. How are you currently taking this medication (dosage and times per day)?   3. Are you having a reaction (difficulty breathing--STAT)?   4. What is your medication issue? Pt is calling with questions in regards to the medication that he was just put on

## 2020-10-15 NOTE — Telephone Encounter (Signed)
Returned the call to the patient. He stated that he was recently started on Entresto but that it will cost him over $100 a month.  Patient assistance forms have been mailed to him. He will fill them out and mail them back. He does have a month's worth already.

## 2020-10-15 NOTE — Progress Notes (Signed)
EPIC Encounter for ICM Monitoring  Patient Name: [name] [name] is a 65 y.o. male Date: 10/15/2020 Primary Care Physican: Curly Rim, MD Primary Cardiologist: [name] Electrophysiologist: Lovena Le 10/13/2020 Office Weight: 174 lbs       Transmission reviewed.   Corvue thoracic impedance suggesting normal fluid levels.   Prescribed: Furosemide 20 mg take 1 tablet as needed.     Recommendations: No changes   Follow-up plan: ICM clinic phone appointment on 11/17/2020.  91 day device clinic remote transmission 11/19/2020.      EP/Cardiology Office Visits: Recall for 03/05/2020 with Dr Lovena Le.  02/02/2021 with Dr [name]   Copy of ICM check sent to Dr. Lovena Le.   3 month ICM trend: 10/13/2020.    1 Year ICM trend:       Rosalene Billings, RN 10/15/2020 10:17 AM

## 2020-10-19 ENCOUNTER — Other Ambulatory Visit: Payer: Self-pay | Admitting: Cardiology

## 2020-10-19 DIAGNOSIS — I2510 Atherosclerotic heart disease of native coronary artery without angina pectoris: Secondary | ICD-10-CM

## 2020-10-23 ENCOUNTER — Other Ambulatory Visit: Payer: Self-pay

## 2020-10-23 ENCOUNTER — Ambulatory Visit (HOSPITAL_COMMUNITY): Payer: Medicare Other | Attending: Cardiology

## 2020-10-23 DIAGNOSIS — I255 Ischemic cardiomyopathy: Secondary | ICD-10-CM | POA: Diagnosis present

## 2020-10-23 LAB — ECHOCARDIOGRAM COMPLETE
Area-P 1/2: 3.27 cm2
S' Lateral: 2.9 cm

## 2020-10-23 MED ORDER — PERFLUTREN LIPID MICROSPHERE
1.0000 mL | INTRAVENOUS | Status: AC | PRN
  Administered 2020-10-23: 3 mL via INTRAVENOUS

## 2020-10-28 ENCOUNTER — Telehealth: Payer: Self-pay

## 2020-10-28 ENCOUNTER — Other Ambulatory Visit: Payer: Self-pay

## 2020-10-28 DIAGNOSIS — Z79899 Other long term (current) drug therapy: Secondary | ICD-10-CM

## 2020-10-28 MED ORDER — SPIRONOLACTONE 25 MG PO TABS
12.5000 mg | ORAL_TABLET | Freq: Every day | ORAL | 3 refills | Status: DC
Start: 2020-10-28 — End: 2021-01-01

## 2020-10-28 NOTE — Telephone Encounter (Addendum)
Left voice message for patient to give office a call back for results and addition of medications.   ----- Message from Lelon Perla, MD sent at 10/25/2020  6:33 PM EDT ----- LV function remains severely reduced; add spironolactone 12.5 mg daily; bmet one week Kirk Ruths

## 2020-11-07 LAB — BASIC METABOLIC PANEL
BUN: 16 mg/dL (ref 7–25)
CO2: 27 mmol/L (ref 20–32)
Calcium: 9.3 mg/dL (ref 8.6–10.3)
Chloride: 101 mmol/L (ref 98–110)
Creat: 0.73 mg/dL (ref 0.70–1.35)
Glucose, Bld: 112 mg/dL (ref 65–139)
Potassium: 4.1 mmol/L (ref 3.5–5.3)
Sodium: 136 mmol/L (ref 135–146)

## 2020-11-10 ENCOUNTER — Encounter: Payer: Self-pay | Admitting: *Deleted

## 2020-11-13 ENCOUNTER — Other Ambulatory Visit: Payer: Self-pay

## 2020-11-13 DIAGNOSIS — I2510 Atherosclerotic heart disease of native coronary artery without angina pectoris: Secondary | ICD-10-CM

## 2020-11-13 MED ORDER — CARVEDILOL 12.5 MG PO TABS: 12.5000 mg | ORAL_TABLET | Freq: Two times a day (BID) | ORAL | 1 refills | Status: DC

## 2020-11-14 ENCOUNTER — Telehealth: Payer: Self-pay | Admitting: Cardiology

## 2020-11-14 NOTE — Telephone Encounter (Signed)
Pt c/o medication issue:  1. Name of Medication: sacubitril-valsartan (ENTRESTO) 24-26 MG  2. How are you currently taking this medication (dosage and times per day)? As directed .  3. Are you having a reaction (difficulty breathing--STAT)? no  4. What is your medication issue? Patient needs Dr. Jacalyn Lefevre office to fill out part of his patient assistance application

## 2020-11-14 NOTE — Telephone Encounter (Signed)
Called patient, pt reports he sent in his patient assistance papers but did not realize he needed to have the physician fill out a portion of the application. As a result the application was not accepted. Nurse gave pt the office fax number so he can fax the forms to our office to be completed.   Pt also reports he is almost out of medication. He reports he has enough for four more days, and has refills but cannot afford to fill the medication. Advised pt he could pick up some samples while he awaits approval of assistance paperwork. Confirmed with patient that he takes Entresto 24-'26mg'$  1 tablet twice daily. 1 sample bottle Entresto 24-'26mg'$  provided for patient to pick up. Pt verbalized understanding of instruction, all questions/concerns addressed at this time.

## 2020-11-17 ENCOUNTER — Ambulatory Visit (INDEPENDENT_AMBULATORY_CARE_PROVIDER_SITE_OTHER): Payer: Medicare Other

## 2020-11-17 ENCOUNTER — Telehealth: Payer: Self-pay

## 2020-11-17 DIAGNOSIS — Z95810 Presence of automatic (implantable) cardiac defibrillator: Secondary | ICD-10-CM

## 2020-11-17 DIAGNOSIS — I5022 Chronic systolic (congestive) heart failure: Secondary | ICD-10-CM

## 2020-11-17 NOTE — Progress Notes (Signed)
EPIC Encounter for ICM Monitoring  Patient Name: [name] [name] is a 65 y.o. male Date: 11/17/2020 Primary Care Physican: Curly Rim, MD Primary Cardiologist: [name] Electrophysiologist: Lovena Le 10/13/2020 Office Weight: 174 lbs       Attempted call to patient and unable to reach.  Left detailed message per DPR regarding transmission. Transmission reviewed.    Corvue thoracic impedance suggesting possible fluid accumulation starting 11/09/2020.   Prescribed: Furosemide 20 mg take 1 tablet as needed.     Recommendations: Left voice mail with call back number.  Will advise to take PRN Furosemide if patient returns call.    Follow-up plan: ICM clinic phone appointment on 11/19/2020 to recheck fluid levels.  91 day device clinic remote transmission 11/19/2020.      EP/Cardiology Office Visits: Recall for 03/05/2020 with Dr Lovena Le.  02/02/2021 with Dr [name]   Copy of ICM check sent to Dr. Lovena Le.    3 month ICM trend: 11/17/2020.    1 Year ICM trend:       Rosalene Billings, RN 11/17/2020 4:14 PM

## 2020-11-17 NOTE — Telephone Encounter (Signed)
Remote ICM transmission received.  Attempted call to patient regarding ICM remote transmission and left detailed message per DPR.  Advised to return call for any fluid symptoms or questions. Next ICM remote transmission scheduled 11/25/2020.    

## 2020-11-18 ENCOUNTER — Telehealth: Payer: Self-pay | Admitting: *Deleted

## 2020-11-18 NOTE — Telephone Encounter (Signed)
Patient returning call. He states he has already filled out his part of the form and sent it back to [employer].

## 2020-11-18 NOTE — Telephone Encounter (Signed)
Per pt filled out his portion and faxed to [employer] and [employer] called pt and stated needed MD portion filled out .Did [employer] not send appt with pt's portion? Will forward to Fredia Beets RN to review .Adonis Housekeeper

## 2020-11-18 NOTE — Telephone Encounter (Signed)
Pt is returning a call from earlier today. Please advise pt furher

## 2020-11-18 NOTE — Telephone Encounter (Signed)
Received blank [employer] patient assistance forms for entresto. ? Has patient done his part and if so can he bring it by to me and if he has not then need to mail this application to him to complete.

## 2020-11-18 NOTE — Telephone Encounter (Signed)
Spoke with pt, aware will fax provider portion of the application to [employer]. Will wait to see if they need him to redo his part.

## 2020-11-19 ENCOUNTER — Other Ambulatory Visit: Payer: Self-pay | Admitting: Cardiology

## 2020-11-19 ENCOUNTER — Telehealth: Payer: Self-pay

## 2020-11-19 DIAGNOSIS — I2510 Atherosclerotic heart disease of native coronary artery without angina pectoris: Secondary | ICD-10-CM

## 2020-11-19 NOTE — Telephone Encounter (Signed)
Filled 11/13/20

## 2020-11-19 NOTE — Telephone Encounter (Signed)
Increased patients ICD checks to monthly, estimated 5.6 months left on battery. Patient called and made aware. Appreciative of call.

## 2020-11-21 ENCOUNTER — Telehealth: Payer: Self-pay

## 2020-11-21 ENCOUNTER — Ambulatory Visit (INDEPENDENT_AMBULATORY_CARE_PROVIDER_SITE_OTHER): Payer: Medicare Other

## 2020-11-21 DIAGNOSIS — Z95810 Presence of automatic (implantable) cardiac defibrillator: Secondary | ICD-10-CM

## 2020-11-21 DIAGNOSIS — I5022 Chronic systolic (congestive) heart failure: Secondary | ICD-10-CM

## 2020-11-21 NOTE — Progress Notes (Signed)
EPIC Encounter for ICM Monitoring  Patient Name: [name] [name] is a 65 y.o. male Date: 11/21/2020 Primary Care Physican: Curly Rim, MD Primary Cardiologist: [name] Electrophysiologist: Lovena Le 10/13/2020 Office Weight: 174 lbs       Attempted call to patient and unable to reach.  Left detailed message per DPR regarding transmission. Transmission reviewed.    Corvue thoracic impedance suggesting fluid levels returned to normal.   Prescribed: Furosemide 20 mg take 1 tablet as needed.     Recommendations: Left voice mail with call back number.     Follow-up plan: ICM clinic phone appointment on 12/22/2020.  91 day device clinic remote transmission 02/23/2021.      EP/Cardiology Office Visits: Recall for 03/05/2020 with Dr Lovena Le.  02/02/2021 with Dr [name]   Copy of ICM check sent to Dr. Lovena Le.   3 month ICM trend: 11/19/2020.    1 Year ICM trend:       Rosalene Billings, RN 11/21/2020 11:26 AM

## 2020-11-21 NOTE — Telephone Encounter (Signed)
Remote ICM transmission received.  Attempted call to patient regarding ICM remote transmission and left detailed message per DPR.  Advised to return call for any fluid symptoms or questions. Next ICM remote transmission scheduled 12/29/2020.

## 2020-11-25 NOTE — Telephone Encounter (Signed)
Patient has been approved for patient assistance for entresto.

## 2020-12-23 ENCOUNTER — Ambulatory Visit (INDEPENDENT_AMBULATORY_CARE_PROVIDER_SITE_OTHER): Payer: Medicare Other

## 2020-12-23 ENCOUNTER — Telehealth: Payer: Self-pay

## 2020-12-23 DIAGNOSIS — Z95810 Presence of automatic (implantable) cardiac defibrillator: Secondary | ICD-10-CM

## 2020-12-23 DIAGNOSIS — I5022 Chronic systolic (congestive) heart failure: Secondary | ICD-10-CM

## 2020-12-23 NOTE — Progress Notes (Signed)
EPIC Encounter for ICM Monitoring  Patient Name: [name] [name] is a 65 y.o. male Date: 12/23/2020 Primary Care Physican: Curly Rim, MD Primary Cardiologist: [name] Electrophysiologist: Lovena Le 10/13/2020 Office Weight: 174 lbs      Battery Longevity: 3.7 months   Attempted call to patient and unable to reach.  Left detailed message per DPR regarding transmission. Transmission reviewed.    Corvue thoracic impedance suggesting possible fluid accumulation starting 12/19/2020.   Prescribed: Furosemide 20 mg take 1 tablet as needed.     Recommendations: Left voice mail with call back number.  Will advise to take PRN Lasix if patient returns call.    Follow-up plan: ICM clinic phone appointment on 12/29/2020 to recheck fluid levels.  91 day device clinic remote transmission 02/23/2021.      EP/Cardiology Office Visits: Recall for 03/05/2020 with Dr Lovena Le.  02/02/2021 with Dr [name]   Copy of ICM check sent to Dr. Lovena Le.  Will send copy to Dr [name] for review if patient is reached.   3 month ICM trend: 12/22/2020.    1 Year ICM trend:       Rosalene Billings, RN 12/23/2020 12:45 PM

## 2020-12-23 NOTE — Telephone Encounter (Signed)
Remote ICM transmission received.  Attempted call to patient regarding ICM remote transmission and left detailed message per DPR.  Advised to return call for any fluid symptoms or questions. Next ICM remote transmission scheduled 12/29/2020.

## 2020-12-29 ENCOUNTER — Ambulatory Visit (INDEPENDENT_AMBULATORY_CARE_PROVIDER_SITE_OTHER): Payer: Medicare Other

## 2020-12-29 DIAGNOSIS — I5022 Chronic systolic (congestive) heart failure: Secondary | ICD-10-CM

## 2020-12-29 DIAGNOSIS — Z95810 Presence of automatic (implantable) cardiac defibrillator: Secondary | ICD-10-CM

## 2020-12-30 ENCOUNTER — Other Ambulatory Visit: Payer: Self-pay | Admitting: Cardiology

## 2020-12-30 DIAGNOSIS — I2510 Atherosclerotic heart disease of native coronary artery without angina pectoris: Secondary | ICD-10-CM

## 2020-12-30 NOTE — Progress Notes (Signed)
EPIC Encounter for ICM Monitoring  Patient Name: [name] [name] is a 66 y.o. male Date: 12/30/2020 Primary Care Physican: Curly Rim, MD Primary Cardiologist: [name] Electrophysiologist: Lovena Le Weight: unable to stand for weights    Battery Longevity: 3.7 months   Spoke with patient and heart failure questions reviewed.  Pt asymptomatic for fluid accumulation.  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  He reports it has stated ALS is starting to affect his arms also.   Corvue thoracic impedance suggesting possible fluid accumulation starting 12/27/2020.  Impedance also decreased from 9/22-9/26.   Prescribed: Furosemide 20 mg take 1 tablet as needed.  Pt taking daily instead of PRN   Labs: 11/06/2020 Creatinine 0.73, BUN 16, Potassium 4.1, Sodium 136 A complete set of results can be found in Results Review.  Recommendations:   Advised to take Furosemide 40 mg x 1 day only and then return to his previous dosage.    Follow-up plan: ICM clinic phone appointment on 01/12/2021 to recheck fluid levels.  91 day device clinic remote transmission 02/23/2021.      EP/Cardiology Office Visits: Recall for 03/05/2020 with Dr Lovena Le.  02/02/2021 with Dr [name]   Copy of ICM check sent to Dr. Lovena Le and Dr [name].    3 month ICM trend: 12/29/2020.    1 Year ICM trend:       Rosalene Billings, RN 12/30/2020 7:54 AM

## 2021-01-01 ENCOUNTER — Other Ambulatory Visit: Payer: Self-pay

## 2021-01-01 MED ORDER — SPIRONOLACTONE 25 MG PO TABS
12.5000 mg | ORAL_TABLET | Freq: Every day | ORAL | 3 refills | Status: DC
Start: 2021-01-01 — End: 2022-02-04

## 2021-01-12 ENCOUNTER — Ambulatory Visit: Payer: Medicare Other

## 2021-01-12 DIAGNOSIS — I5022 Chronic systolic (congestive) heart failure: Secondary | ICD-10-CM

## 2021-01-12 DIAGNOSIS — Z95810 Presence of automatic (implantable) cardiac defibrillator: Secondary | ICD-10-CM

## 2021-01-14 NOTE — Progress Notes (Signed)
EPIC Encounter for ICM Monitoring  Patient Name: [name] [name] is a 65 y.o. male Date: 01/14/2021 Primary Care Physican: Curly Rim, MD Primary Cardiologist: [name] Electrophysiologist: Lovena Le Weight: unable to stand for weights    Battery Longevity: 3.7 months   Spoke with patient and heart failure questions reviewed.  Pt asymptomatic for fluid accumulation.     Corvue thoracic impedance suggesting fluid levels returned to normal.   Prescribed: Furosemide 20 mg take 1 tablet as needed.  Pt taking daily instead of PRN   Labs: 11/06/2020 Creatinine 0.73, BUN 16, Potassium 4.1, Sodium 136 A complete set of results can be found in Results Review.   Recommendations:  No changes and encouraged to call if experiencing any fluid symptoms.   Follow-up plan: ICM clinic phone appointment on 02/09/2021.  91 day device clinic remote transmission 02/23/2021.      EP/Cardiology Office Visits: Recall for 03/05/2020 with Dr Lovena Le.  02/02/2021 with Dr [name]   Copy of ICM check sent to Dr. Lovena Le.   3 month ICM trend: 01/12/2021.    1 Year ICM trend:       Rosalene Billings, RN 01/14/2021 1:02 PM

## 2021-01-20 ENCOUNTER — Telehealth: Payer: Self-pay

## 2021-01-20 LAB — CUP PACEART REMOTE DEVICE CHECK
Battery Remaining Longevity: 0 mo
Battery Voltage: 2.59 V
Brady Statistic RV Percent Paced: 1 %
Date Time Interrogation Session: 20221025113432
HighPow Impedance: 79 Ohm
HighPow Impedance: 79 Ohm
Implantable Lead Implant Date: 20130620
Implantable Lead Location: 753860
Implantable Lead Model: 181
Implantable Lead Serial Number: 316507
Implantable Pulse Generator Implant Date: 20130620
Lead Channel Impedance Value: 580 Ohm
Lead Channel Pacing Threshold Amplitude: 0.75 V
Lead Channel Pacing Threshold Pulse Width: 0.5 ms
Lead Channel Sensing Intrinsic Amplitude: 12 mV
Lead Channel Setting Pacing Amplitude: 2.5 V
Lead Channel Setting Pacing Pulse Width: 0.5 ms
Lead Channel Setting Sensing Sensitivity: 0.5 mV
Pulse Gen Serial Number: 1033528

## 2021-01-20 NOTE — Telephone Encounter (Signed)
The patient states he felt some vibrations from his ICD. I helped the patient send a transmission. It looks like the patient has reached ERI as of today. I told him I will have the nurse to call him. I let him know as well that since he sent the transmission that his ICD should not vibrate again. I told him he may need a appointment with Dr. Lovena Le to discuss getting his gen change. The patient verbalized understanding.

## 2021-01-22 ENCOUNTER — Ambulatory Visit (INDEPENDENT_AMBULATORY_CARE_PROVIDER_SITE_OTHER): Payer: Medicare Other

## 2021-01-22 DIAGNOSIS — I5022 Chronic systolic (congestive) heart failure: Secondary | ICD-10-CM

## 2021-01-26 NOTE — Progress Notes (Signed)
HPI: FU coronary artery disease. He had an acute anterior infarct in 2003. At that time, he had PCI of his LAD and diagonal. Note, the Lcx and RCA had no disease. Carotid Dopplers in June of 2009 showed normal carotids. Patient had ICD placed in June of 2013. Nuclear study December 2018 showed ejection fraction 25%.  There was prior infarct but no ischemia.  Echocardiogram July 2022 showed ejection fraction 25 to 30%, anteroseptal, inferior and apical akinesis, mild left ventricular hypertrophy, grade 1 diastolic dysfunction.  Since last seen, denies dyspnea, chest pain, palpitations or syncope.  He does have ankle edema.  His OptiVol level was increased today.  Current Outpatient Medications  Medication Sig Dispense Refill   aspirin EC 81 MG tablet Take 1 tablet (81 mg total) by mouth daily. 90 tablet 3   atorvastatin (LIPITOR) 80 MG tablet TAKE 1 TABLET BY MOUTH  DAILY 90 tablet 3   carvedilol (COREG) 12.5 MG tablet TAKE 1 TABLET BY MOUTH  TWICE DAILY WITH A MEAL 180 tablet 1   furosemide (LASIX) 20 MG tablet Take 1 tablet (20 mg total) by mouth daily as needed. 90 tablet 3   riluzole (RILUTEK) 50 MG tablet Take 50 mg by mouth 2 (two) times daily.     sacubitril-valsartan (ENTRESTO) 24-26 MG Take 1 tablet by mouth 2 (two) times daily. 60 tablet 11   spironolactone (ALDACTONE) 25 MG tablet Take 0.5 tablets (12.5 mg total) by mouth daily. 45 tablet 3   CIALIS 20 MG tablet  (Patient not taking: Reported on 02/02/2021)     No current facility-administered medications for this visit.     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   Anginal pain (HCC)    NO RECENT ANGINA   Bladder cancer ([place])    CAD    CARDIOMYOPATHY, ISCHEMIC    CHF    Chronic kidney disease    HYPERLIPIDEMIA    HYPERTENSION    ICD (implantable cardiac defibrillator) in place 09/16/2011   Left ventricular  dysfunction    Myocardial infarction [facility]) 2003    Past Surgical History:  Procedure Laterality Date   CARDIAC CATHETERIZATION     CYSTOSCOPY W/ URETERAL STENT PLACEMENT Left 10/03/2013   Procedure: CYSTOSCOPY WITH RETROGRADE PYELOGRAM/URETERAL STENT PLACEMENT;  Surgeon: Sharyn Creamer, MD;  Location: [facility];  Service: Urology;  Laterality: Left;   CYSTOSCOPY W/ URETERAL STENT PLACEMENT Left 03/15/2014   Procedure: CYSTOSCOPY WITH RETROGRADE PYELOGRAM/URETERAL STENT PLACEMENT;  Surgeon: Alexis Frock, MD;  Location: [facility];  Service: Urology;  Laterality: Left;   CYSTOSCOPY WITH RETROGRADE PYELOGRAM, URETEROSCOPY AND STENT PLACEMENT Bilateral 11/14/2013   Procedure: CYSTOSCOPY WITH BILATERAL RETROGRADE PYELOGRAM, LEFT URETEROSCOPY WITH BIOPSY  AND STENT EXCHANGE;  Surgeon: Alexis Frock, MD;  Location: [facility];  Service: Urology;  Laterality: Bilateral;   HERNIA REPAIR  2010   right inguinal   ICD  09/16/2011   IMPLANTABLE CARDIOVERTER DEFIBRILLATOR IMPLANT N/A 09/16/2011   Procedure: IMPLANTABLE CARDIOVERTER DEFIBRILLATOR IMPLANT;  Surgeon: Evans Lance, MD;  Location: [facility] CATH LAB;  Service: Cardiovascular;  Laterality: N/A;   kidney removed  03/17/2014   pt had ear surgery     ROBOT ASSITED LAPAROSCOPIC NEPHROURETERECTOMY Left 03/15/2014   Procedure: ROBOT ASSITED LAPAROSCOPIC NEPHROURETERECTOMY;  Surgeon: Alexis Frock, MD;  [facility]: [facility];  Service: Urology;  Laterality: Left;   TRANSURETHRAL RESECTION OF BLADDER TUMOR WITH GYRUS (TURBT-GYRUS)  N/A 10/03/2013   Procedure: TRANSURETHRAL RESECTION OF BLADDER TUMOR WITH GYRUS (TURBT-GYRUS);  Surgeon: Sharyn Creamer, MD;  [facility];  Service: Urology;  Laterality: N/A;   TRANSURETHRAL RESECTION OF BLADDER TUMOR WITH GYRUS (TURBT-GYRUS) N/A 11/14/2013   Procedure: TRANSURETHRAL RESECTION OF BLADDER TUMOR WITH GYRUS (TURBT-GYRUS);  Surgeon: Alexis Frock, MD;  [facility]: [facility];  Service: Urology;  Laterality: N/A;    Social  History   Socioeconomic History   Marital status: Married    Spouse name: Not on file   Number of children: Not on file   Years of education: Not on file   Highest education level: Not on file  Occupational History   Not on file  Tobacco Use   Smoking status: Former    Packs/day: 0.50    Years: 6.00    Pack years: 3.00    Types: Cigarettes    Quit date: 09/16/1971    Years since quitting: 49.4   Smokeless tobacco: Never  Vaping Use   Vaping Use: Never used  Substance and Sexual Activity   Alcohol use: No   Drug use: No   Sexual activity: Yes  Other Topics Concern   Not on file  Social History Narrative   Not on file   Social Determinants of Health   Financial Resource Strain: Not on file  Food Insecurity: Not on file  Transportation Needs: Not on file  Physical Activity: Not on file  Stress: Not on file  Social Connections: Not on file  Intimate Partner Violence: Not on file    Family History  Problem Relation Age of Onset   Cancer Father        prostate   Heart Problems Maternal Uncle    Cancer Paternal Uncle        lung   Cancer Paternal Grandmother        lung   Cancer Brother        lung   Heart failure Unknown     ROS: no fevers or chills, productive cough, hemoptysis, dysphasia, odynophagia, melena, hematochezia, dysuria, hematuria, rash, seizure activity, orthopnea, PND, pedal edema, claudication. Remaining systems are negative.  Physical Exam: Well-developed well-nourished in no acute distress.  Skin is warm and dry.  HEENT is normal.  Neck is supple.  Chest is clear to auscultation with normal expansion.  Cardiovascular exam is regular rate and rhythm.  Abdominal exam nontender or distended. No masses palpated. Extremities show 1+ edema. neuro grossly intact  A/P  1 coronary artery disease-he denies chest pain.  Plan to continue aspirin and statin.  2 ischemic cardiomyopathy-continue Entresto and carvedilol.  Continue spironolactone at  present dose.  Mildly volume overloaded.  Increase Lasix to 40 mg daily for 2 days then resume 20 mg daily.  3 chronic systolic congestive heart failure-continue Lasix as outlined above and spironolactone.  Add Farxiga 10 mg daily.  In 1 week check potassium and renal function.  4 hypertension-patient's blood pressure is controlled.  Continue present medications and follow.  5 hyperlipidemia-continue statin.  6 ALS-continues to progress.  Management per neurology.  7 ICD-followed by electrophysiology.  Kirk Ruths, MD

## 2021-01-30 NOTE — Progress Notes (Signed)
Remote ICD transmission.   

## 2021-02-02 ENCOUNTER — Other Ambulatory Visit: Payer: Self-pay

## 2021-02-02 ENCOUNTER — Ambulatory Visit (INDEPENDENT_AMBULATORY_CARE_PROVIDER_SITE_OTHER): Payer: Medicare Other | Admitting: Cardiology

## 2021-02-02 ENCOUNTER — Telehealth: Payer: Self-pay

## 2021-02-02 ENCOUNTER — Encounter: Payer: Self-pay | Admitting: Cardiology

## 2021-02-02 ENCOUNTER — Ambulatory Visit (INDEPENDENT_AMBULATORY_CARE_PROVIDER_SITE_OTHER): Payer: Medicare Other

## 2021-02-02 VITALS — BP 102/68 | HR 65 | Ht 68.0 in | Wt 170.0 lb

## 2021-02-02 DIAGNOSIS — Z95810 Presence of automatic (implantable) cardiac defibrillator: Secondary | ICD-10-CM

## 2021-02-02 DIAGNOSIS — I5022 Chronic systolic (congestive) heart failure: Secondary | ICD-10-CM

## 2021-02-02 DIAGNOSIS — E7800 Pure hypercholesterolemia, unspecified: Secondary | ICD-10-CM | POA: Diagnosis not present

## 2021-02-02 DIAGNOSIS — I255 Ischemic cardiomyopathy: Secondary | ICD-10-CM | POA: Diagnosis not present

## 2021-02-02 DIAGNOSIS — I2510 Atherosclerotic heart disease of native coronary artery without angina pectoris: Secondary | ICD-10-CM | POA: Diagnosis not present

## 2021-02-02 DIAGNOSIS — I10 Essential (primary) hypertension: Secondary | ICD-10-CM

## 2021-02-02 MED ORDER — DAPAGLIFLOZIN PROPANEDIOL 10 MG PO TABS: 10.0000 mg | ORAL_TABLET | Freq: Every day | ORAL | 11 refills | Status: DC

## 2021-02-02 NOTE — Telephone Encounter (Signed)
Remote ICM transmission received.  Attempted call to patient regarding ICM remote transmission and left detailed message per DPR.  Advised to return call for any fluid symptoms or questions. Next ICM remote transmission scheduled 02/10/2021.

## 2021-02-02 NOTE — Progress Notes (Signed)
EPIC Encounter for ICM Monitoring  Patient Name: [name] [name] is a 65 y.o. male Date: 02/02/2021 Primary Care Physican: Curly Rim, MD Primary Cardiologist: [name] Electrophysiologist: Lovena Le Weight: unable to stand for weights    Device ERI reached on 01/20/2021   Attempted call to patient and unable to reach.  Left detailed message per DPR regarding transmission. Transmission reviewed.    Corvue thoracic impedance suggesting possible fluid accumulation starting 01/24/2021.   Prescribed: Furosemide 20 mg take 1 tablet as needed.  Pt taking daily instead of PRN   Labs: 11/06/2020 Creatinine 0.73, BUN 16, Potassium 4.1, Sodium 136 A complete set of results can be found in Results Review.   Recommendations:   Left voice mail with ICM number and encouraged to call if experiencing any fluid symptoms.  Copy sent to Dr [name] since [place] scheduled for today, 11/7.   Follow-up plan: ICM clinic phone appointment on 02/10/2021 for 31 day and recheck fluid levels.  91 day device clinic remote transmission 02/23/2021.      EP/Cardiology Office Visits: 02/05/2020 with Dr Lovena Le.  02/02/2021 with Dr [name]   Copy of ICM check sent to Dr. Lovena Le.    3 month ICM trend: 02/02/2021.    1 Year ICM trend:       Rosalene Billings, RN 02/02/2021 9:53 AM

## 2021-02-02 NOTE — Patient Instructions (Signed)
Medication Instructions:   START FARGIXA 10 MG ONCE DAILY  *If you need a refill on your cardiac medications before your next appointment, please call your pharmacy*   Lab Work:  Your physician recommends that you return for lab work in:ONE [place]  If you have labs (blood work) drawn today and your tests are completely normal, you will receive your results only by: [place] (if you have MyChart) OR A paper copy in the mail If you have any lab test that is abnormal or we need to change your treatment, we will call you to review the results.   Follow-Up: At [facility], you and your health needs are our priority.  As part of our continuing mission to provide you with exceptional heart care, we have created designated Provider Care Teams.  These Care Teams include your primary Cardiologist (physician) and Advanced Practice Providers (APPs -  Physician Assistants and Nurse Practitioners) who all work together to provide you with the care you need, when you need it.  We recommend signing up for the patient portal called "MyChart".  Sign up information is provided on this After Visit Summary.  MyChart is used to connect with patients for Virtual Visits (Telemedicine).  Patients are able to view lab/test results, encounter notes, upcoming appointments, etc.  Non-urgent messages can be sent to your provider as well.   To learn more about what you can do with MyChart, go to [email or web address].    Your next appointment:   6 month(s)  The format for your next appointment:   In Person  Provider:   Kirk Ruths MD

## 2021-02-04 ENCOUNTER — Ambulatory Visit (INDEPENDENT_AMBULATORY_CARE_PROVIDER_SITE_OTHER): Payer: Medicare Other | Admitting: Internal Medicine

## 2021-02-04 ENCOUNTER — Other Ambulatory Visit: Payer: Self-pay

## 2021-02-04 VITALS — BP 92/60 | HR 68 | Ht 68.0 in | Wt 168.0 lb

## 2021-02-04 DIAGNOSIS — I428 Other cardiomyopathies: Secondary | ICD-10-CM | POA: Diagnosis not present

## 2021-02-04 DIAGNOSIS — I255 Ischemic cardiomyopathy: Secondary | ICD-10-CM

## 2021-02-04 DIAGNOSIS — I2510 Atherosclerotic heart disease of native coronary artery without angina pectoris: Secondary | ICD-10-CM | POA: Diagnosis not present

## 2021-02-04 DIAGNOSIS — I10 Essential (primary) hypertension: Secondary | ICD-10-CM | POA: Diagnosis not present

## 2021-02-04 DIAGNOSIS — Z95810 Presence of automatic (implantable) cardiac defibrillator: Secondary | ICD-10-CM | POA: Diagnosis not present

## 2021-02-04 NOTE — Progress Notes (Signed)
HPI [name] [name] returns today for followup. He is a pleasant 65 yo man with a h/o an ICM, chronic systolic heart failure, CAD s/p PCI, and s/p ICD insertion. He has reached ERI. Unfortunately he has developed ALS 2 years ago and has had gradual worsening of his neurological function. He has severe leg weakness and can no longer walk and his arms have gradually weakened.  Allergies  Allergen Reactions   Plavix [Clopidogrel] Itching and Rash     Current Outpatient Medications  Medication Sig Dispense Refill   aspirin EC 81 MG tablet Take 1 tablet (81 mg total) by mouth daily. 90 tablet 3   atorvastatin (LIPITOR) 80 MG tablet TAKE 1 TABLET BY MOUTH  DAILY 90 tablet 3   carvedilol (COREG) 12.5 MG tablet TAKE 1 TABLET BY MOUTH  TWICE DAILY WITH A MEAL 180 tablet 1   CIALIS 20 MG tablet  (Patient not taking: Reported on 02/02/2021)     dapagliflozin propanediol (FARXIGA) 10 MG TABS tablet Take 1 tablet (10 mg total) by mouth daily before breakfast. 30 tablet 11   furosemide (LASIX) 20 MG tablet Take 1 tablet (20 mg total) by mouth daily as needed. 90 tablet 3   riluzole (RILUTEK) 50 MG tablet Take 50 mg by mouth 2 (two) times daily.     sacubitril-valsartan (ENTRESTO) 24-26 MG Take 1 tablet by mouth 2 (two) times daily. 60 tablet 11   spironolactone (ALDACTONE) 25 MG tablet Take 0.5 tablets (12.5 mg total) by mouth daily. 45 tablet 3   No current facility-administered medications for this visit.     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   Past Surgical History:  Procedure Laterality Date   CARDIAC CATHETERIZATION     CYSTOSCOPY W/ URETERAL STENT PLACEMENT Left 10/03/2013   Procedure: CYSTOSCOPY WITH RETROGRADE PYELOGRAM/URETERAL STENT PLACEMENT;  Surgeon: Sharyn Creamer, MD;  [facility];  Service: Urology;  Laterality: Left;   CYSTOSCOPY W/ URETERAL STENT PLACEMENT Left 03/15/2014   Procedure: CYSTOSCOPY WITH RETROGRADE PYELOGRAM/URETERAL STENT PLACEMENT;  Surgeon: Alexis Frock, MD;  [facility];  Service: Urology;  Laterality: Left;   CYSTOSCOPY WITH RETROGRADE PYELOGRAM, URETEROSCOPY AND STENT PLACEMENT Bilateral 11/14/2013   Procedure: CYSTOSCOPY WITH BILATERAL RETROGRADE PYELOGRAM, LEFT URETEROSCOPY WITH BIOPSY  AND STENT EXCHANGE;  Surgeon: Alexis Frock, MD;  [facility];  Service: Urology;  Laterality: Bilateral;   HERNIA REPAIR  2010   right inguinal   ICD  09/16/2011   IMPLANTABLE CARDIOVERTER DEFIBRILLATOR IMPLANT N/A 09/16/2011   Procedure: IMPLANTABLE CARDIOVERTER DEFIBRILLATOR IMPLANT;  Surgeon: Evans Lance, MD;  Location: [facility] CATH LAB;  Service: Cardiovascular;  Laterality: N/A;   kidney removed  03/17/2014   pt had ear surgery     ROBOT ASSITED LAPAROSCOPIC NEPHROURETERECTOMY Left 03/15/2014   Procedure: ROBOT ASSITED LAPAROSCOPIC NEPHROURETERECTOMY;  Surgeon: Alexis Frock, MD;  [facility];  Service: Urology;  Laterality: Left;  TRANSURETHRAL RESECTION OF BLADDER TUMOR WITH GYRUS (TURBT-GYRUS) N/A 10/03/2013   Procedure: TRANSURETHRAL RESECTION OF BLADDER TUMOR WITH GYRUS (TURBT-GYRUS);  Surgeon: Sharyn Creamer, MD;  [facility];  Service: Urology;  Laterality: N/A;   TRANSURETHRAL RESECTION OF BLADDER TUMOR WITH GYRUS (TURBT-GYRUS) N/A 11/14/2013   Procedure: TRANSURETHRAL RESECTION OF BLADDER TUMOR WITH GYRUS (TURBT-GYRUS);  Surgeon: Alexis Frock, MD;  [facility];  Service: Urology;  Laterality: N/A;     Family History   Problem Relation Age of Onset   Cancer Father        prostate   Heart Problems Maternal Uncle    Cancer Paternal Uncle        lung   Cancer Paternal Grandmother        lung   Cancer Brother        lung   Heart failure Unknown      Social History   Socioeconomic History   Marital status: Married    Spouse name: Not on file   Number of children: Not on file   Years of education: Not on file   Highest education level: Not on file  Occupational History   Not on file  Tobacco Use   Smoking status: Former    Packs/day: 0.50    Years: 6.00    Pack years: 3.00    Types: Cigarettes    Quit date: 09/16/1971    Years since quitting: 49.4   Smokeless tobacco: Never  Vaping Use   Vaping Use: Never used  Substance and Sexual Activity   Alcohol use: No   Drug use: No   Sexual activity: Yes  Other Topics Concern   Not on file  Social History Narrative   Not on file   Social Determinants of Health   Financial Resource Strain: Not on file  Food Insecurity: Not on file  Transportation Needs: Not on file  Physical Activity: Not on file  Stress: Not on file  Social Connections: Not on file  Intimate Partner Violence: Not on file     BP 92/60   Pulse 68   Ht 5\' 8"  (1.727 m)   Wt 168 lb (76.2 kg)   SpO2 95%   BMI 25.54 kg/m   Physical Exam:  Well appearing NAD HEENT: Unremarkable Neck:  No JVD, no thyromegally Lymphatics:  No adenopathy Back:  No CVA tenderness Lungs:  Clear HEART:  Regular rate rhythm, no murmurs, no rubs, no clicks Abd:  soft, positive bowel sounds, no organomegally, no rebound, no guarding Ext:  2 plus pulses, no edema, no cyanosis, no clubbing Skin:  No rashes no nodules Neuro:  CN II through XII intact, motor grossly intact  EKG - NSR with AS MI  DEVICE  Normal device function.  See PaceArt for details. ERI  Assess/Plan:  ICD - he has never had an ICD shock, and he has ALS with a poor long term prognosis. I have reviewed the pro's  and con's of not placing the ICD as well as placing it. At the present time, I do not think ICD gen replacement is warranted. I have explained my thinking to the patient and his wife and daughter and they agree. I will also concur with Dr. [name] who cares for him and knows him well. Chronic systolic heart failure - his symptoms are class 2 but he is limited by ALS. ALS - he will continue [facility] at [facility].  CAD - he is s/p PCI remotely. He denies anginal  symptoms.   Carleene Overlie Chailyn Racette,MD

## 2021-02-04 NOTE — Patient Instructions (Addendum)
Medication Instructions:  Your physician recommends that you continue on your current medications as directed. Please refer to the Current Medication list given to you today.  Labwork: None ordered.  Testing/Procedures: None ordered.  Follow-Up: Your physician wants you to follow-up based on further discussion.  Any Other Special Instructions Will Be Listed Below (If Applicable).  If you need a refill on your cardiac medications before your next appointment, please call your pharmacy.

## 2021-02-10 ENCOUNTER — Ambulatory Visit (INDEPENDENT_AMBULATORY_CARE_PROVIDER_SITE_OTHER): Payer: Medicare Other

## 2021-02-10 DIAGNOSIS — I5022 Chronic systolic (congestive) heart failure: Secondary | ICD-10-CM

## 2021-02-10 DIAGNOSIS — Z95810 Presence of automatic (implantable) cardiac defibrillator: Secondary | ICD-10-CM

## 2021-02-11 NOTE — Progress Notes (Signed)
EPIC Encounter for ICM Monitoring  Patient Name: [name] [name] is a 65 y.o. male Date: 02/11/2021 Primary Care Physican: Curly Rim, MD Primary Cardiologist: [name] Electrophysiologist: Lovena Le Weight: unable to stand for weights      Transmission reviewed.    Corvue thoracic impedance suggesting fluid levels returned to normal after taking extra Lasix as recommended at OV with Dr [name] 11/7.   Prescribed: Furosemide 20 mg take 1 tablet as needed.  Pt taking daily instead of PRN   Labs: 11/06/2020 Creatinine 0.73, BUN 16, Potassium 4.1, Sodium 136 A complete set of results can be found in Results Review.   Recommendations: No changes   Follow-up plan: ICM clinic phone appointment on 03/09/2021.  91 day device clinic remote transmission 02/23/2021.      EP/Cardiology Office Visits:  Recall 08/01/2021 with Dr [name]   Copy of ICM check sent to Dr. Lovena Le.     3 month ICM trend: 02/10/2021.    12-14 Month ICM trend:       Rosalene Billings, RN 02/11/2021 8:45 AM

## 2021-02-19 LAB — BASIC METABOLIC PANEL
BUN/Creatinine Ratio: 43 (calc) — ABNORMAL HIGH (ref 6–22)
BUN: 35 mg/dL — ABNORMAL HIGH (ref 7–25)
CO2: 26 mmol/L (ref 20–32)
Calcium: 9 mg/dL (ref 8.6–10.3)
Chloride: 103 mmol/L (ref 98–110)
Creat: 0.82 mg/dL (ref 0.70–1.35)
Glucose, Bld: 88 mg/dL (ref 65–139)
Potassium: 4.5 mmol/L (ref 3.5–5.3)
Sodium: 137 mmol/L (ref 135–146)

## 2021-02-24 ENCOUNTER — Other Ambulatory Visit: Payer: Self-pay | Admitting: *Deleted

## 2021-02-24 MED ORDER — SACUBITRIL-VALSARTAN 24-26 MG PO TABS: 1.0000 | ORAL_TABLET | Freq: Two times a day (BID) | ORAL | 3 refills | Status: DC

## 2021-03-19 NOTE — Progress Notes (Signed)
No ICM remote transmission received for 03/09/2021 and next ICM transmission scheduled for 04/13/2021.   °

## 2021-04-07 NOTE — Telephone Encounter (Signed)
Patient has been approved for entresto patient assistance until 03/28/22.

## 2021-04-10 ENCOUNTER — Telehealth: Payer: Self-pay | Admitting: Cardiology

## 2021-04-10 NOTE — Telephone Encounter (Signed)
Pt is stating that Wilder Glade is $550 at the pharmacy.... would like to speak w/ nurse Debra in regards to options... please advise

## 2021-04-10 NOTE — Telephone Encounter (Signed)
Left a message for the patient to call back.  

## 2021-04-13 NOTE — Telephone Encounter (Signed)
Spoke with pt, he is not able to afford the farxiga. Patient assistance paperwork and samples placed at the front desk for patient pick up.

## 2021-04-14 ENCOUNTER — Telehealth: Payer: Self-pay | Admitting: *Deleted

## 2021-04-14 NOTE — Telephone Encounter (Signed)
Patient assistance application for farxiga faxed to the company

## 2021-04-15 NOTE — Progress Notes (Signed)
No ICM remote transmission received for 04/13/2021 and next ICM transmission scheduled for 04/28/2021.

## 2021-04-24 NOTE — Telephone Encounter (Signed)
Left message for patient, he is approved for the assistance.

## 2021-04-28 NOTE — Progress Notes (Signed)
Unable to reach patient for monthly ICM remote follow up and not receiving monthly remote transmission.  Patient disenrolled due patient is not actively participating in ICM clinic.  Device clinic 91 day remote monitoring will continue per protocol.   ?

## 2021-05-09 ENCOUNTER — Other Ambulatory Visit: Payer: Self-pay | Admitting: Cardiology

## 2021-05-09 DIAGNOSIS — I2510 Atherosclerotic heart disease of native coronary artery without angina pectoris: Secondary | ICD-10-CM

## 2021-06-30 ENCOUNTER — Other Ambulatory Visit: Payer: Self-pay

## 2021-06-30 MED ORDER — FUROSEMIDE 20 MG PO TABS: 20.0000 mg | ORAL_TABLET | Freq: Every day | ORAL | 1 refills | Status: DC | PRN

## 2021-08-02 ENCOUNTER — Other Ambulatory Visit: Payer: Self-pay | Admitting: Cardiology

## 2021-08-02 DIAGNOSIS — I2510 Atherosclerotic heart disease of native coronary artery without angina pectoris: Secondary | ICD-10-CM

## 2021-08-27 ENCOUNTER — Telehealth: Payer: Self-pay | Admitting: Cardiology

## 2021-08-27 DIAGNOSIS — I2510 Atherosclerotic heart disease of native coronary artery without angina pectoris: Secondary | ICD-10-CM

## 2021-08-27 NOTE — Telephone Encounter (Signed)
Follow Up:     Patient is returning Kryestyn's call from today.

## 2021-08-27 NOTE — Telephone Encounter (Signed)
Left message to call back  

## 2021-08-27 NOTE — Telephone Encounter (Signed)
Pt c/o medication issue:  1. Name of Medication: Carvedilol  2. How are you currently taking this medication (dosage and times per day)? 1 tablet  2 times a day  3. Are you having a reaction (difficulty breathing--STAT)?   4. What is your medication issue? Patient wanted Dr [name] to know his primary have changed his dose his Carvedilol. He now take a 1/2 tablet 2 times a day.

## 2021-08-27 NOTE — Telephone Encounter (Signed)
Patient wanted Dr [name] to know that his Primary's office   [name]  - reduced carvedilol dosage to 1/2 tablet of 12.5 mg   equal 6.25 mg  twice a day .  Patient states  he was having issue with dizziness , vomiting and low blood pressure. Patient states  after reducing medication for 2 weeks symptoms  went away.    Per patient ,he was instructed to continue with the reduction of carvedilol. He wanted to let Dr [name] be aware.  RN changed Medication on medication list.  Also made an appointment for regular 6 month follow up ( April /May 2023) - next available was in Aug 2023 - schedule for 11/18/21 at 4 pm  in [place].

## 2021-10-22 ENCOUNTER — Encounter: Payer: Self-pay | Admitting: *Deleted

## 2021-11-05 NOTE — Progress Notes (Signed)
HPI: FU coronary artery disease. He had an acute anterior infarct in 2003. At that time, he had PCI of his LAD and diagonal. Note, the Lcx and RCA had no disease. Carotid Dopplers in June of 2009 showed normal carotids. Patient had ICD placed in June of 2013. Nuclear study December 2018 showed ejection fraction 25%.  There was prior infarct but no ischemia. Echocardiogram July 2022 showed ejection fraction 25 to 30%, anteroseptal, inferior and apical akinesis, mild left ventricular hypertrophy, grade 1 diastolic dysfunction.  Also now with progressive ALS. Since last seen, patient denies dyspnea, chest pain, palpitations or syncope.  He has mild chronic pedal edema.  His ALS is worsening.  Current Outpatient Medications  Medication Sig Dispense Refill   aspirin EC 81 MG tablet Take 1 tablet (81 mg total) by mouth daily. 90 tablet 3   atorvastatin (LIPITOR) 80 MG tablet TAKE 1 TABLET BY MOUTH  DAILY 90 tablet 3   carvedilol (COREG) 6.25 MG tablet Take 6.25 mg by mouth 2 (two) times daily with a meal. Take 6.25 mg ( 1/2 tablet of 12.5 mg ) twice a day     dapagliflozin propanediol (FARXIGA) 10 MG TABS tablet Take 1 tablet (10 mg total) by mouth daily before breakfast. 30 tablet 11   furosemide (LASIX) 20 MG tablet TAKE 1 TABLET BY MOUTH DAILY AS  NEEDED 90 tablet 0   LORazepam (ATIVAN) 1 MG tablet Take 1 mg by mouth 2 (two) times daily.     meclizine (ANTIVERT) 25 MG tablet Take by mouth.     RELYVRIO 3-1 g PACK Take by mouth.     riluzole (RILUTEK) 50 MG tablet Take 50 mg by mouth 2 (two) times daily.     sacubitril-valsartan (ENTRESTO) 24-26 MG Take 1 tablet by mouth 2 (two) times daily. 180 tablet 3   spironolactone (ALDACTONE) 25 MG tablet Take 0.5 tablets (12.5 mg total) by mouth daily. 45 tablet 3   No current facility-administered medications for this visit.     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   Anginal pain (HCC)    NO RECENT ANGINA   Bladder cancer ([place])    CAD    CARDIOMYOPATHY, ISCHEMIC    CHF    Chronic kidney disease    HYPERLIPIDEMIA    HYPERTENSION    ICD (implantable cardiac defibrillator) in place 09/16/2011   Left ventricular dysfunction    Myocardial infarction [facility]) 2003    Past Surgical History:  Procedure Laterality Date   CARDIAC CATHETERIZATION     CYSTOSCOPY W/ URETERAL STENT PLACEMENT Left 10/03/2013   Procedure: CYSTOSCOPY WITH RETROGRADE PYELOGRAM/URETERAL STENT PLACEMENT;  Surgeon: Sharyn Creamer, MD;  [facility];  Service: Urology;  Laterality: Left;   CYSTOSCOPY W/ URETERAL STENT PLACEMENT Left 03/15/2014   Procedure: CYSTOSCOPY WITH RETROGRADE PYELOGRAM/URETERAL STENT PLACEMENT;  Surgeon: Alexis Frock, MD;  [facility];  Service: Urology;  Laterality: Left;   CYSTOSCOPY WITH RETROGRADE PYELOGRAM, URETEROSCOPY AND STENT PLACEMENT Bilateral 11/14/2013   Procedure: CYSTOSCOPY WITH BILATERAL RETROGRADE PYELOGRAM, LEFT URETEROSCOPY WITH BIOPSY  AND STENT EXCHANGE;  Surgeon: Alexis Frock, MD;  [facility];  Service: Urology;  Laterality: Bilateral;   HERNIA REPAIR  2010   right inguinal   ICD  09/16/2011   IMPLANTABLE CARDIOVERTER DEFIBRILLATOR IMPLANT N/A 09/16/2011   Procedure: IMPLANTABLE CARDIOVERTER DEFIBRILLATOR IMPLANT;  Surgeon: Evans Lance, MD;  Location: [place] CATH LAB;  Service: Cardiovascular;  Laterality: N/A;   kidney removed  03/17/2014   pt had ear surgery     ROBOT ASSITED LAPAROSCOPIC NEPHROURETERECTOMY Left 03/15/2014   Procedure: ROBOT ASSITED LAPAROSCOPIC NEPHROURETERECTOMY;  Surgeon: Alexis Frock, MD;  [facility];  Service: Urology;  Laterality: Left;   TRANSURETHRAL RESECTION OF BLADDER TUMOR WITH GYRUS (TURBT-GYRUS) N/A 10/03/2013   Procedure: TRANSURETHRAL RESECTION OF BLADDER TUMOR WITH GYRUS (TURBT-GYRUS);  Surgeon: Sharyn Creamer, MD;  [facility];  Service:  Urology;  Laterality: N/A;   TRANSURETHRAL RESECTION OF BLADDER TUMOR WITH GYRUS (TURBT-GYRUS) N/A 11/14/2013   Procedure: TRANSURETHRAL RESECTION OF BLADDER TUMOR WITH GYRUS (TURBT-GYRUS);  Surgeon: Alexis Frock, MD;  [facility];  Service: Urology;  Laterality: N/A;    Social History   Socioeconomic History   Marital status: Married    Spouse name: Not on file   Number of children: Not on file   Years of education: Not on file   Highest education level: Not on file  Occupational History   Not on file  Tobacco Use   Smoking status: Former    Packs/day: 0.50    Years: 6.00    Total pack years: 3.00    Types: Cigarettes    Quit date: 09/16/1971    Years since quitting: 50.2   Smokeless tobacco: Never  Vaping Use   Vaping Use: Never used  Substance and Sexual Activity   Alcohol use: No   Drug use: No   Sexual activity: Yes  Other Topics Concern   Not on file  Social History Narrative   Not on file   Social Determinants of Health   Financial Resource Strain: Not on file  Food Insecurity: Not on file  Transportation Needs: Not on file  Physical Activity: Not on file  Stress: Not on file  Social Connections: Not on file  Intimate Partner Violence: Not on file    Family History  Problem Relation Age of Onset   Cancer Father        prostate   Heart Problems Maternal Uncle    Cancer Paternal Uncle        lung   Cancer Paternal Grandmother        lung   Cancer Brother        lung   Heart failure Unknown     ROS: no fevers or chills, productive cough, hemoptysis, dysphasia, odynophagia, melena, hematochezia, dysuria, hematuria, rash, seizure activity, orthopnea, PND, pedal edema, claudication. Remaining systems are negative.  Physical Exam: Well-developed well-nourished in no acute distress.  Skin is warm and dry.  HEENT is normal.  Neck is supple.  Chest is clear to auscultation with normal expansion.  Cardiovascular exam is regular rate and rhythm.   Abdominal exam nontender or distended. No masses palpated. Extremities show 1+ ankle edema. neuro weak in upper < lower extremities from progressive ALS  ECG-normal sinus rhythm at a rate of 60, septal infarct, lateral T wave inversion.  Personally reviewed  A/P  1 coronary artery disease-patient denies chest pain.  Plan to continue medical therapy with aspirin and statin.  2 ischemic cardiomyopathy-continue Entresto and carvedilol.  3 chronic systolic congestive heart failure-continue Lasix, spironolactone and Farxiga at present dose.  Check potassium and renal function.  4 hypertension-blood pressure controlled.  Continue present medical regimen.  5 hyperlipidemia-continue statin.  6 ICD-Per EP.  Per Dr. Tanna Furry notes his generator will not be changed which is appropriate.  7 ALS-Per neurology.  Symptoms are progressing.  Kirk Ruths, MD

## 2021-11-06 ENCOUNTER — Other Ambulatory Visit: Payer: Self-pay | Admitting: Cardiology

## 2021-11-18 ENCOUNTER — Encounter: Payer: Self-pay | Admitting: Cardiology

## 2021-11-18 ENCOUNTER — Ambulatory Visit (INDEPENDENT_AMBULATORY_CARE_PROVIDER_SITE_OTHER): Payer: Medicare Other | Admitting: Cardiology

## 2021-11-18 VITALS — BP 122/72 | HR 60 | Ht 68.0 in | Wt 162.0 lb

## 2021-11-18 DIAGNOSIS — I5022 Chronic systolic (congestive) heart failure: Secondary | ICD-10-CM | POA: Diagnosis not present

## 2021-11-18 DIAGNOSIS — I255 Ischemic cardiomyopathy: Secondary | ICD-10-CM | POA: Diagnosis not present

## 2021-11-18 DIAGNOSIS — I10 Essential (primary) hypertension: Secondary | ICD-10-CM

## 2021-11-18 DIAGNOSIS — I428 Other cardiomyopathies: Secondary | ICD-10-CM

## 2021-11-18 NOTE — Patient Instructions (Signed)

## 2021-11-21 LAB — BASIC METABOLIC PANEL
BUN/Creatinine Ratio: 40 (calc) — ABNORMAL HIGH (ref 6–22)
BUN: 19 mg/dL (ref 7–25)
CO2: 22 mmol/L (ref 20–32)
Calcium: 9.5 mg/dL (ref 8.6–10.3)
Chloride: 105 mmol/L (ref 98–110)
Creat: 0.47 mg/dL — ABNORMAL LOW (ref 0.70–1.35)
Glucose, Bld: 94 mg/dL (ref 65–139)
Potassium: 4.3 mmol/L (ref 3.5–5.3)
Sodium: 138 mmol/L (ref 135–146)

## 2021-11-26 ENCOUNTER — Encounter: Payer: Self-pay | Admitting: *Deleted

## 2022-02-03 ENCOUNTER — Other Ambulatory Visit: Payer: Self-pay | Admitting: Cardiology

## 2022-02-23 ENCOUNTER — Encounter: Payer: Self-pay | Admitting: *Deleted

## 2022-03-02 ENCOUNTER — Telehealth: Payer: Self-pay | Admitting: Cardiology

## 2022-03-02 NOTE — Telephone Encounter (Signed)
Pt states that he would like to speak to [name] [name] in regards to his entresto patient assistance forms. Requesting call back.

## 2022-03-02 NOTE — Telephone Encounter (Signed)
Spoke to patient stated he wanted Hilda Blades to know his wife will drop off his patient assistance forms for [employer] tomorrow at the [place] office.I will make Debra aware.

## 2022-03-05 ENCOUNTER — Other Ambulatory Visit: Payer: Self-pay | Admitting: Cardiology

## 2022-03-30 ENCOUNTER — Telehealth: Payer: Self-pay | Admitting: Cardiology

## 2022-03-30 NOTE — Telephone Encounter (Signed)
Patient stated he hasn't taken Entresto 24-26 for 2 days. Sample bottle for him to pick up. Stated daughter will pick up sample bottle. Lot #: [id number]; Exp: 4/26

## 2022-03-30 NOTE — Telephone Encounter (Signed)
  Patient calling the office for samples of medication:   1.  What medication and dosage are you requesting samples for?  sacubitril-valsartan (ENTRESTO) 24-26 MG    2.  Are you currently out of this medication? Yes

## 2022-04-12 ENCOUNTER — Other Ambulatory Visit: Payer: Self-pay | Admitting: Cardiology

## 2022-05-03 NOTE — Progress Notes (Signed)
HPI: FU coronary artery disease. He had an acute anterior infarct in 2003. At that time, he had PCI of his LAD and diagonal. Note, the Lcx and RCA had no disease. Carotid Dopplers in June of 2009 showed normal carotids. Patient had ICD placed in June of 2013. Nuclear study December 2018 showed ejection fraction 25%.  There was prior infarct but no ischemia. Echocardiogram July 2022 showed ejection fraction 25 to 30%, anteroseptal, inferior and apical akinesis, mild left ventricular hypertrophy, grade 1 diastolic dysfunction.  Also now with progressive ALS. Since last seen, he denies dyspnea, chest pain, palpitations or syncope.  Occasional mild pedal edema.  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  Current Outpatient Medications  Medication Sig Dispense Refill   aspirin EC 81 MG tablet Take 1 tablet (81 mg total) by mouth daily. 90 tablet 3   atorvastatin (LIPITOR) 80 MG tablet TAKE 1 TABLET BY MOUTH  DAILY 90 tablet 3   carvedilol (COREG) 6.25 MG tablet Take 6.25 mg by mouth 2 (two) times daily with a meal. Take 6.25 mg ( 1/2 tablet of 12.5 mg ) twice a day     dapagliflozin propanediol (FARXIGA) 10 MG TABS tablet Take 1 tablet (10 mg total) by mouth daily before breakfast. 30 tablet 11   furosemide (LASIX) 20 MG tablet TAKE 1 TABLET BY MOUTH EVERY DAY AS NEEDED 90 tablet 1   LORazepam (ATIVAN) 1 MG tablet Take 1 mg by mouth 2 (two) times daily.     meclizine (ANTIVERT) 25 MG tablet Take by mouth.     RELYVRIO 3-1 g PACK Take by mouth.     riluzole (RILUTEK) 50 MG tablet Take 50 mg by mouth 2 (two) times daily.     sacubitril-valsartan (ENTRESTO) 24-26 MG Take 1 tablet by mouth 2 (two) times daily. 180 tablet 3   spironolactone (ALDACTONE) 25 MG tablet Take 0.5 tablets (12.5 mg total) by mouth daily. Please schedule appointment for additional refills. 45 tablet 0   No current facility-administered medications for this visit.     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   Anginal pain (HCC)    NO RECENT ANGINA   Bladder cancer ([place])    CAD    CARDIOMYOPATHY, ISCHEMIC    CHF    Chronic kidney disease    HYPERLIPIDEMIA    HYPERTENSION    ICD (implantable cardiac defibrillator) in place 09/16/2011   Left ventricular dysfunction    Myocardial infarction [facility]) 2003    Past Surgical History:  Procedure Laterality Date   CARDIAC CATHETERIZATION     CYSTOSCOPY W/ URETERAL STENT PLACEMENT Left 10/03/2013   Procedure: CYSTOSCOPY WITH RETROGRADE PYELOGRAM/URETERAL STENT PLACEMENT;  Surgeon: Sharyn Creamer, MD;  [facility];  Service: Urology;  Laterality: Left;   CYSTOSCOPY W/ URETERAL STENT PLACEMENT Left 03/15/2014   Procedure: CYSTOSCOPY WITH RETROGRADE PYELOGRAM/URETERAL STENT PLACEMENT;  Surgeon: Alexis Frock, MD;  [facility];  Service: Urology;  Laterality: Left;   CYSTOSCOPY WITH RETROGRADE PYELOGRAM, URETEROSCOPY AND STENT PLACEMENT Bilateral 11/14/2013   Procedure: CYSTOSCOPY WITH BILATERAL RETROGRADE PYELOGRAM, LEFT URETEROSCOPY WITH BIOPSY  AND STENT EXCHANGE;  Surgeon: Alexis Frock, MD;  [facility];  Service: Urology;  Laterality: Bilateral;   HERNIA REPAIR  2010   right inguinal   ICD  09/16/2011   IMPLANTABLE CARDIOVERTER DEFIBRILLATOR IMPLANT N/A 09/16/2011   Procedure: IMPLANTABLE CARDIOVERTER DEFIBRILLATOR IMPLANT;  Surgeon: Champ Mungo  Lovena Le, MD;  Location: [facility] CATH LAB;  Service: Cardiovascular;  Laterality: N/A;   kidney removed  03/17/2014   pt had ear surgery     ROBOT ASSITED LAPAROSCOPIC NEPHROURETERECTOMY Left 03/15/2014   Procedure: ROBOT ASSITED LAPAROSCOPIC NEPHROURETERECTOMY;  Surgeon: Alexis Frock, MD;  [facility];  Service: Urology;  Laterality: Left;   TRANSURETHRAL RESECTION OF BLADDER TUMOR WITH GYRUS (TURBT-GYRUS) N/A 10/03/2013   Procedure: TRANSURETHRAL RESECTION OF BLADDER TUMOR WITH GYRUS (TURBT-GYRUS);  Surgeon:  Sharyn Creamer, MD;  [facility];  Service: Urology;  Laterality: N/A;   TRANSURETHRAL RESECTION OF BLADDER TUMOR WITH GYRUS (TURBT-GYRUS) N/A 11/14/2013   Procedure: TRANSURETHRAL RESECTION OF BLADDER TUMOR WITH GYRUS (TURBT-GYRUS);  Surgeon: Alexis Frock, MD;  [facility];  Service: Urology;  Laterality: N/A;    Social History   Socioeconomic History   Marital status: Married    Spouse name: Not on file   Number of children: Not on file   Years of education: Not on file   Highest education level: Not on file  Occupational History   Not on file  Tobacco Use   Smoking status: Former    Packs/day: 0.50    Years: 6.00    Total pack years: 3.00    Types: Cigarettes    Quit date: 09/16/1971    Years since quitting: 50.6   Smokeless tobacco: Never  Vaping Use   Vaping Use: Never used  Substance and Sexual Activity   Alcohol use: No   Drug use: No   Sexual activity: Yes  Other Topics Concern   Not on file  Social History Narrative   Not on file   Social Determinants of Health   Financial Resource Strain: Not on file  Food Insecurity: Not on file  Transportation Needs: Not on file  Physical Activity: Not on file  Stress: Not on file  Social Connections: Not on file  Intimate Partner Violence: Not on file    Family History  Problem Relation Age of Onset   Cancer Father        prostate   Heart Problems Maternal Uncle    Cancer Paternal Uncle        lung   Cancer Paternal Grandmother        lung   Cancer Brother        lung   Heart failure Unknown     ROS: Progressive weakness from ALS but no fevers or chills, productive cough, hemoptysis, dysphasia, odynophagia, melena, hematochezia, dysuria, hematuria, rash, seizure activity, orthopnea, PND, claudication. Remaining systems are negative.  Physical Exam: Well-developed well-nourished in no acute distress.  Skin is warm and dry.  HEENT is normal.  Neck is supple.  Chest is clear to auscultation  with normal expansion.  Cardiovascular exam is regular rate and rhythm.  Abdominal exam nontender or distended. No masses palpated. Extremities show 1+ ankle edema. neuro weak in all 4 extremities from ALS   A/P  1 coronary artery disease-patient doing well from a symptomatic standpoint with no chest pain.  Continue aspirin and statin.  2 ischemic cardiomyopathy-continue Entresto and carvedilol.  3 ICD-as outlined in previous notes his generator will not be changed given his diagnosis of ALS.  4 chronic systolic congestive heart failure-continue Lasix, spironolactone at present dose.  Discontinue Farxiga as he cannot control his urine from ALS and must occasionally wear diapers.  Risk of infection outweighs benefit.  Check potassium and renal function.  5 hypertension-patient's blood pressure is controlled.  Continue  present medical regimen.  6 hyperlipidemia-continue statin.  Check lipids and liver.  7 history of ALS-managed by neurology.  Kirk Ruths, MD

## 2022-05-17 ENCOUNTER — Encounter: Payer: Self-pay | Admitting: Cardiology

## 2022-05-17 ENCOUNTER — Ambulatory Visit (INDEPENDENT_AMBULATORY_CARE_PROVIDER_SITE_OTHER): Payer: Medicare Other | Admitting: Cardiology

## 2022-05-17 VITALS — BP 105/72 | HR 66 | Ht 68.0 in

## 2022-05-17 DIAGNOSIS — I2510 Atherosclerotic heart disease of native coronary artery without angina pectoris: Secondary | ICD-10-CM

## 2022-05-17 DIAGNOSIS — I255 Ischemic cardiomyopathy: Secondary | ICD-10-CM

## 2022-05-17 DIAGNOSIS — I5022 Chronic systolic (congestive) heart failure: Secondary | ICD-10-CM

## 2022-05-17 DIAGNOSIS — I10 Essential (primary) hypertension: Secondary | ICD-10-CM

## 2022-05-17 DIAGNOSIS — E7800 Pure hypercholesterolemia, unspecified: Secondary | ICD-10-CM | POA: Diagnosis not present

## 2022-05-17 NOTE — Patient Instructions (Signed)
STOP FARXIGA   Follow-Up: At [facility], you and your health needs are our priority.  As part of our continuing mission to provide you with exceptional heart care, we have created designated Provider Care Teams.  These Care Teams include your primary Cardiologist (physician) and Advanced Practice Providers (APPs -  Physician Assistants and Nurse Practitioners) who all work together to provide you with the care you need, when you need it.  We recommend signing up for the patient portal called "MyChart".  Sign up information is provided on this After Visit Summary.  MyChart is used to connect with patients for Virtual Visits (Telemedicine).  Patients are able to view lab/test results, encounter notes, upcoming appointments, etc.  Non-urgent messages can be sent to your provider as well.   To learn more about what you can do with MyChart, go to [email or web address].    Your next appointment:   6 month(s)  Provider:   Kirk Ruths, MD

## 2022-05-18 LAB — LIPID PANEL
Chol/HDL Ratio: 3.2 ratio (ref 0.0–5.0)
Cholesterol, Total: 128 mg/dL (ref 100–199)
HDL: 40 mg/dL (ref >39)
LDL Chol Calc (NIH): 70 mg/dL (ref 0–99)
Triglycerides: 98 mg/dL (ref 0–149)
VLDL Cholesterol Cal: 18 mg/dL (ref 5–40)

## 2022-05-18 LAB — COMPREHENSIVE METABOLIC PANEL
ALT: 16 IU/L (ref 0–44)
AST: 19 IU/L (ref 0–40)
Albumin/Globulin Ratio: 1 — ABNORMAL LOW (ref 1.2–2.2)
Albumin: 3.4 g/dL — ABNORMAL LOW (ref 3.9–4.9)
Alkaline Phosphatase: 141 IU/L — ABNORMAL HIGH (ref 44–121)
BUN/Creatinine Ratio: 27 — ABNORMAL HIGH (ref 10–24)
BUN: 12 mg/dL (ref 8–27)
Bilirubin Total: 0.7 mg/dL (ref 0.0–1.2)
CO2: 17 mmol/L — ABNORMAL LOW (ref 20–29)
Calcium: 9.2 mg/dL (ref 8.6–10.2)
Chloride: 104 mmol/L (ref 96–106)
Creatinine, Ser: 0.45 mg/dL — ABNORMAL LOW (ref 0.76–1.27)
Globulin, Total: 3.4 g/dL (ref 1.5–4.5)
Glucose: 85 mg/dL (ref 70–99)
Potassium: 4.7 mmol/L (ref 3.5–5.2)
Sodium: 140 mmol/L (ref 134–144)
Total Protein: 6.8 g/dL (ref 6.0–8.5)
eGFR: 115 mL/min/{1.73_m2} (ref >59)

## 2022-06-15 ENCOUNTER — Other Ambulatory Visit: Payer: Self-pay | Admitting: Cardiology

## 2022-06-15 DIAGNOSIS — I2510 Atherosclerotic heart disease of native coronary artery without angina pectoris: Secondary | ICD-10-CM

## 2022-07-14 ENCOUNTER — Other Ambulatory Visit: Payer: Self-pay | Admitting: Cardiology

## 2022-10-15 ENCOUNTER — Other Ambulatory Visit: Payer: Self-pay | Admitting: Cardiology

## 2022-11-03 NOTE — Progress Notes (Signed)
HPI:FU coronary artery disease. He had an acute anterior infarct in 2003. At that time, he had PCI of his LAD and diagonal. Note, the Lcx and RCA had no disease. Carotid Dopplers in June of 2009 showed normal carotids. Patient had ICD placed in June of 2013. Nuclear study December 2018 showed ejection fraction 25%.  There was prior infarct but no ischemia. Echocardiogram July 2022 showed ejection fraction 25 to 30%, anteroseptal, inferior and apical akinesis, mild left ventricular hypertrophy, grade 1 diastolic dysfunction.  Also now with progressive ALS. Since last seen, he denies dyspnea, chest pain, palpitations or syncope.  He does have bilateral lower extremity edema.  Current Outpatient Medications  Medication Sig Dispense Refill   aspirin EC 81 MG tablet Take 1 tablet (81 mg total) by mouth daily. 90 tablet 3   atorvastatin (LIPITOR) 80 MG tablet TAKE ONE TAB BY MOUTH DAILY 90 tablet 3   carvedilol (COREG) 6.25 MG tablet Take 6.25 mg by mouth 2 (two) times daily with a meal. Take 6.25 mg ( 1/2 tablet of 12.5 mg ) twice a day     furosemide (LASIX) 20 MG tablet TAKE 1 TABLET BY MOUTH EVERY DAY AS NEEDED 90 tablet 1   LORazepam (ATIVAN) 1 MG tablet Take 1 mg by mouth 2 (two) times daily.     meclizine (ANTIVERT) 25 MG tablet Take by mouth.     riluzole (RILUTEK) 50 MG tablet Take 50 mg by mouth 2 (two) times daily.     sacubitril-valsartan (ENTRESTO) 24-26 MG Take 1 tablet by mouth 2 (two) times daily. 180 tablet 3   spironolactone (ALDACTONE) 25 MG tablet Take 1 tablet (25 mg total) by mouth once for 1 dose. 45 tablet 0   No current facility-administered medications for this visit.     Past Medical History:  Diagnosis Date   AICD (automatic cardioverter/defibrillator) present    ALS (amyotrophic lateral sclerosis) ([facility]) 09/2017   Anginal pain (HCC)    NO RECENT ANGINA   Bladder cancer (HCC)    CAD    CARDIOMYOPATHY, ISCHEMIC    CHF    Chronic kidney disease     HYPERLIPIDEMIA    HYPERTENSION    ICD (implantable cardiac defibrillator) in place 09/16/2011   Left ventricular dysfunction    Myocardial infarction [facility]) 2003    Past Surgical History:  Procedure Laterality Date   CARDIAC CATHETERIZATION     CYSTOSCOPY W/ URETERAL STENT PLACEMENT Left 10/03/2013   Procedure: CYSTOSCOPY WITH RETROGRADE PYELOGRAM/URETERAL STENT PLACEMENT;  Surgeon: Magdalene Molly, MD;  [facility];  Service: Urology;  Laterality: Left;   CYSTOSCOPY W/ URETERAL STENT PLACEMENT Left 03/15/2014   Procedure: CYSTOSCOPY WITH RETROGRADE PYELOGRAM/URETERAL STENT PLACEMENT;  Surgeon: Sebastian Ache, MD;  [facility];  Service: Urology;  Laterality: Left;   CYSTOSCOPY WITH RETROGRADE PYELOGRAM, URETEROSCOPY AND STENT PLACEMENT Bilateral 11/14/2013   Procedure: CYSTOSCOPY WITH BILATERAL RETROGRADE PYELOGRAM, LEFT URETEROSCOPY WITH BIOPSY  AND STENT EXCHANGE;  Surgeon: Sebastian Ache, MD;  [facility];  Service: Urology;  Laterality: Bilateral;   HERNIA REPAIR  2010   right inguinal   ICD  09/16/2011   IMPLANTABLE CARDIOVERTER DEFIBRILLATOR IMPLANT N/A 09/16/2011   Procedure: IMPLANTABLE CARDIOVERTER DEFIBRILLATOR IMPLANT;  Surgeon: Marinus Maw, MD;  Location: [facility] CATH LAB;  Service: Cardiovascular;  Laterality: N/A;   kidney removed  03/17/2014   pt had ear surgery     ROBOT ASSITED LAPAROSCOPIC NEPHROURETERECTOMY Left 03/15/2014   Procedure: ROBOT ASSITED LAPAROSCOPIC  NEPHROURETERECTOMY;  Surgeon: Sebastian Ache, MD;  [facility];  Service: Urology;  Laterality: Left;   TRANSURETHRAL RESECTION OF BLADDER TUMOR WITH GYRUS (TURBT-GYRUS) N/A 10/03/2013   Procedure: TRANSURETHRAL RESECTION OF BLADDER TUMOR WITH GYRUS (TURBT-GYRUS);  Surgeon: Magdalene Molly, MD;  [facility];  Service: Urology;  Laterality: N/A;   TRANSURETHRAL RESECTION OF BLADDER TUMOR WITH GYRUS (TURBT-GYRUS) N/A 11/14/2013   Procedure: TRANSURETHRAL RESECTION OF BLADDER TUMOR WITH GYRUS  (TURBT-GYRUS);  Surgeon: Sebastian Ache, MD;  [facility];  Service: Urology;  Laterality: N/A;    Social History   Socioeconomic History   Marital status: Married    Spouse name: Not on file   Number of children: Not on file   Years of education: Not on file   Highest education level: Not on file  Occupational History   Not on file  Tobacco Use   Smoking status: Former    Current packs/day: 0.00    Average packs/day: 0.5 packs/day for 6.0 years (3.0 ttl pk-yrs)    Types: Cigarettes    Start date: 09/15/1965    Quit date: 09/16/1971    Years since quitting: 51.1   Smokeless tobacco: Never  Vaping Use   Vaping status: Never Used  Substance and Sexual Activity   Alcohol use: No   Drug use: No   Sexual activity: Yes  Other Topics Concern   Not on file  Social History Narrative   Not on file   Social Determinants of Health   Financial Resource Strain: Low Risk  (08/02/2022)   Received from [facility]   Overall Financial Resource Strain (CARDIA)    Difficulty of Paying Living Expenses: Not hard at all  Food Insecurity: No Food Insecurity (08/02/2022)   Received from [facility]   Hunger Vital Sign    Worried About Running Out of Food in the Last Year: Never true    Ran Out of Food in the Last Year: Never true  Transportation Needs: No Transportation Needs (08/02/2022)   Received from [facility] - Transportation    Lack of Transportation (Medical): No    Lack of Transportation (Non-Medical): No  Physical Activity: Unknown (08/02/2022)   Received from [facility]   Exercise Vital Sign    Days of Exercise per Week: 0 days    Minutes of Exercise per Session: Not on file  Stress: No Stress Concern Present (08/02/2022)   Received from [facility] - Occupational Stress Questionnaire    Feeling of Stress : Only a little  Social Connections: Socially Integrated (08/02/2022)   Received from [facility]   Social  Network    How would you rate your social network (family, work, friends)?: Good participation with social networks  Intimate Partner Violence: Not At Risk (08/02/2022)   Received from [facility]   HITS    Over the last 12 months how often did your partner physically hurt you?: 1    Over the last 12 months how often did your partner insult you or talk down to you?: 1    Over the last 12 months how often did your partner threaten you with physical harm?: 1    Over the last 12 months how often did your partner scream or curse at you?: 1    Family History  Problem Relation Age of Onset   Cancer Father        prostate   Heart Problems Maternal Uncle  Cancer Paternal Uncle        lung   Cancer Paternal Grandmother        lung   Cancer Brother        lung   Heart failure Unknown     ROS: Progressive weakness from ALS but no fevers or chills, productive cough, hemoptysis, dysphasia, odynophagia, melena, hematochezia, dysuria, hematuria, rash, seizure activity, orthopnea, PND, claudication. Remaining systems are negative.  Physical Exam: Well-developed well-nourished in no acute distress.  Skin is warm and dry.  HEENT is normal.  Neck is supple.  Chest is clear to auscultation with normal expansion.  Cardiovascular exam is regular rate and rhythm.  Abdominal exam nontender or distended. No masses palpated. Extremities show no edema. neuro weakness in all 4 extremities.  EKG Interpretation Date/Time:  Monday November 08 2022 16:00:27 EDT Ventricular Rate:  76 PR Interval:  156 QRS Duration:  118 QT Interval:  374 QTC Calculation: 420 R Axis:   35  Text Interpretation: Normal sinus rhythm Incomplete right bundle branch block Septal infarct (cited on or before 28-May-2001) ST & T wave abnormality, consider lateral ischemia When compared with ECG of 25-Sep-2013 10:11, Incomplete right bundle branch block is now Present Confirmed by Olga Millers ([id number]) on 11/08/2022 4:33:14 PM      A/P  1 coronary artery disease-patient denies chest pain.  Continue aspirin and statin.  2 ischemic cardiomyopathy-continue Entresto and carvedilol.  3 chronic systolic congestive heart failure-will continue spironolactone at present dose.  Mildly volume overloaded on examination.  Increase Lasix to 40 mg daily.  Check potassium and renal function in 1 week.  Marcelline Deist discontinued previously as he has ALS and cannot control his urine/wears diapers.  I am concerned about the potential risk of infection outweighing the benefit.    4 prior ICD-his generator will not be changed in the future given his diagnosis of ALS.  5 hypertension-patient's blood pressure is controlled today.  Continue present medications.  6 hyperlipidemia-continue statin.  7 ALS-Per neurology.  Olga Millers, MD

## 2022-11-08 ENCOUNTER — Encounter: Payer: Self-pay | Admitting: Cardiology

## 2022-11-08 ENCOUNTER — Ambulatory Visit (INDEPENDENT_AMBULATORY_CARE_PROVIDER_SITE_OTHER): Payer: Medicare Other | Admitting: Cardiology

## 2022-11-08 VITALS — BP 96/70 | HR 76 | Ht 68.0 in | Wt 174.0 lb

## 2022-11-08 DIAGNOSIS — I10 Essential (primary) hypertension: Secondary | ICD-10-CM | POA: Diagnosis not present

## 2022-11-08 DIAGNOSIS — I5022 Chronic systolic (congestive) heart failure: Secondary | ICD-10-CM | POA: Diagnosis not present

## 2022-11-08 MED ORDER — FUROSEMIDE 20 MG PO TABS: 40.0000 mg | ORAL_TABLET | Freq: Every day | ORAL | 3 refills | Status: DC

## 2022-11-08 NOTE — Patient Instructions (Signed)
Medication Instructions:   INCREASE FUROSEMIDE TO 40 MG ONCE DAILY= 2 OF THE 20 MG TABLETS ONCE DAILY  *If you need a refill on your cardiac medications before your next appointment, please call your pharmacy*   Lab Work: Your physician recommends that you return for lab work in: ONE WEEK-DO NOT NEED TO FAST  If you have labs (blood work) drawn today and your tests are completely normal, you will receive your results only by: MyChart Message (if you have MyChart) OR A paper copy in the mail If you have any lab test that is abnormal or we need to change your treatment, we will call you to review the results.   Follow-Up: At [facility], you and your health needs are our priority.  As part of our continuing mission to provide you with exceptional heart care, we have created designated Provider Care Teams.  These Care Teams include your primary Cardiologist (physician) and Advanced Practice Providers (APPs -  Physician Assistants and Nurse Practitioners) who all work together to provide you with the care you need, when you need it.  We recommend signing up for the patient portal called "MyChart".  Sign up information is provided on this After Visit Summary.  MyChart is used to connect with patients for Virtual Visits (Telemedicine).  Patients are able to view lab/test results, encounter notes, upcoming appointments, etc.  Non-urgent messages can be sent to your provider as well.   To learn more about what you can do with MyChart, go to [email or web address].    Your next appointment:   6 month(s)  Provider:   Olga Millers, MD

## 2022-11-14 ENCOUNTER — Other Ambulatory Visit: Payer: Self-pay | Admitting: Cardiology

## 2022-11-18 LAB — BASIC METABOLIC PANEL
Glucose: 83 mg/dL (ref 70–99)
Sodium: 136 mmol/L (ref 134–144)

## 2022-11-21 ENCOUNTER — Other Ambulatory Visit: Payer: Self-pay | Admitting: Cardiology

## 2022-12-16 ENCOUNTER — Emergency Department (HOSPITAL_COMMUNITY): Payer: Medicare Other

## 2022-12-16 ENCOUNTER — Other Ambulatory Visit: Payer: Self-pay

## 2022-12-16 ENCOUNTER — Emergency Department (HOSPITAL_COMMUNITY)
Admission: EM | Admit: 2022-12-16 | Discharge: 2022-12-16 | Disposition: A | Discharge status: Home / Self Care | Payer: Medicare Other

## 2022-12-16 ENCOUNTER — Encounter (HOSPITAL_COMMUNITY): Payer: Self-pay

## 2022-12-16 ENCOUNTER — Telehealth: Payer: Self-pay | Admitting: Cardiology

## 2022-12-16 DIAGNOSIS — Z1152 Encounter for screening for COVID-19: Secondary | ICD-10-CM | POA: Insufficient documentation

## 2022-12-16 DIAGNOSIS — R42 Dizziness and giddiness: Secondary | ICD-10-CM | POA: Insufficient documentation

## 2022-12-16 DIAGNOSIS — R509 Fever, unspecified: Secondary | ICD-10-CM | POA: Diagnosis not present

## 2022-12-16 DIAGNOSIS — I959 Hypotension, unspecified: Secondary | ICD-10-CM | POA: Insufficient documentation

## 2022-12-16 DIAGNOSIS — I5020 Unspecified systolic (congestive) heart failure: Secondary | ICD-10-CM | POA: Diagnosis not present

## 2022-12-16 DIAGNOSIS — Z7982 Long term (current) use of aspirin: Secondary | ICD-10-CM | POA: Insufficient documentation

## 2022-12-16 LAB — CBC WITH DIFFERENTIAL/PLATELET
Abs Immature Granulocytes: 0.05 10*3/uL (ref 0.00–0.07)
Basophils Absolute: 0 10*3/uL (ref 0.0–0.1)
Basophils Relative: 1 %
Eosinophils Absolute: 0.2 10*3/uL (ref 0.0–0.5)
Eosinophils Relative: 3 %
HCT: 31.6 % — ABNORMAL LOW (ref 39.0–52.0)
Hemoglobin: 10.1 g/dL — ABNORMAL LOW (ref 13.0–17.0)
Immature Granulocytes: 1 %
Lymphocytes Relative: 15 %
Lymphs Abs: 1.2 10*3/uL (ref 0.7–4.0)
MCH: 30.6 pg (ref 26.0–34.0)
MCHC: 32 g/dL (ref 30.0–36.0)
MCV: 95.8 fL (ref 80.0–100.0)
Monocytes Absolute: 0.5 10*3/uL (ref 0.1–1.0)
Monocytes Relative: 6 %
Neutro Abs: 5.8 10*3/uL (ref 1.7–7.7)
Neutrophils Relative %: 74 %
Platelets: 247 10*3/uL (ref 150–400)
RBC: 3.3 MIL/uL — ABNORMAL LOW (ref 4.22–5.81)
RDW: 15.6 % — ABNORMAL HIGH (ref 11.5–15.5)
WBC: 7.7 10*3/uL (ref 4.0–10.5)
nRBC: 0 % (ref 0.0–0.2)

## 2022-12-16 LAB — URINALYSIS, W/ REFLEX TO CULTURE (INFECTION SUSPECTED)
Bacteria, UA: NONE SEEN
Bilirubin Urine: NEGATIVE
Glucose, UA: NEGATIVE mg/dL
Hgb urine dipstick: NEGATIVE
Ketones, ur: NEGATIVE mg/dL
Leukocytes,Ua: NEGATIVE
Nitrite: NEGATIVE
Protein, ur: NEGATIVE mg/dL
Specific Gravity, Urine: 1.019 (ref 1.005–1.030)
pH: 5 (ref 5.0–8.0)

## 2022-12-16 LAB — I-STAT CG4 LACTIC ACID, ED
Lactic Acid, Venous: 1.5 mmol/L (ref 0.5–1.9)
Lactic Acid, Venous: 1.7 mmol/L (ref 0.5–1.9)

## 2022-12-16 LAB — COMPREHENSIVE METABOLIC PANEL
ALT: 16 U/L (ref 0–44)
AST: 18 U/L (ref 15–41)
Albumin: 2.6 g/dL — ABNORMAL LOW (ref 3.5–5.0)
Alkaline Phosphatase: 104 U/L (ref 38–126)
Anion gap: 6 (ref 5–15)
BUN: 21 mg/dL (ref 8–23)
CO2: 20 mmol/L — ABNORMAL LOW (ref 22–32)
Calcium: 8.5 mg/dL — ABNORMAL LOW (ref 8.9–10.3)
Chloride: 109 mmol/L (ref 98–111)
Creatinine, Ser: 0.56 mg/dL — ABNORMAL LOW (ref 0.61–1.24)
GFR, Estimated: >60 mL/min (ref >60)
Glucose, Bld: 100 mg/dL — ABNORMAL HIGH (ref 70–99)
Potassium: 4.2 mmol/L (ref 3.5–5.1)
Sodium: 135 mmol/L (ref 135–145)
Total Bilirubin: 0.7 mg/dL (ref 0.3–1.2)
Total Protein: 6.7 g/dL (ref 6.5–8.1)

## 2022-12-16 LAB — RESP PANEL BY RT-PCR (RSV, FLU A&B, COVID)  RVPGX2
Influenza A by PCR: NEGATIVE
Influenza B by PCR: NEGATIVE
Resp Syncytial Virus by PCR: NEGATIVE
SARS Coronavirus 2 by RT PCR: NEGATIVE

## 2022-12-16 LAB — PROTIME-INR
INR: 1 (ref 0.8–1.2)
Prothrombin Time: 13.8 seconds (ref 11.4–15.2)

## 2022-12-16 LAB — APTT: aPTT: 27 seconds (ref 24–36)

## 2022-12-16 MED ORDER — VANCOMYCIN HCL 1500 MG/300ML IV SOLN
1500.0000 mg | Freq: Once | INTRAVENOUS | Status: AC
  Administered 2022-12-16: 1500 mg via INTRAVENOUS
  Filled 2022-12-16: qty 300

## 2022-12-16 MED ORDER — SODIUM CHLORIDE 0.9 % IV SOLN
2.0000 g | Freq: Once | INTRAVENOUS | Status: AC
  Administered 2022-12-16: 2 g via INTRAVENOUS
  Filled 2022-12-16: qty 12.5

## 2022-12-16 MED ORDER — METRONIDAZOLE 500 MG/100ML IV SOLN
500.0000 mg | Freq: Once | INTRAVENOUS | Status: AC
  Administered 2022-12-16: 500 mg via INTRAVENOUS
  Filled 2022-12-16: qty 100

## 2022-12-16 MED ORDER — LACTATED RINGERS IV SOLN: INTRAVENOUS | Status: DC

## 2022-12-16 MED ORDER — VANCOMYCIN HCL IN DEXTROSE 1-5 GM/200ML-% IV SOLN: 1000.0000 mg | Freq: Once | INTRAVENOUS | Status: DC

## 2022-12-16 NOTE — ED Notes (Signed)
IV team at bedside 

## 2022-12-16 NOTE — Sepsis Progress Note (Signed)
Notified bedside nurse of need to draw lactic acid.

## 2022-12-16 NOTE — Sepsis Progress Note (Signed)
Notified provider of need to order lactic acid and repeat lactic acid.

## 2022-12-16 NOTE — ED Provider Notes (Signed)
[place] EMERGENCY DEPARTMENT AT [facility] Provider Note   CSN: [id number] Arrival date & time: 12/16/22  0805     History  Chief Complaint  Patient presents with   Hypotension    YOAV SOCARRAS is a 67 y.o. male.  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  The patient was feeling very dizzy this morning.  He had his nephew check his blood pressure and it was in the 70s systolic.  They called medics at that time and has initial blood pressure with him was also in the 70s.  The patient was found to be febrile at 102.  The patient denies any known infectious symptoms at this time.  He does have ALS so he is unsure if he is having the urinary symptoms.  He states that he did have a bowel movement this morning when he was feeling very lightheaded but denies any abdominal pain that he is aware of.  He was brought to the emergency department at time for further evaluation.  He denies any cough or shortness of breath.  He did receive some IV fluids with medics.        Home Medications Prior to Admission medications   Medication Sig Start Date End Date Taking? Authorizing Provider  amitriptyline (ELAVIL) 50 MG tablet Take 1 tablet by mouth at bedtime. 12/03/22 12/03/23 Yes [provider]  aspirin EC 81 MG tablet Take 1 tablet (81 mg total) by mouth daily. 11/13/14  Yes [name], MD  atorvastatin (LIPITOR) 80 MG tablet TAKE ONE TAB BY MOUTH DAILY 06/17/22  Yes [name], MD  furosemide (LASIX) 20 MG tablet Take 2 tablets (40 mg total) by mouth daily. 11/08/22  Yes [name], MD  riluzole (RILUTEK) 50 MG tablet Take 50 mg by mouth 2 (two) times daily. 10/06/17  Yes [provider]  spironolactone (ALDACTONE) 25 MG tablet Take 1 tablet (25 mg total) by mouth once for 1 dose. Patient taking differently: Take 25 mg by mouth daily.  11/15/22 12/16/22 Yes [name], MD  valACYclovir (VALTREX) 1000 MG tablet Take 1,000 mg by mouth 3 (three) times daily.   Yes [provider]  losartan (COZAAR) 25 MG tablet Take 1 tablet (25 mg total) by mouth daily. 12/20/22 03/20/23  [name], MD  metoprolol succinate (TOPROL XL) 25 MG 24 hr tablet Take 0.5 tablets (12.5 mg total) by mouth daily. 12/20/22   [name], MD      Allergies    Plavix [clopidogrel]    Review of Systems   Review of Systems  Constitutional:  Positive for fatigue.  Neurological:  Positive for light-headedness.  All other systems reviewed and are negative.   Physical Exam Updated Vital Signs BP 110/70   Pulse 72   Temp 97.6 F (36.4 C) (Oral)   Resp 15   Ht 5\' 8"  (1.727 m)   Wt 80.7 kg   SpO2 100%   BMI 27.06 kg/m  Physical Exam Vitals and nursing note reviewed.   Gen: NAD Eyes: PERRL, EOMI HEENT: no oropharyngeal swelling Neck: trachea midline Resp: clear to auscultation bilaterally Card: RRR, no murmurs, rubs, or gallops Abd: nontender, nondistended Extremities: no calf tenderness, no edema Vascular: 2+ radial pulses bilaterally, 2+ DP pulses bilaterally Skin: no rashes Psyc: acting appropriately   ED Results / Procedures / Treatments  Labs (all labs ordered are listed, but only abnormal results are displayed) Labs Reviewed  COMPREHENSIVE METABOLIC PANEL - Abnormal; Notable for the following components:      Result Value   CO2 20 (*)    Glucose, Bld 100 (*)    Creatinine, Ser 0.56 (*)    Calcium 8.5 (*)    Albumin 2.6 (*)    All other components within normal limits  CBC WITH DIFFERENTIAL/PLATELET - Abnormal; Notable for the following components:   RBC 3.30 (*)    Hemoglobin 10.1 (*)    HCT 31.6 (*)    RDW 15.6 (*)    All other components within normal limits  CULTURE, BLOOD (SINGLE)  RESP PANEL BY RT-PCR (RSV, FLU A&B, COVID)  RVPGX2  CULTURE, BLOOD (ROUTINE X 2)  PROTIME-INR  APTT   URINALYSIS, W/ REFLEX TO CULTURE (INFECTION SUSPECTED)  I-STAT CG4 LACTIC ACID, ED  I-STAT CG4 LACTIC ACID, ED    EKG EKG Interpretation Date/Time:  Thursday December 16 2022 09:04:57 EDT Ventricular Rate:  61 PR Interval:  173 QRS Duration:  123 QT Interval:  417 QTC Calculation: 420 R Axis:   -19  Text Interpretation: Sinus rhythm Nonspecific intraventricular conduction delay Anteroseptal infarct, age indeterminate Lateral leads are also involved no sig change from previous Confirmed by Arby Barrette [phone]) on 12/16/2022 6:05:33 PM  Radiology No results found.  Procedures Procedures    Medications Ordered in ED Medications  ceFEPIme (MAXIPIME) 2 g in sodium chloride 0.9 % 100 mL IVPB (0 g Intravenous Stopped 12/16/22 1016)  metroNIDAZOLE (FLAGYL) IVPB 500 mg (0 mg Intravenous Stopped 12/16/22 1115)  vancomycin (VANCOREADY) IVPB 1500 mg/300 mL (0 mg Intravenous Stopped 12/16/22 1319)    ED Course/ Medical Decision Making/ A&P                                 Medical Decision Making 67 year old male with past medical history of ALS and CHF presents the emergency department today with concern for hypotension and fever at home.  The patient did receive fluids with medics.  His blood pressure is improved.  I will initiate a sepsis workup on the patient.  I will cover him empirically with broad-spectrum antibiotics.  Will also obtain a COVID and flu swab on the patient.  I will hold off on additional fluids at this time until the patient's lactic acid is resulted.  The patient does have an ejection fraction of 25 to 30% and with his blood pressure responding to initial fluid resuscitation I will hold off to avoid volume overload.  He will likely require admission.  The patient is ordered antibiotics initially and sepsis labs were ordered.  This brought to my attention that the lab orders did not go through so there was a delay and the orders being placed again.  The patient's  blood pressures returned to normal here without any intervention.  His lactic acid is unremarkable.  Initial chest x-ray is negative.  I did discuss this with the patient and family.  They do report that the patient has been weaning down on his carvedilol and his episode this morning happened 1 hour after taking this.  They think that this may be causing some of the symptoms.  Think this could potentially be a reason for his initial presentation.  We discussed admission versus going home with outpatient follow-up.  The patient would like to go home.  Since he has  been stable here I think this is reasonable.  Will follow-up on his urinalysis to determine whether he needs antibiotics or if he could simply go home and follow-up with his cardiologist for further titration down on the carvedilol.  Amount and/or Complexity of Data Reviewed Labs: ordered. Radiology: ordered. ECG/medicine tests: ordered.  Risk Prescription drug management.   UA pending at the time of signout         Final Clinical Impression(s) / ED Diagnoses Final diagnoses:  Hypotension, unspecified hypotension type  Dizzy    Rx / DC Orders ED Discharge Orders     None         Durwin Glaze, MD 12/21/22 Ernestina Columbia

## 2022-12-16 NOTE — ED Notes (Signed)
Staff assisted with transferring pt to motorized wheelchair

## 2022-12-16 NOTE — Discharge Instructions (Signed)
1.  Do not take your carvedilol tonight.  Monitor your heart rate and your blood pressure closely.  You may take a 6.25 dose of your carvedilol if your systolic blood pressure is greater than 120 or your heart rate is greater than 80.  You may have to make several adjustments to this medication to find the dose that is right for you.  Sometimes over time your dosing also changes. 2.  Return immediately if you develop a fever, specific area of pain, recurrent feeling of near passing out or other concerning changes. 3.  Call your cardiologist again in the morning to discuss your ongoing care and follow-up plan.

## 2022-12-16 NOTE — Sepsis Progress Note (Signed)
Elink monitoring for the code sepsis protocol.  

## 2022-12-16 NOTE — ED Triage Notes (Signed)
BIBM from home d/t hypotension. Seen at "[facility]" and was hypotensive there but was discharged home. Continues to be hypotensive and has a fever this AM. Given tylenol 1g PO, Rocephin 1g IM, NS bolus from EMS. Blood Sugar 116. Pressure and fever improved en route to ED. A+Ox4.

## 2022-12-16 NOTE — ED Notes (Signed)
RN could only get 1 set of cultures, MD notified

## 2022-12-16 NOTE — Telephone Encounter (Signed)
Spoke with patient and he gave verbal consent to talk to his daughter Archie Patten.  This morning after taking morning meds patient felt dizzy and was weak. BP dropped to 70/40. He is currently in the hospital but wanted to make you aware.

## 2022-12-16 NOTE — Progress Notes (Signed)
ED Pharmacy Antibiotic Sign Off An antibiotic consult was received from an ED provider for vancomycin and cefepime per pharmacy dosing for sepsis. A chart review was completed to assess appropriateness.   The following one time order(s) were placed:  Cefepime 2G IV x1 Vancomycin 1500mg  IV x1  Further antibiotic and/or antibiotic pharmacy consults should be ordered by the admitting provider if indicated.   Thank you for allowing pharmacy to be a part of this patient's care.   Smitty Cords, [facility]  Clinical Pharmacist 12/16/22 8:32 AM

## 2022-12-16 NOTE — ED Provider Notes (Addendum)
F/u UA. Sepsis w/u unremarkable. Wants to go home. D/C home with antibiotics:Keflex.  Physical Exam  BP 104/71   Pulse (!) 54   Temp 97.7 F (36.5 C) (Oral)   Resp 14   Ht 5\' 8"  (1.727 m)   Wt 80.7 kg   SpO2 98%   BMI 27.06 kg/m   Physical Exam  Procedures  Procedures  ED Course / MDM    Medical Decision Making Amount and/or Complexity of Data Reviewed Labs: ordered. Radiology: ordered. ECG/medicine tests: ordered.  Risk Prescription drug management.   Patient's urinalysis has returned normal.  Blood pressures have consistently stayed low 100 systolic 104/76 his last reading.  Heart rate is in the 70s.  There is an erroneous documentation of heart rate of 130.  This is not correct I have been personally in the room and is consistently in the 60s to 70.  Patient is alert and in no acute distress.  He has recently been making adjustments of the carvedilol due to hypotension and dizziness.  This time we reviewed a plan of holding carvedilol tonight and closely monitoring blood pressures and heart rate at home.  Family is in contact with cardiology regarding management of this.  I have put some specific parameters in for Coreg to start back at 6.25 with close cardiology follow-up.  Family and patient are in agreement with plan.       Arby Barrette, MD 12/16/22 Christena Deem    Arby Barrette, MD 12/16/22 [phone]

## 2022-12-16 NOTE — Telephone Encounter (Signed)
Pt c/o medication issue:  1. Name of Medication: carvedilol (COREG) 12.5 MG tablet   2. How are you currently taking this medication (dosage and times per day)? 6.25 2 x's daily   3. Are you having a reaction (difficulty breathing--STAT)?   4. What is your medication issue? Patient is currently admitted and daughter-in-law would like a call back to discuss how this medication should be taken. Believes this may be the cause for the dizziness patient was experiencing prior to being taken to hospital. She states she is not currently on his DPR, but has patient with her now, who can give permission to speak to someone.

## 2022-12-16 NOTE — ED Notes (Signed)
I asked the pt if he could give [place] a urine sample and he said that he does not feel like he has to urinate at the moment, pt urged to use call bell when he does feel like he needs to urinate.

## 2022-12-20 MED ORDER — LOSARTAN POTASSIUM 25 MG PO TABS: 25.0000 mg | ORAL_TABLET | Freq: Every day | ORAL | 3 refills | Status: DC

## 2022-12-20 MED ORDER — METOPROLOL SUCCINATE ER 25 MG PO TB24: 12.5000 mg | ORAL_TABLET | Freq: Every day | ORAL | 3 refills | Status: DC

## 2022-12-20 NOTE — Telephone Encounter (Signed)
Call to daughter regarding provider recommendations:  Decrease coreg to 6.25 mg BID and follow BP [name] [name]   She states he has been taking this dose but unsure how long  She states he took this dose yesterday morning and an hour and half later, his BP 77/59  HR 103. Asymptomatic.  Call to patient and he states that his family doctor had reduced his carvedilol to 6.25 BID.He states this has been his dose 6-8 months. He states he also ended up In the ED and was told to hold if Systolic less than 115.  BP this morning 95/78 so he did not take the medication. Asymptomatic.  He only had dizziness Thursday morning . That is when he went to ED.   Please advise regarding if he needs to continue to hold with parameters.

## 2022-12-20 NOTE — Telephone Encounter (Signed)
Patient is still having issues with his bp. The top number is 95, calling to see what he should do. Please advise.

## 2022-12-20 NOTE — Telephone Encounter (Signed)
Discontinue carvedilol, discontinue Entresto, treat with losartan 25 mg daily and Toprol 12.5 mg daily and follow blood pressure. Olga Millers

## 2022-12-20 NOTE — Addendum Note (Signed)
Addended by: Candie Chroman on: 12/20/2022 10:44 AM   Modules accepted: Orders

## 2022-12-20 NOTE — Telephone Encounter (Signed)
Call to patient and discussed the changes in medication per the provider.  He states understanding and will call if BP continues to run low below 90 systolic. New prescriptions sent ina nd other discontinued per message from provider   Discontinue carvedilol, discontinue Entresto, treat with losartan 25 mg daily and Toprol 12.5 mg daily and follow blood pressure. [name] [name]

## 2022-12-21 LAB — CULTURE, BLOOD (SINGLE)
Culture: NO GROWTH
Special Requests: ADEQUATE

## 2022-12-21 LAB — CULTURE, BLOOD (ROUTINE X 2): Culture: NO GROWTH

## 2023-04-05 ENCOUNTER — Other Ambulatory Visit: Payer: Self-pay | Admitting: Cardiology

## 2023-04-05 DIAGNOSIS — I2510 Atherosclerotic heart disease of native coronary artery without angina pectoris: Secondary | ICD-10-CM

## 2023-09-14 ENCOUNTER — Other Ambulatory Visit: Payer: Self-pay | Admitting: Cardiology

## 2023-09-14 DIAGNOSIS — I2510 Atherosclerotic heart disease of native coronary artery without angina pectoris: Secondary | ICD-10-CM

## 2023-11-04 ENCOUNTER — Other Ambulatory Visit (HOSPITAL_COMMUNITY): Payer: Self-pay

## 2023-11-04 ENCOUNTER — Telehealth: Payer: Self-pay | Admitting: Cardiology

## 2023-11-04 MED ORDER — SPIRONOLACTONE 25 MG PO TABS
25.0000 mg | ORAL_TABLET | Freq: Every day | ORAL | 0 refills | Status: AC
  Filled 2023-11-04: qty 90, 90d supply, fill #0

## 2023-11-04 NOTE — Telephone Encounter (Signed)
*  STAT* If patient is at the pharmacy, call can be transferred to refill team.   1. Which medications need to be refilled? (please list name of each medication and dose if known) spironolactone  (ALDACTONE ) 25 MG tablet (Expired)    2. Would you like to learn more about the convenience, safety, & potential cost savings by using the [facility]?    3. Are you open to using the [facility] (Type [facility]. ).   4. Which pharmacy/location (including street and city if local pharmacy) is medication to be sent to? [id number] [address], [place] - [address]    5. Do they need a 30 day or 90 day supply? 90 day

## 2023-11-15 ENCOUNTER — Other Ambulatory Visit (HOSPITAL_COMMUNITY): Payer: Self-pay

## 2023-12-10 ENCOUNTER — Other Ambulatory Visit: Payer: Self-pay | Admitting: Cardiology

## 2023-12-14 ENCOUNTER — Other Ambulatory Visit: Payer: Self-pay | Admitting: Cardiology

## 2023-12-14 DIAGNOSIS — I5022 Chronic systolic (congestive) heart failure: Secondary | ICD-10-CM

## 2024-01-06 ENCOUNTER — Other Ambulatory Visit: Payer: Self-pay | Admitting: Cardiology

## 2024-01-06 DIAGNOSIS — I5022 Chronic systolic (congestive) heart failure: Secondary | ICD-10-CM

## 2024-01-10 ENCOUNTER — Other Ambulatory Visit: Payer: Self-pay | Admitting: Cardiology

## 2024-02-03 ENCOUNTER — Other Ambulatory Visit: Payer: Self-pay | Admitting: Cardiology

## 2024-02-07 ENCOUNTER — Other Ambulatory Visit: Payer: Self-pay | Admitting: Cardiology

## 2024-02-16 ENCOUNTER — Other Ambulatory Visit: Payer: Self-pay | Admitting: Cardiology

## 2024-02-28 ENCOUNTER — Other Ambulatory Visit: Payer: Self-pay | Admitting: Cardiology

## 2024-02-28 DIAGNOSIS — I5022 Chronic systolic (congestive) heart failure: Secondary | ICD-10-CM

## 2024-03-02 ENCOUNTER — Telehealth: Payer: Self-pay | Admitting: Cardiology

## 2024-03-02 NOTE — Telephone Encounter (Signed)
*  STAT* If patient is at the pharmacy, call can be transferred to refill team.   1. Which medications need to be refilled? (please list name of each medication and dose if known) metoprolol  succinate (TOPROL -XL) 25 MG 24 hr tablet  TAKE 1/2 TABLET BY MOUTH DAILY   2. Would you like to learn more about the convenience, safety, & potential cost savings by using the [facility]? No     3. Are you open to using the [facility] No   4. Which pharmacy/location (including street and city if local pharmacy) is medication to be sent to?[id number] [address], [place] - [address]    5. Do they need a 30 day or 90 day supply? 90 Day Supply   Pt is currently is out of medication

## 2024-03-05 MED ORDER — METOPROLOL SUCCINATE ER 25 MG PO TB24: 12.5000 mg | ORAL_TABLET | Freq: Every day | ORAL | 0 refills | Status: DC

## 2024-03-05 NOTE — Telephone Encounter (Signed)
 Pt scheduled to see Katlyn West, NP, 03/15/24, #15 tablets sent in.

## 2024-03-11 NOTE — Progress Notes (Unsigned)
 Cardiology Office Note    Date:  03/15/2024  ID:  [name] [name], DOB 1956-03-03, MRN [id number] PCP:  Bobbette Coye LABOR, MD  Cardiologist:  Redell Shallow, MD  Electrophysiologist:  None   Chief Complaint: Follow up for HFrEF   History of Present Illness: .   [name] [name] is a 68 y.o. male with visit-pertinent history of CAD s/p anterior infarct in 2003, ICM s/p ICD in June 2013, HFrEF, ALS.  Patient with history of anterior infarct in 2003, s/p PCI of his LAD and diagonal, LCx and RCA had no disease.  Carotid Dopplers in June 2009 showed normal carotids.  Nuclear study in December 2018 showed EF 25%, there was prior infarct but no evidence of ischemia.  Echo in July 2022 showed EF 25 to 30%, anteroseptal, inferior and apical akinesis, mild LVH, G1 DD.  Patient was last seen in clinic by Dr. Shallow on 11/08/2022.  He had remained stable for cardiac standpoint, noted to have bilateral lower extremity edema.  Patient's Lasix  was increased to 40 mg daily.  Today he presents for follow-up.  He reports that he has been doing well overall.  He denies any chest pain, shortness of breath, palpitations, presyncope or syncope.  He denies any orthopnea or PND.  He does note some stable bilateral swelling to his feet, denies any above the ankle, reports this is stable and overall unchanged.  He notes this only changes if he lays in bed flat for prolonged periods of time as he is otherwise unable to elevate his feet.  He is closely followed by neurology at [facility].  Labwork independently reviewed: 11/22/2023: Hemoglobin 10.8, hematocrit 34.4, creatinine 0.47, sodium 136, potassium 3.8, AST 24, LT 15 ROS: .   Today he denies chest pain, shortness of breath, fatigue, palpitations, melena, hematuria, hemoptysis, diaphoresis, weakness, presyncope, syncope, orthopnea, and PND.  All other systems are reviewed and otherwise negative. Studies Reviewed: SABRA   EKG:  EKG is ordered today, personally reviewed,  demonstrating  EKG Interpretation Date/Time:  Thursday March 15 2024 13:43:15 EST Ventricular Rate:  70 PR Interval:  164 QRS Duration:  124 QT Interval:  424 QTC Calculation: 457 R Axis:   44  Text Interpretation: Normal sinus rhythm  Intraventricular conduction delay Septal infarct , age undetermined ST & T wave abnormality anterolateral leads  No significant change since last tracing  Confirmed by Jock Mahon [phone]) on 03/15/2024 4:04:05 PM   CV Studies: Cardiac studies reviewed are outlined and summarized above. Otherwise please see EMR for full report. Cardiac Studies & Procedures   ______________________________________________________________________________________________   STRESS TESTS  MYOCARDIAL PERFUSION IMAGING 03/11/2017  Interpretation Summary  Nuclear stress EF: 25%. General hypokinesis with septal apical dyskinesis  The left ventricular ejection fraction is severely decreased (<30%).  There was no ST segment deviation noted during stress.  Defect 1: There is a large defect of severe severity present in the basal anteroseptal, mid anteroseptal, apical anterior, apical septal, apical inferior, apical lateral and apex location.  Findings consistent with prior myocardial infarction.  This is a high risk study based upon large infarct and reduced ejection fraction. There is no ischemia identified.  [name] Parchment, MD   ECHOCARDIOGRAM  ECHOCARDIOGRAM COMPLETE 10/23/2020  Narrative ECHOCARDIOGRAM REPORT    Patient Name:   [name] [name] Date of Exam: 10/23/2020 Medical Rec #:  [id number]      Height:       68.0 in Accession #:    [id number]  Weight:       174.0 lb Date of Birth:  May 16, 1955       BSA:          1.926 m Patient Age:    22 years       BP:           103/73 mmHg Patient Gender: M              HR:           73 bpm. Exam Location:  [address]  Procedure: 2D Echo, Cardiac Doppler, Color Doppler and Intracardiac Opacification  Agent  Indications:    I25.5 Ischemic cardiomyopathy  History:        Patient has prior history of Echocardiogram examinations, most recent 08/17/2011. CHF, Defibrillator; Risk Factors:Hypertension. Coronary atherosclerosis.  Sonographer:    Jon Hacker RCS Referring Phys: [phone] BRIAN S CRENSHAW  IMPRESSIONS   1. Left ventricular ejection fraction, by estimation, is 25 to 30%. The left ventricle has severely decreased function. The left ventricle demonstrates regional wall motion abnormalities with anteroseptal and inferoseptal akinesis, apical akinesis, inferior akinesis. There is mild left ventricular hypertrophy. Left ventricular diastolic parameters are consistent with Grade I diastolic dysfunction (impaired relaxation). 2. Right ventricular systolic function is normal. The right ventricular size is normal. Tricuspid regurgitation signal is inadequate for assessing PA pressure. 3. The mitral valve is normal in structure. No evidence of mitral valve regurgitation. No evidence of mitral stenosis. 4. The aortic valve is tricuspid. Aortic valve regurgitation is not visualized. No aortic stenosis is present. 5. The inferior vena cava is normal in size with greater than 50% respiratory variability, suggesting right atrial pressure of 3 mmHg.  FINDINGS Left Ventricle: Left ventricular ejection fraction, by estimation, is 25 to 30%. The left ventricle has severely decreased function. The left ventricle demonstrates regional wall motion abnormalities. The left ventricular internal cavity size was normal in size. There is mild left ventricular hypertrophy. Left ventricular diastolic parameters are consistent with Grade I diastolic dysfunction (impaired relaxation).  Right Ventricle: The right ventricular size is normal. No increase in right ventricular wall thickness. Right ventricular systolic function is normal. Tricuspid regurgitation signal is inadequate for assessing PA pressure.  Left  [facility]: Left atrial size was normal in size.  Right [facility]: Right atrial size was normal in size.  Pericardium: There is no evidence of pericardial effusion.  Mitral Valve: The mitral valve is normal in structure. No evidence of mitral valve regurgitation. No evidence of mitral valve stenosis.  Tricuspid Valve: The tricuspid valve is normal in structure. Tricuspid valve regurgitation is trivial.  Aortic Valve: The aortic valve is tricuspid. Aortic valve regurgitation is not visualized. No aortic stenosis is present.  Pulmonic Valve: The pulmonic valve was normal in structure. Pulmonic valve regurgitation is trivial.  Aorta: The aortic root is normal in size and structure.  Venous: The inferior vena cava is normal in size with greater than 50% respiratory variability, suggesting right atrial pressure of 3 mmHg.  IAS/Shunts: No atrial level shunt detected by color flow Doppler.  Additional Comments: A device lead is visualized in the right ventricle.   LEFT VENTRICLE PLAX 2D LVIDd:         3.50 cm  Diastology LVIDs:         2.90 cm  LV e' medial:    3.78 cm/s LV PW:         1.30 cm  LV E/e' medial:  10.2 LV IVS:  1.20 cm  LV e' lateral:   7.28 cm/s LVOT diam:     2.05 cm  LV E/e' lateral: 5.3 LV SV:         56 LV SV Index:   29 LVOT Area:     3.30 cm   RIGHT VENTRICLE RV Basal diam:  2.50 cm RV S prime:     9.41 cm/s  LEFT ATRIUM             Index       RIGHT ATRIUM           Index LA diam:        2.30 cm 1.19 cm/m  RA Area:     10.10 cm LA Vol (A2C):   26.4 ml 13.71 ml/m RA Volume:   20.90 ml  10.85 ml/m LA Vol (A4C):   19.8 ml 10.28 ml/m LA Biplane Vol: 23.4 ml 12.15 ml/m AORTIC VALVE LVOT Vmax:   112.00 cm/s LVOT Vmean:  67.200 cm/s LVOT VTI:    0.169 m  AORTA Ao Root diam: 3.20 cm  MITRAL VALVE MV Area (PHT): 3.27 cm    SHUNTS MV Decel Time: 232 msec    Systemic VTI:  0.17 m MV E velocity: 38.40 cm/s  Systemic Diam: 2.05 cm MV A velocity: 62.90  cm/s MV E/A ratio:  0.61  [name] Shuck MD Electronically signed by [name] Shuck MD Signature Date/Time: 10/23/2020/5:29:33 PM    Final          ______________________________________________________________________________________________       Current Reported Medications:.    Active Medications[1]  Physical Exam:    VS:  BP 108/72 (BP Location: Left Arm, Patient Position: Sitting, Cuff Size: Normal)   Pulse (!) 54   Resp 14   Ht 5' 8 (1.727 m)   Wt 174 lb (78.9 kg)   SpO2 98%   BMI 26.46 kg/m    Wt Readings from Last 3 Encounters:  03/15/24 174 lb (78.9 kg)  12/16/22 178 lb (80.7 kg)  11/08/22 174 lb (78.9 kg)    GEN: Well nourished, well developed in no acute distress NECK: No JVD; No carotid bruits CARDIAC: RRR, no murmurs, rubs, gallops RESPIRATORY:  Clear to auscultation without rales, wheezing or rhonchi  ABDOMEN: Soft, non-tender, non-distended EXTREMITIES: +2 edema to bilateral feet, no edema above the ankles; No acute deformity     Asessement and Plan:.    CAD: Patient with history of anterior infarction 2003 s/p PCI of LAD and diagonal, LCx and RCA with no disease at that time.  Nuclear study in December 2018 showed prior infarct but no evidence of ischemia. Stable with no anginal symptoms. No indication for ischemic evaluation.  Reviewed ED precautions.  Continue aspirin  81 mg daily, Lipitor 80 mg daily, Lasix  40 mg daily, losartan  25 mg daily, metoprolol  succinate 12.5 mg daily and spironolactone  25 mg daily.  Ischemic cardiomyopathy: Last echocardiogram indicated LVEF 25 to 30%, mild LVH, G1 DD, RV systolic function size was normal, no evidence of mitral regurgitation, no evidence of stenosis, aortic valve regurgitation not visualized, no stenosis present. S/p ICD in 2013, now reached ERI.  Per notes no plans for generator change out given diagnosis of ALS.   Today he reports that he is doing well overall, denies any increased shortness of breath,  orthopnea or PND.  He reports stable swelling of the bilateral feet, notes no significant changes unless he lays in bed for prolonged period of time as he is otherwise unable  to elevate his feet.  Will continue Lasix  40 mg daily.  Continue losartan  25 mg daily, metoprolol  succinate 12.5 mg daily and spironolactone  25 mg daily.  Hypertension: Blood pressure today 108/72. Continue current antihypertensive regimen.  Hyperlipidemia: Last lipid profile on 11/22/2023 indicated total cholesterol 125, triglycerides 90, HDL 43 and LDL 65.  Continue Lipitor 80 mg daily.  ALS: Followed by neurology at [facility].   Disposition: F/u with Dr. Pietro in six months or sooner if needed.   Signed, [name] Peitz D Hang Ammon, NP       [1]  Current Meds  Medication Sig   amitriptyline (ELAVIL) 50 MG tablet Take 1 tablet by mouth at bedtime.   aspirin  EC 81 MG tablet Take 1 tablet (81 mg total) by mouth daily.   atorvastatin  (LIPITOR) 80 MG tablet TAKE 1 TABLET BY MOUTH EVERY DAY   furosemide  (LASIX ) 20 MG tablet TAKE 2 TABLETS (40 MG TOTAL) BY MOUTH DAILY.   losartan  (COZAAR ) 25 MG tablet TAKE 1 TABLET (25 MG TOTAL) BY MOUTH DAILY.   riluzole (RILUTEK) 50 MG tablet Take 50 mg by mouth 2 (two) times daily.   spironolactone  (ALDACTONE ) 25 MG tablet Take 1 tablet (25 mg total) by mouth daily.   [DISCONTINUED] metoprolol  succinate (TOPROL -XL) 25 MG 24 hr tablet Take 0.5 tablets (12.5 mg total) by mouth daily.

## 2024-03-15 ENCOUNTER — Encounter: Payer: Self-pay | Admitting: Cardiology

## 2024-03-15 ENCOUNTER — Ambulatory Visit: Attending: Cardiovascular Disease | Admitting: Cardiology

## 2024-03-15 VITALS — BP 108/72 | HR 54 | Resp 14 | Ht 68.0 in | Wt 174.0 lb

## 2024-03-15 DIAGNOSIS — I5022 Chronic systolic (congestive) heart failure: Secondary | ICD-10-CM | POA: Insufficient documentation

## 2024-03-15 DIAGNOSIS — I2510 Atherosclerotic heart disease of native coronary artery without angina pectoris: Secondary | ICD-10-CM | POA: Diagnosis present

## 2024-03-15 DIAGNOSIS — I10 Essential (primary) hypertension: Secondary | ICD-10-CM | POA: Diagnosis present

## 2024-03-15 DIAGNOSIS — E7800 Pure hypercholesterolemia, unspecified: Secondary | ICD-10-CM | POA: Diagnosis present

## 2024-03-15 DIAGNOSIS — I255 Ischemic cardiomyopathy: Secondary | ICD-10-CM | POA: Diagnosis present

## 2024-03-15 MED ORDER — METOPROLOL SUCCINATE ER 25 MG PO TB24: 12.5000 mg | ORAL_TABLET | Freq: Every day | ORAL | 3 refills | Status: AC

## 2024-03-15 NOTE — Patient Instructions (Signed)
 Medication Instructions:   Your physician recommends that you continue on your current medications as directed. Please refer to the Current Medication list given to you today.  *If you need a refill on your cardiac medications before your next appointment, please call your pharmacy*    Follow-Up: At [facility], you and your health needs are our priority.  As part of our continuing mission to provide you with exceptional heart care, our providers are all part of one team.  This team includes your primary Cardiologist (physician) and Advanced Practice Providers or APPs (Physician Assistants and Nurse Practitioners) who all work together to provide you with the care you need, when you need it.  Your next appointment:   6 month(s)  Provider:   Redell Shallow, MD

## 2024-03-27 ENCOUNTER — Other Ambulatory Visit: Payer: Self-pay | Admitting: Cardiology

## 2024-03-27 ENCOUNTER — Telehealth: Payer: Self-pay | Admitting: Cardiology

## 2024-03-27 DIAGNOSIS — I5022 Chronic systolic (congestive) heart failure: Secondary | ICD-10-CM

## 2024-03-27 NOTE — Telephone Encounter (Signed)
" °*  STAT* If patient is at the pharmacy, call can be transferred to refill team.   1. Which medications need to be refilled? (please list name of each medication and dose if known)   metoprolol  succinate (TOPROL -XL) 25 MG 24 hr tablet  2. Would you like to learn more about the convenience, safety, & potential cost savings by using the [facility]? no   3. Are you open to using the [facility] (Type [facility]. no   4. Which pharmacy/location (including street and city if local pharmacy) is medication to be sent to?  [id number] [place], [place] - [address]     5. Do they need a 30 day or 90 day supply? 90 day     "

## 2024-03-28 NOTE — Telephone Encounter (Signed)
"   Disp Refills Start End   metoprolol  succinate (TOPROL -XL) 25 MG 24 hr tablet 45 tablet 3 03/15/2024 --   Sig - Route: Take 0.5 tablets (12.5 mg total) by mouth daily. - Oral   Sent to pharmacy as: metoprolol  succinate (TOPROL -XL) 25 MG 24 hr tablet   Notes to Pharmacy: PT MUST KEEP UPCOMING APPT FOR FURTHER REFILLS.   E-Prescribing Status: Receipt confirmed by pharmacy (03/15/2024  1:58 PM EST)    "
